# Patient Record
Sex: Male | Born: 1975 | Race: White | Hispanic: No | Marital: Married | Smoking: Never smoker
Health system: Southern US, Community
[De-identification: ages and names within clinical notes are randomized; demographics above are authoritative.]

## PROBLEM LIST (undated history)

## (undated) DIAGNOSIS — N200 Calculus of kidney: Secondary | ICD-10-CM

## (undated) DIAGNOSIS — R51 Headache: Secondary | ICD-10-CM

## (undated) DIAGNOSIS — F419 Anxiety disorder, unspecified: Secondary | ICD-10-CM

## (undated) DIAGNOSIS — F32A Depression, unspecified: Secondary | ICD-10-CM

## (undated) DIAGNOSIS — Z87442 Personal history of urinary calculi: Secondary | ICD-10-CM

## (undated) DIAGNOSIS — F329 Major depressive disorder, single episode, unspecified: Secondary | ICD-10-CM

## (undated) DIAGNOSIS — G4730 Sleep apnea, unspecified: Secondary | ICD-10-CM

## (undated) DIAGNOSIS — I10 Essential (primary) hypertension: Secondary | ICD-10-CM

## (undated) DIAGNOSIS — E785 Hyperlipidemia, unspecified: Secondary | ICD-10-CM

## (undated) DIAGNOSIS — F988 Other specified behavioral and emotional disorders with onset usually occurring in childhood and adolescence: Secondary | ICD-10-CM

## (undated) DIAGNOSIS — R519 Headache, unspecified: Secondary | ICD-10-CM

## (undated) DIAGNOSIS — E039 Hypothyroidism, unspecified: Secondary | ICD-10-CM

## (undated) HISTORY — PX: HERNIA REPAIR: SHX51

## (undated) HISTORY — DX: Other specified behavioral and emotional disorders with onset usually occurring in childhood and adolescence: F98.8

## (undated) HISTORY — PX: WISDOM TOOTH EXTRACTION: SHX21

## (undated) HISTORY — PX: NASAL SINUS SURGERY: SHX719

---
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
## 2001-04-25 ENCOUNTER — Ambulatory Visit (HOSPITAL_BASED_OUTPATIENT_CLINIC_OR_DEPARTMENT_OTHER): Admission: RE | Admit: 2001-04-25 | Discharge: 2001-04-25 | Payer: Self-pay | Admitting: *Deleted

## 2001-09-03 ENCOUNTER — Encounter: Payer: Self-pay | Admitting: Emergency Medicine

## 2001-09-03 ENCOUNTER — Emergency Department (HOSPITAL_COMMUNITY): Admission: EM | Admit: 2001-09-03 | Discharge: 2001-09-03 | Payer: Self-pay | Admitting: Emergency Medicine

## 2004-03-06 ENCOUNTER — Ambulatory Visit: Payer: Self-pay | Admitting: Internal Medicine

## 2004-12-12 ENCOUNTER — Ambulatory Visit: Payer: Self-pay | Admitting: Internal Medicine

## 2005-02-17 ENCOUNTER — Ambulatory Visit: Payer: Self-pay | Admitting: Internal Medicine

## 2005-08-14 ENCOUNTER — Ambulatory Visit: Payer: Self-pay | Admitting: Internal Medicine

## 2005-08-28 ENCOUNTER — Ambulatory Visit: Payer: Self-pay | Admitting: Internal Medicine

## 2005-09-24 ENCOUNTER — Ambulatory Visit: Payer: Self-pay | Admitting: Internal Medicine

## 2006-08-17 ENCOUNTER — Ambulatory Visit: Payer: Self-pay | Admitting: Internal Medicine

## 2006-08-18 LAB — CONVERTED CEMR LAB
Basophils Absolute: 0 10*3/uL (ref 0.0–0.1)
Basophils Relative: 0.4 % (ref 0.0–1.0)
Eosinophils Absolute: 0.1 10*3/uL (ref 0.0–0.6)
Eosinophils Relative: 0.9 % (ref 0.0–5.0)
Free T4: 0.8 ng/dL (ref 0.6–1.6)
HCT: 46.4 % (ref 39.0–52.0)
Hemoglobin: 15.9 g/dL (ref 13.0–17.0)
Hgb A1c MFr Bld: 5.3 % (ref 4.6–6.0)
Lymphocytes Relative: 38.7 % (ref 12.0–46.0)
MCHC: 34.4 g/dL (ref 30.0–36.0)
MCV: 88.3 fL (ref 78.0–100.0)
Monocytes Absolute: 0.8 10*3/uL — ABNORMAL HIGH (ref 0.2–0.7)
Monocytes Relative: 9.9 % (ref 3.0–11.0)
Neutro Abs: 4.1 10*3/uL (ref 1.4–7.7)
Neutrophils Relative %: 50.1 % (ref 43.0–77.0)
Platelets: 345 10*3/uL (ref 150–400)
RBC: 5.25 M/uL (ref 4.22–5.81)
RDW: 12.5 % (ref 11.5–14.6)
T3 Uptake Ratio: 41.7 % — ABNORMAL HIGH (ref 22.5–37.0)
T4, Total: 7.3 ug/dL (ref 5.0–12.5)
TSH: 1.9 microintl units/mL (ref 0.35–5.50)
WBC: 8.2 10*3/uL (ref 4.5–10.5)

## 2006-09-29 ENCOUNTER — Ambulatory Visit: Payer: Self-pay | Admitting: Internal Medicine

## 2007-12-13 ENCOUNTER — Telehealth: Payer: Self-pay | Admitting: Internal Medicine

## 2008-01-10 ENCOUNTER — Encounter: Payer: Self-pay | Admitting: Internal Medicine

## 2008-02-08 ENCOUNTER — Ambulatory Visit (HOSPITAL_BASED_OUTPATIENT_CLINIC_OR_DEPARTMENT_OTHER): Admission: RE | Admit: 2008-02-08 | Discharge: 2008-02-08 | Payer: Self-pay | Admitting: Otolaryngology

## 2008-02-11 ENCOUNTER — Ambulatory Visit: Payer: Self-pay | Admitting: Internal Medicine

## 2008-04-11 ENCOUNTER — Encounter: Payer: Self-pay | Admitting: Internal Medicine

## 2008-07-02 ENCOUNTER — Encounter: Payer: Self-pay | Admitting: *Deleted

## 2008-07-02 ENCOUNTER — Telehealth: Payer: Self-pay | Admitting: *Deleted

## 2009-02-11 ENCOUNTER — Encounter (INDEPENDENT_AMBULATORY_CARE_PROVIDER_SITE_OTHER): Payer: Self-pay | Admitting: *Deleted

## 2009-02-12 ENCOUNTER — Ambulatory Visit: Payer: Self-pay | Admitting: Internal Medicine

## 2009-02-12 LAB — CONVERTED CEMR LAB
ALT: 24 units/L (ref 0–53)
AST: 22 units/L (ref 0–37)
Albumin: 4.4 g/dL (ref 3.5–5.2)
Alkaline Phosphatase: 56 units/L (ref 39–117)
BUN: 19 mg/dL (ref 6–23)
Basophils Absolute: 0 10*3/uL (ref 0.0–0.1)
Basophils Relative: 0.6 % (ref 0.0–3.0)
Bilirubin, Direct: 0 mg/dL (ref 0.0–0.3)
Blood in Urine, dipstick: NEGATIVE
CO2: 27 meq/L (ref 19–32)
Calcium: 9 mg/dL (ref 8.4–10.5)
Chloride: 108 meq/L (ref 96–112)
Cholesterol: 203 mg/dL — ABNORMAL HIGH (ref 0–200)
Creatinine, Ser: 1 mg/dL (ref 0.4–1.5)
Direct LDL: 162.1 mg/dL
Eosinophils Absolute: 0 10*3/uL (ref 0.0–0.7)
Eosinophils Relative: 0.7 % (ref 0.0–5.0)
GFR calc non Af Amer: 92.61 mL/min (ref >60)
Glucose, Bld: 88 mg/dL (ref 70–99)
Glucose, Urine, Semiquant: NEGATIVE
HCT: 44.9 % (ref 39.0–52.0)
HDL: 28.5 mg/dL — ABNORMAL LOW (ref >39.00)
Hemoglobin: 15.2 g/dL (ref 13.0–17.0)
Lymphocytes Relative: 40.7 % (ref 12.0–46.0)
Lymphs Abs: 2.6 10*3/uL (ref 0.7–4.0)
MCHC: 33.8 g/dL (ref 30.0–36.0)
MCV: 91.6 fL (ref 78.0–100.0)
Monocytes Absolute: 0.6 10*3/uL (ref 0.1–1.0)
Monocytes Relative: 10.1 % (ref 3.0–12.0)
Neutro Abs: 3.1 10*3/uL (ref 1.4–7.7)
Neutrophils Relative %: 47.9 % (ref 43.0–77.0)
Nitrite: NEGATIVE
Platelets: 252 10*3/uL (ref 150.0–400.0)
Potassium: 4.5 meq/L (ref 3.5–5.1)
RBC: 4.9 M/uL (ref 4.22–5.81)
RDW: 12.2 % (ref 11.5–14.6)
Sodium: 144 meq/L (ref 135–145)
Specific Gravity, Urine: >=1.03
TSH: 1.81 microintl units/mL (ref 0.35–5.50)
Total Bilirubin: 1.1 mg/dL (ref 0.3–1.2)
Total CHOL/HDL Ratio: 7
Total Protein: 6.8 g/dL (ref 6.0–8.3)
Triglycerides: 110 mg/dL (ref 0.0–149.0)
Urobilinogen, UA: 0.2
VLDL: 22 mg/dL (ref 0.0–40.0)
WBC Urine, dipstick: NEGATIVE
WBC: 6.3 10*3/uL (ref 4.5–10.5)
pH: 5.5

## 2009-02-18 ENCOUNTER — Ambulatory Visit: Payer: Self-pay | Admitting: Internal Medicine

## 2009-02-28 ENCOUNTER — Encounter (INDEPENDENT_AMBULATORY_CARE_PROVIDER_SITE_OTHER): Payer: Self-pay | Admitting: *Deleted

## 2009-03-06 ENCOUNTER — Ambulatory Visit (HOSPITAL_COMMUNITY): Admission: RE | Admit: 2009-03-06 | Discharge: 2009-03-08 | Payer: Self-pay | Admitting: Otolaryngology

## 2009-03-15 ENCOUNTER — Encounter (INDEPENDENT_AMBULATORY_CARE_PROVIDER_SITE_OTHER): Payer: Self-pay | Admitting: *Deleted

## 2009-04-05 ENCOUNTER — Encounter: Payer: Self-pay | Admitting: Internal Medicine

## 2009-05-14 ENCOUNTER — Encounter: Payer: Self-pay | Admitting: Internal Medicine

## 2010-02-17 ENCOUNTER — Encounter: Payer: Self-pay | Admitting: Internal Medicine

## 2010-03-28 ENCOUNTER — Emergency Department (HOSPITAL_COMMUNITY)
Admission: EM | Admit: 2010-03-28 | Discharge: 2010-03-28 | Payer: Self-pay | Source: Home / Self Care | Admitting: Emergency Medicine

## 2010-04-29 NOTE — Letter (Signed)
Summary: [facility]  [facility]   Imported By: Maryln Gottron 04/10/2009 12:53:00  _____________________________________________________________________  External Attachment:    Type:   Image     Comment:   External Document

## 2010-04-29 NOTE — Letter (Signed)
Summary: [place]  [facility] Provider: This provider was preselected by the workflow.  Signature: The signature status of this document was preset by the workflow  Processed by [employer] Local Indexer Client @ Thursday, February 14, 2009 2:03:55 PM using version:2010.1.2.11(2.4)   Manually Indexed By: [id number]  idlBatchDetail: [id number]   _____________________________________________________________________  External Attachment:    Type:   Image     Comment:   External Document

## 2010-04-29 NOTE — Progress Notes (Signed)
Summary: Cut tip of finger/Tetnus  Phone Note Call from Patient Call back at [phone]   Caller: Patient Call For: Lovell Sheehan Reason for Call: Acute Illness, Talk to Nurse, Insurance Question Summary of Call: Yesterday patient cut the tip of his finger off and is in alot of pain and discomfort.  Wants to be evaluated and believes he needs a Tetnus shot. Initial call taken by: Barnie Mort,  July 02, 2008 9:46 AM  Follow-up for Phone Call        tetanus shot is up to date 2003--called pt - left message on machine how much was cut off- doe3s he need to go to hand surgeon or will cleaning and taping be ok- since over 24 hours nothing we can do.  Follow-up by: Willy Eddy, LPN,  July 02, 2008 10:16 AM

## 2010-04-29 NOTE — Letter (Signed)
Summary: [facility]  [facility]   Imported By: Maryln Gottron 03/06/2010 13:13:42  _____________________________________________________________________  External Attachment:    Type:   Image     Comment:   External Document

## 2010-04-29 NOTE — Miscellaneous (Signed)
Summary: Orders Update  Clinical Lists Changes  Observations: Added new observation of TD BOOSTER: Historical (07/01/2001 10:17)         Immunization History:  Tetanus/Td Immunization History:    Tetanus/Td:  historical (07/01/2001)

## 2010-04-29 NOTE — Assessment & Plan Note (Signed)
Summary: cpx/cjr   Vital Signs:  Patient profile:   35 year old male Height:      74 inches Weight:      304 pounds BMI:     39.17 Temp:     98.2 degrees F oral Pulse rate:   68 / minute Resp:     14 per minute BP sitting:   130 / 80  (left arm) Cuff size:   large  Vitals Entered By: Willy Eddy, LPN (February 18, 2009 3:22 PM)  CC:  cpx.  History of Present Illness: The pt was asked about all immunizations, health maint. services that are appropriate to their age and was given guidance on diet exercize  and weight management   Preventive Screening-Counseling & Management  Alcohol-Tobacco     Smoking Status: never  Caffeine-Diet-Exercise     Does Patient Exercise: no      Drug Use:  no.    Problems Prior to Update: 1)  Health Screening  (ICD-V70.0)  Medications Prior to Update: 1)  Alprazolam 0.25 Mg Tabs (Alprazolam) .... One By Mouth Three Times A Day As Needed  Current Medications (verified): 1)  Alprazolam 0.25 Mg Tabs (Alprazolam) .... One By Mouth Three Times A Day As Needed  Past History:  Past medical, surgical, family and social histories (including risk factors) reviewed, and no changes noted (except as noted below).  Family History: Reviewed history and no changes required. Family History High cholesterol  Social History: Reviewed history and no changes required. Married Never Smoked Drug use-no Regular exercise-no Smoking Status:  never Drug Use:  no Does Patient Exercise:  no  Review of Systems  The patient denies anorexia, fever, weight loss, weight gain, vision loss, decreased hearing, hoarseness, chest pain, syncope, dyspnea on exertion, peripheral edema, prolonged cough, headaches, hemoptysis, abdominal pain, melena, hematochezia, severe indigestion/heartburn, hematuria, incontinence, genital sores, muscle weakness, suspicious skin lesions, transient blindness, difficulty walking, depression, unusual weight change, abnormal  bleeding, enlarged lymph nodes, angioedema, breast masses, and testicular masses.    Contraindications/Deferment of Procedures/Staging:    Test/Procedure: FLU VAX    Reason for deferment: patient declined   Physical Exam  General:  Well-developed,well-nourished,in no acute distress; alert,appropriate and cooperative throughout examination Head:  normocephalic and male-pattern balding.   Eyes:  No corneal or conjunctival inflammation noted. EOMI. Perrla. Funduscopic exam benign, without hemorrhages, exudates or papilledema. Vision grossly normal. Ears:  External ear exam shows no significant lesions or deformities.  Otoscopic examination reveals clear canals, tympanic membranes are intact bilaterally without bulging, retraction, inflammation or discharge. Hearing is grossly normal bilaterally. Nose:  no external deformity and no nasal discharge.   Mouth:  good dentition and pharynx pink and moist.   Neck:  No deformities, masses, or tenderness noted. Breasts:  gynecomastia.   Lungs:  no intercostal retractions and no accessory muscle use.   Heart:  normal rate and regular rhythm.   Abdomen:  Bowel sounds positive,abdomen soft and non-tender without masses, organomegaly or hernias noted. Genitalia:  circumcised and no urethral discharge.   Prostate:  no gland enlargement and no asymmetry.   Msk:  normal ROM, no joint tenderness, no joint swelling, and no joint warmth.   Extremities:  No clubbing, cyanosis, edema, or deformity noted with normal full range of motion of all joints.   Neurologic:  No cranial nerve deficits noted. Station and gait are normal. Plantar reflexes are down-going bilaterally. DTRs are symmetrical throughout. Sensory, motor and coordinative functions appear intact.   Impression &  Recommendations:  Problem # 1:  Preventive Health Care (ICD-V70.0) The pt was asked about all immunizations, health maint. services that are appropriate to their age and was given guidance  on diet exercize  and weight management  Td Booster: Historical (07/01/2001)   Chol: 203 (02/12/2009)   HDL: 28.50 (02/12/2009)   TG: 110.0 (02/12/2009) TSH: 1.81 (02/12/2009)   HgbA1C: 5.3 (08/18/2006)    Discussed using sunscreen, use of alcohol, drug use, self testicular exam, routine dental care, routine eye care, routine physical exam, seat belts, multiple vitamins, osteoporosis prevention, adequate calcium intake in diet, and recommendations for immunizations.  Discussed exercise and checking cholesterol.  Discussed gun safety, safe sex, and contraception. Also recommend checking PSA.  Complete Medication List: 1)  Alprazolam 0.25 Mg Tabs (Alprazolam) .... One by mouth three times a day as needed  Other Orders: EKG w/ Interpretation (93000)  Patient Instructions: 1)  weigtht loss 2)  red rice yeast suppliment 3)  add fiber to diet and cut out some of the  carbs and fat 4)  Please schedule a follow-up appointment in 6 months. 5)  Hepatic Panel prior to visit, ICD-9:995.20 6)  Lipid Panel prior to visit, ICD-9:272.4 7)  dash diet [email or web address]

## 2010-04-29 NOTE — Letter (Signed)
Summary: [facility]  [facility] Provider: This provider was preselected by the workflow.  Signature: The signature status of this document was preset by the workflow  Processed by [employer] Local Indexer Client @ Wednesday, March 20, 2009 10:57:28 AM using version:2010.1.2.11(2.4)   Manually Indexed By: [id number]  idlBatchDetail: [id number]   _____________________________________________________________________  External Attachment:    Type:   Image     Comment:   External Document

## 2010-04-29 NOTE — Consult Note (Signed)
Summary: [facility]  [facility]   Imported By: Maryln Gottron 04/18/2008 14:40:55  _____________________________________________________________________  External Attachment:    Type:   Image     Comment:   External Document

## 2010-04-29 NOTE — Letter (Signed)
Summary: [place] [facility]  [facility] Provider: This provider was preselected by the workflow.  Signature: The signature status of this document was preset by the workflow  Processed by [employer] Local Indexer Client @ Thursday, March 07, 2009 10:04:42 AM using version:2010.1.2.11(2.4)   Manually Indexed By: [id number]  idlBatchDetail: [id number]   _____________________________________________________________________  External Attachment:    Type:   Image     Comment:   External Document

## 2010-04-29 NOTE — Letter (Signed)
Summary: [facility]  [facility]   Imported By: Maryln Gottron 05/21/2009 10:57:57  _____________________________________________________________________  External Attachment:    Type:   Image     Comment:   External Document

## 2010-05-14 ENCOUNTER — Ambulatory Visit (INDEPENDENT_AMBULATORY_CARE_PROVIDER_SITE_OTHER): Payer: 59 | Admitting: Internal Medicine

## 2010-05-14 ENCOUNTER — Encounter: Payer: Self-pay | Admitting: Internal Medicine

## 2010-05-14 VITALS — BP 130/80 | HR 76 | Temp 98.8°F | Resp 14 | Ht 75.0 in | Wt 250.0 lb

## 2010-05-14 DIAGNOSIS — J0110 Acute frontal sinusitis, unspecified: Secondary | ICD-10-CM

## 2010-05-14 DIAGNOSIS — J0100 Acute maxillary sinusitis, unspecified: Secondary | ICD-10-CM

## 2010-05-14 DIAGNOSIS — J069 Acute upper respiratory infection, unspecified: Secondary | ICD-10-CM

## 2010-05-14 MED ORDER — LEVOFLOXACIN 500 MG PO TABS: 500.0000 mg | ORAL_TABLET | Freq: Every day | ORAL | Status: DC

## 2010-05-14 MED ORDER — LEVOFLOXACIN 500 MG PO TABS: 500.0000 mg | ORAL_TABLET | Freq: Every day | ORAL | Status: AC

## 2010-05-14 MED ORDER — METHYLPREDNISOLONE (PAK) 4 MG PO TABS: 4.0000 mg | ORAL_TABLET | Freq: Every day | ORAL | Status: DC

## 2010-05-14 MED ORDER — SALINE SPRAY 0.65 % NA SOLN: 1.0000 | NASAL | Status: AC | PRN

## 2010-05-14 NOTE — Progress Notes (Signed)
  Subjective:     Robert [name] is a 35 y.o. male who presents for evaluation of symptoms of a URI, follow up on a URI. Symptoms include lightheadedness, non productive cough and productive cough with  yellow colored sputum. Onset of symptoms was 4 months ago, and has been gradually worsening since that time. Treatment to date: antibiotics, antihistamines, cough suppressants and decongestants.  The following portions of the patient's history were reviewed and updated as appropriate: allergies, current medications, past family history, past medical history, past social history, past surgical history and problem list.  Review of Systems Constitutional: negative Eyes: positive for irritation, redness and visual disturbance Ears, nose, mouth, throat, and face: positive for ear drainage, hoarseness, nasal congestion and voice change Respiratory: positive for cough and sputum Gastrointestinal: positive for constipation Allergic/Immunologic: positive for hay fever   Objective:    General appearance: alert, mild distress and mildly obese Eyes: positive findings: eyelids/periorbital: normal Ears: normal TM's and external ear canals both ears Nose: Nares normal. Septum midline. Mucosa normal. No drainage or sinus tenderness., turbinates red, edematous, inflamed, no sinus tenderness, sinus tenderness bilateral Throat: lips, mucosa, and tongue normal; teeth and gums normal Lungs: clear to auscultation bilaterally Abdomen: soft, non-tender; bowel sounds normal; no masses,  no organomegaly Skin: Skin color, texture, turgor normal. No rashes or lesions Neurologic: Grossly normal   Assessment:    sinusitis   Plan:    Discussed the diagnosis and treatment of sinusitis. Suggested symptomatic OTC remedies. Nasal saline spray for congestion. Nasal steroids per orders. Follow up as needed.

## 2010-06-09 LAB — POCT I-STAT, CHEM 8
BUN: 15 mg/dL (ref 6–23)
Calcium, Ion: 1.1 mmol/L — ABNORMAL LOW (ref 1.12–1.32)
Chloride: 108 mEq/L (ref 96–112)
Creatinine, Ser: 1.1 mg/dL (ref 0.4–1.5)
Glucose, Bld: 96 mg/dL (ref 70–99)
HCT: 45 % (ref 39.0–52.0)
Hemoglobin: 15.3 g/dL (ref 13.0–17.0)
Potassium: 4.1 mEq/L (ref 3.5–5.1)
Sodium: 141 mEq/L (ref 135–145)
TCO2: 23 mmol/L (ref 0–100)

## 2010-07-01 LAB — CBC
HCT: 44.9 % (ref 39.0–52.0)
Hemoglobin: 15.7 g/dL (ref 13.0–17.0)
MCHC: 34.9 g/dL (ref 30.0–36.0)
MCV: 88.5 fL (ref 78.0–100.0)
Platelets: 288 10*3/uL (ref 150–400)
RBC: 5.07 MIL/uL (ref 4.22–5.81)
RDW: 12.9 % (ref 11.5–15.5)
WBC: 6.7 10*3/uL (ref 4.0–10.5)

## 2010-07-01 LAB — BASIC METABOLIC PANEL
BUN: 15 mg/dL (ref 6–23)
CO2: 25 mEq/L (ref 19–32)
Calcium: 9.3 mg/dL (ref 8.4–10.5)
Chloride: 107 mEq/L (ref 96–112)
Creatinine, Ser: 1.05 mg/dL (ref 0.4–1.5)
GFR calc Af Amer: >60 mL/min (ref >60)
GFR calc non Af Amer: >60 mL/min (ref >60)
Glucose, Bld: 91 mg/dL (ref 70–99)
Potassium: 4.5 mEq/L (ref 3.5–5.1)
Sodium: 139 mEq/L (ref 135–145)

## 2010-07-07 ENCOUNTER — Encounter: Payer: Self-pay | Admitting: Internal Medicine

## 2010-07-07 ENCOUNTER — Telehealth: Payer: Self-pay | Admitting: *Deleted

## 2010-07-07 ENCOUNTER — Ambulatory Visit (INDEPENDENT_AMBULATORY_CARE_PROVIDER_SITE_OTHER): Payer: 59 | Admitting: Internal Medicine

## 2010-07-07 VITALS — BP 120/80 | HR 72 | Temp 98.5°F | Wt 294.0 lb

## 2010-07-07 DIAGNOSIS — R197 Diarrhea, unspecified: Secondary | ICD-10-CM

## 2010-07-07 NOTE — Progress Notes (Signed)
  Subjective:    Patient ID: Robert [name], male    DOB: 08-09-1975, 35 y.o.   MRN: [id number]  HPI Patient comes in today for an acute visit. He had a very stressful day at work on April 4 and soon after that began having stomach issues however on April 6 had fever chills probable fever and rigers and associated profuse,  frequent watery diarrhea without blood. He had some abdominal cramps but otherwise no pain nausea no vomiting. No known exposures travel or undercooked food.  Since that time he has taken Imodium for the last couple days one yesterday and one today and his stool frequency has significantly decreased. He was able to eat a [place] sandwich and some rice today without significant side effects. No cure and fever and appetite is better.  Treated in late February for respiratory infection with Levaquin. Otherwise the recent antibiotics. 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  Review of Systems No chest pain shortness of breath current cough changes in skin blood in stool. Rest as per history of present illness.    Objective:   Physical Exam Well-developed well-nourished in no acute distress HEENT is grossly normal he is nonicteric. Neck no masses or tenderness Chest:  Clear to A&P without wheezes rales or rhonchi CV:  S1-S2 no gallops or murmurs peripheral perfusion is normal Abdomen:  Sof,t normal bowel sounds without hepatosplenomegaly, no guarding rebound or masses no CVA tenderness. Skin no acute changes rashes or icterus      Assessment & Plan:  Diarrhea  Most likely infectious acute enteritis that is improving. No alarm features except that he was on Levaquin in the end of February. But he is in proving his situation at present will not get stool tests but low threshold to get stool tests to check for C. difficile etc. Expectant management and symptomatic treatment.  Patient aware to call back if not continuing to improve or are persistent and progressive  symptoms next week.

## 2010-07-07 NOTE — Telephone Encounter (Signed)
Appt made

## 2010-07-07 NOTE — Patient Instructions (Signed)
This acts like an acute gastroenteritis most likely from a virus could be bacterial.  Expect some bowel irritability over the next week and then resolution of your symptoms. The Imodium is okay to take as needed but does not cure the problem.  If you are relapsing fever or blood in her stool or very severe symptoms call and we will plan to do stool cultures and tests.   Avoid fruit juices high sugar content foods.

## 2010-08-12 NOTE — Procedures (Signed)
NAME:  Robert [name], Robert [name]              ACCOUNT NO.:  [id number]   MEDICAL RECORD NO.:  [id number]          PATIENT TYPE:  OUT   LOCATION:  SLEEP CENTER                 FACILITY:  [facility]   PHYSICIAN:  Clinton D. Maple Hudson, MD, FCCP, FACPDATE OF BIRTH:  1977/10/25   DATE OF STUDY:  02/08/2008                            NOCTURNAL POLYSOMNOGRAM   REFERRING PHYSICIAN:  Zola Button T. Lazarus Salines, M.D.   INDICATION FOR STUDY:  Hypersomnia with sleep apnea.   EPWORTH SLEEPINESS SCORE:  Epworth sleepiness score 16/24.  BMI 35.  Weight 280 pounds.  Height 75 inches.  Neck 18 inches.   MEDICATIONS:  Home medications listed as none.   SLEEP ARCHITECTURE:  A baseline diagnostic NPSG on April 25, 2001, had  recorded an AHI of 27 per hour.  During the diagnostic phase, total  sleep time was 431 minutes with sleep efficiency 75.8%.  Stage I was  5.7%.  Stage II 94.3%.  Stage III and REM were absent.  Sleep latency 32  minutes.  Wake after sleep onset 7.5 minutes.  Arousal index 32.4.  No  bedtime medication was taken.   RESPIRATORY DATA:  Split-study protocol.  Apnea-hypopnea index (AHI)  70.7 per hour.  A total of 155 events were scored including 60  obstructive apneas, 12 central apneas, 8 mixed apneas, and 75 hypopneas  before CPAP titration.  Events were present in all sleep positions, but  more common while supine.  REM AHI 0/NA.  CPAP was titrated to 15 CWP,  AHI 0 per hour.  He chose a medium ResMed Mirage Quattro full face mask  with heated humidifier.   OXYGEN DATA:  Moderate snoring with oxygen desaturation to a nadir of  84% before CPAP.  After the CPAP control, mean oxygen saturation held  96.4% on room air.   CARDIAC DATA:  Normal sinus rhythm.   MOVEMENT-PARASOMNIA:  No significant movement disturbances.  No bathroom  trips.   IMPRESSIONS-RECOMMENDATIONS:  1. Severe obstructive sleep apnea/hypopnea syndrome, AHI 70.7 per hour      with events more common while supine, but present in all  positions.      Moderate snoring with oxygen desaturation to a nadir of 84%.  2. Successful CPAP titration to 15 CWP, AHI 0 per hour.  He chose a      medium ResMed Mirage Quattro full face      mask with heated humidifier.  3. A baseline diagnostic study was recorded on April 25, 2001, AHI      27 per hour.      Clinton D. Maple Hudson, MD, [facility], FACP  Diplomate, Biomedical engineer of Sleep Medicine  Electronically Signed     CDY/MEDQ  D:  02/11/2008 16:02:15  T:  02/12/2008 03:51:15  Job:  [id number]

## 2010-10-29 ENCOUNTER — Other Ambulatory Visit: Payer: 59

## 2010-10-30 ENCOUNTER — Other Ambulatory Visit (INDEPENDENT_AMBULATORY_CARE_PROVIDER_SITE_OTHER): Payer: 59

## 2010-10-30 DIAGNOSIS — Z0000 Encounter for general adult medical examination without abnormal findings: Secondary | ICD-10-CM

## 2010-10-30 LAB — HEPATIC FUNCTION PANEL
ALT: 20 U/L (ref 0–53)
AST: 17 U/L (ref 0–37)
Albumin: 4.8 g/dL (ref 3.5–5.2)
Alkaline Phosphatase: 52 U/L (ref 39–117)
Bilirubin, Direct: 0 mg/dL (ref 0.0–0.3)
Total Bilirubin: 0.7 mg/dL (ref 0.3–1.2)
Total Protein: 6.9 g/dL (ref 6.0–8.3)

## 2010-10-30 LAB — POCT URINALYSIS DIPSTICK
Bilirubin, UA: NEGATIVE
Blood, UA: NEGATIVE
Glucose, UA: NEGATIVE
Ketones, UA: NEGATIVE
Leukocytes, UA: NEGATIVE
Nitrite, UA: NEGATIVE
Protein, UA: NEGATIVE
Spec Grav, UA: 1.025
Urobilinogen, UA: 0.2
pH, UA: 5

## 2010-10-30 LAB — LIPID PANEL
Cholesterol: 191 mg/dL (ref 0–200)
HDL: 37.7 mg/dL — ABNORMAL LOW (ref >39.00)
LDL Cholesterol: 134 mg/dL — ABNORMAL HIGH (ref 0–99)
Total CHOL/HDL Ratio: 5
Triglycerides: 98 mg/dL (ref 0.0–149.0)
VLDL: 19.6 mg/dL (ref 0.0–40.0)

## 2010-10-30 LAB — CBC WITH DIFFERENTIAL/PLATELET
Basophils Absolute: 0 10*3/uL (ref 0.0–0.1)
Basophils Relative: 0.6 % (ref 0.0–3.0)
Eosinophils Absolute: 0 10*3/uL (ref 0.0–0.7)
Eosinophils Relative: 0.6 % (ref 0.0–5.0)
HCT: 46.7 % (ref 39.0–52.0)
Hemoglobin: 15.5 g/dL (ref 13.0–17.0)
Lymphocytes Relative: 44 % (ref 12.0–46.0)
Lymphs Abs: 3.2 10*3/uL (ref 0.7–4.0)
MCHC: 33.2 g/dL (ref 30.0–36.0)
MCV: 88.9 fl (ref 78.0–100.0)
Monocytes Absolute: 0.7 10*3/uL (ref 0.1–1.0)
Monocytes Relative: 9.2 % (ref 3.0–12.0)
Neutro Abs: 3.3 10*3/uL (ref 1.4–7.7)
Neutrophils Relative %: 45.6 % (ref 43.0–77.0)
Platelets: 284 10*3/uL (ref 150.0–400.0)
RBC: 5.25 Mil/uL (ref 4.22–5.81)
RDW: 13.3 % (ref 11.5–14.6)
WBC: 7.2 10*3/uL (ref 4.5–10.5)

## 2010-10-30 LAB — BASIC METABOLIC PANEL
BUN: 19 mg/dL (ref 6–23)
CO2: 24 mEq/L (ref 19–32)
Calcium: 9.1 mg/dL (ref 8.4–10.5)
Chloride: 105 mEq/L (ref 96–112)
Creatinine, Ser: 1 mg/dL (ref 0.4–1.5)
GFR: 89.54 mL/min (ref >60.00)
Glucose, Bld: 83 mg/dL (ref 70–99)
Potassium: 4.1 mEq/L (ref 3.5–5.1)
Sodium: 141 mEq/L (ref 135–145)

## 2010-10-30 LAB — TSH: TSH: 2.81 u[IU]/mL (ref 0.35–5.50)

## 2010-11-05 ENCOUNTER — Encounter: Payer: Self-pay | Admitting: Internal Medicine

## 2010-11-05 ENCOUNTER — Ambulatory Visit (INDEPENDENT_AMBULATORY_CARE_PROVIDER_SITE_OTHER): Payer: 59 | Admitting: Internal Medicine

## 2010-11-05 DIAGNOSIS — F411 Generalized anxiety disorder: Secondary | ICD-10-CM

## 2010-11-05 DIAGNOSIS — Z0000 Encounter for general adult medical examination without abnormal findings: Secondary | ICD-10-CM

## 2010-11-05 DIAGNOSIS — F419 Anxiety disorder, unspecified: Secondary | ICD-10-CM

## 2010-11-05 MED ORDER — ALPRAZOLAM 0.25 MG PO TABS: 0.2500 mg | ORAL_TABLET | Freq: Three times a day (TID) | ORAL | Status: DC | PRN

## 2010-11-05 NOTE — Progress Notes (Signed)
  Subjective:    Patient ID: Robert [name], male    DOB: 1976/01/02, 35 y.o.   MRN: [id number]  HPI  welness  Review of Systems  Constitutional: Negative for fever and fatigue.  HENT: Negative for hearing loss, congestion, neck pain and postnasal drip.   Eyes: Negative for discharge, redness and visual disturbance.  Respiratory: Negative for cough, shortness of breath and wheezing.   Cardiovascular: Negative for leg swelling.  Gastrointestinal: Negative for abdominal pain, constipation and abdominal distention.  Genitourinary: Negative for urgency and frequency.  Musculoskeletal: Negative for joint swelling and arthralgias.  Skin: Negative for color change and rash.  Neurological: Negative for weakness and light-headedness.  Hematological: Negative for adenopathy.  Psychiatric/Behavioral: Negative for behavioral problems.   Past Medical History  Diagnosis Date  . Chronic sinusitis    Past Surgical History  Procedure Date  . Nasal sinus surgery     reports that he has never smoked. He does not have any smokeless tobacco history on file. He reports that he drinks alcohol. He reports that he does not use illicit drugs. family history includes Hyperlipidemia in an unspecified family member and Hypertension in his mother. Allergies  Allergen Reactions  . Penicillins Anaphylaxis       Objective:   Physical Exam  Nursing note and vitals reviewed. Constitutional: He appears well-developed and well-nourished.  HENT:  Head: Normocephalic and atraumatic.  Eyes: Conjunctivae are normal. Pupils are equal, round, and reactive to light.  Neck: Normal range of motion. Neck supple.  Cardiovascular: Normal rate and regular rhythm.   Pulmonary/Chest: Effort normal and breath sounds normal.  Abdominal: Soft. Bowel sounds are normal.  Musculoskeletal: Normal range of motion.  Neurological: He is alert.  Skin: Skin is warm and dry.  Psychiatric: His behavior is normal.            Assessment & Plan:   Patient presents for yearly preventative medicine examination.   all immunizations and health maintenance protocols were reviewed with the patient and they are up to date with these protocols.   screening laboratory values were reviewed with the patient including screening of hyperlipidemia PSA renal function and hepatic function.   There medications past medical history social history problem list and allergies were reviewed in detail.   Goals were established with regard to weight loss exercise diet in compliance with medications

## 2011-03-04 ENCOUNTER — Telehealth: Payer: Self-pay | Admitting: Internal Medicine

## 2011-03-04 MED ORDER — PHENTERMINE HCL 37.5 MG PO TABS: 37.5000 mg | ORAL_TABLET | Freq: Every day | ORAL | Status: AC

## 2011-03-04 NOTE — Telephone Encounter (Signed)
Pt called and has sch an ov for 04/22/11 for med check/refill ov. Pt is going to need a refill of Phentermine 37.5 mg half a tab qd to [facility] on [address], to last until pts ov in January.   Also pt is needing to get script for Topiramate sent over to [facility] as well. Pt said that Dr Lovell Sheehan recommended this med for pt during an ov that pts mom had with Dr Lovell Sheehan.

## 2011-03-04 NOTE — Telephone Encounter (Signed)
Ok to call in per dr jenkins-pt informed

## 2011-04-06 ENCOUNTER — Telehealth: Payer: Self-pay | Admitting: Internal Medicine

## 2011-04-06 NOTE — Telephone Encounter (Signed)
Pt has ov sch for 04-22-2011. Pt would like to come in sooner for med check.

## 2011-04-06 NOTE — Telephone Encounter (Signed)
Pt informed none available- will call if anything becomes available

## 2011-04-22 ENCOUNTER — Ambulatory Visit: Payer: 59 | Admitting: Internal Medicine

## 2011-04-24 ENCOUNTER — Encounter: Payer: Self-pay | Admitting: Internal Medicine

## 2011-04-24 ENCOUNTER — Ambulatory Visit (INDEPENDENT_AMBULATORY_CARE_PROVIDER_SITE_OTHER): Payer: 59 | Admitting: Internal Medicine

## 2011-04-24 VITALS — BP 130/80 | HR 76 | Temp 98.2°F | Resp 16 | Ht 75.0 in | Wt 282.0 lb

## 2011-04-24 DIAGNOSIS — R635 Abnormal weight gain: Secondary | ICD-10-CM

## 2011-04-24 DIAGNOSIS — Z202 Contact with and (suspected) exposure to infections with a predominantly sexual mode of transmission: Secondary | ICD-10-CM

## 2011-04-24 DIAGNOSIS — E669 Obesity, unspecified: Secondary | ICD-10-CM

## 2011-04-24 MED ORDER — TOPIRAMATE 25 MG PO TABS: 25.0000 mg | ORAL_TABLET | Freq: Two times a day (BID) | ORAL | Status: DC

## 2011-04-24 MED ORDER — PHENTERMINE HCL 37.5 MG PO CAPS: ORAL_CAPSULE | ORAL | Status: DC

## 2011-04-24 NOTE — Patient Instructions (Signed)
The patient is instructed to continue all medications as prescribed. Schedule followup with check out clerk upon leaving the clinic  

## 2011-04-24 NOTE — Progress Notes (Signed)
  Subjective:    Patient ID: Robert [name], male    DOB: 1975-12-09, 36 y.o.   MRN: [id number]  HPI She presents with significant weight gain to discuss appetite suppressant therapy as well as ADHD therapy.  At this point he is more interested in therapy for appetite suppression and we discussed to determine its effects side effects and use and obesity   Review of Systems  Constitutional: Negative for fever and fatigue.  HENT: Negative for hearing loss, congestion, neck pain and postnasal drip.   Eyes: Negative for discharge, redness and visual disturbance.  Respiratory: Negative for cough, shortness of breath and wheezing.   Cardiovascular: Negative for leg swelling.  Gastrointestinal: Negative for abdominal pain, constipation and abdominal distention.  Genitourinary: Negative for urgency and frequency.  Musculoskeletal: Negative for joint swelling and arthralgias.  Skin: Negative for color change and rash.  Neurological: Negative for weakness and light-headedness.  Hematological: Negative for adenopathy.  Psychiatric/Behavioral: Negative for behavioral problems.   Past Medical History  Diagnosis Date  . Chronic sinusitis   . ADD (attention deficit disorder) without hyperactivity     according to his mother/ work test possible    History   Social History  . Marital Status: Married    Spouse Name: N/A    Number of Children: N/A  . Years of Education: N/A   Occupational History  . Not on file.   Social History Main Topics  . Smoking status: Never Smoker   . Smokeless tobacco: Not on file  . Alcohol Use: Yes     social  . Drug Use: No  . Sexually Active: Yes   Other Topics Concern  . Not on file   Social History Narrative   Works time [employer] of 1 pets dog and cats.    Past Surgical History  Procedure Date  . Nasal sinus surgery     Family History  Problem Relation Age of Onset  . Hyperlipidemia    . Hypertension Mother     Allergies  Allergen  Reactions  . Penicillins Anaphylaxis  . Topamax Other (See Comments)    Is allergic when taking while drinking alcohol    Current Outpatient Prescriptions on File Prior to Visit  Medication Sig Dispense Refill  . ALPRAZolam (XANAX) 0.25 MG tablet Take 1 tablet (0.25 mg total) by mouth 3 (three) times daily as needed for anxiety.  60 tablet  0    BP 130/80  Pulse 76  Temp 98.2 F (36.8 C)  Resp 16  Ht 6\' 3"  (1.905 m)  Wt 282 lb (127.914 kg)  BMI 35.25 kg/m2        Objective:   Physical Exam  Constitutional: He appears well-developed and well-nourished.  HENT:  Head: Normocephalic and atraumatic.  Eyes: Conjunctivae are normal. Pupils are equal, round, and reactive to light.  Neck: Normal range of motion. Neck supple.  Cardiovascular: Normal rate and regular rhythm.   Pulmonary/Chest: Effort normal and breath sounds normal.  Abdominal: Soft. Bowel sounds are normal.          Assessment & Plan:  Obese young male with great potential for complications from his obesity will begin to determine that he simply 5 mg by mouth daily with a 3 month followup.  Also recommend that he researched the DASH. diet on And to try to follow their recommendations

## 2011-04-25 LAB — GC/CHLAMYDIA PROBE AMP, URINE
Chlamydia, Swab/Urine, PCR: NEGATIVE
GC Probe Amp, Urine: NEGATIVE

## 2011-04-25 LAB — HIV ANTIBODY (ROUTINE TESTING W REFLEX): HIV: NONREACTIVE

## 2011-04-30 ENCOUNTER — Telehealth: Payer: Self-pay | Admitting: *Deleted

## 2011-04-30 NOTE — Telephone Encounter (Signed)
Noted in chart by dr Lovell Sheehan

## 2011-04-30 NOTE — Telephone Encounter (Signed)
Pt had an allergic reaction with his Topamax and alcohol, "facial paralysis".  Wanted Dr. Lovell Sheehan to know.

## 2011-05-27 ENCOUNTER — Other Ambulatory Visit: Payer: Self-pay | Admitting: *Deleted

## 2011-05-27 ENCOUNTER — Telehealth: Payer: Self-pay | Admitting: *Deleted

## 2011-05-27 NOTE — Telephone Encounter (Signed)
Appointment given.

## 2011-05-27 NOTE — Telephone Encounter (Signed)
Pt called stating he had allergic reaction to topamax and would like to try new medication that starts with a q.  Pt needs an ov with dr Lovell Sheehan to discuss- please call pt and give appointment-thanks-

## 2011-06-02 ENCOUNTER — Ambulatory Visit (INDEPENDENT_AMBULATORY_CARE_PROVIDER_SITE_OTHER): Payer: 59 | Admitting: Internal Medicine

## 2011-06-02 ENCOUNTER — Encounter: Payer: Self-pay | Admitting: Internal Medicine

## 2011-06-02 VITALS — BP 124/80 | HR 76 | Temp 98.2°F | Resp 16 | Ht 75.0 in | Wt 286.0 lb

## 2011-06-02 DIAGNOSIS — M539 Dorsopathy, unspecified: Secondary | ICD-10-CM

## 2011-06-02 DIAGNOSIS — M62830 Muscle spasm of back: Secondary | ICD-10-CM

## 2011-06-02 DIAGNOSIS — F909 Attention-deficit hyperactivity disorder, unspecified type: Secondary | ICD-10-CM | POA: Insufficient documentation

## 2011-06-02 MED ORDER — AMPHETAMINE-DEXTROAMPHET ER 20 MG PO CP24: 20.0000 mg | ORAL_CAPSULE | Freq: Two times a day (BID) | ORAL | Status: DC

## 2011-06-02 MED ORDER — CYCLOBENZAPRINE HCL 10 MG PO TABS: 10.0000 mg | ORAL_TABLET | Freq: Three times a day (TID) | ORAL | Status: AC | PRN

## 2011-06-02 NOTE — Patient Instructions (Addendum)
The patient is instructed to continue all medications as prescribed. Schedule followup with check out clerk upon leaving the clinic Giving you a muscle relaxant for about 3 or 4 days to take for your back did not take the Adderall until you finish the muscle accident.  Start with the Adderall once a day in the morning do that for about a week to you adjust to the Adderall then add the afternoon dose

## 2011-06-02 NOTE — Progress Notes (Signed)
Subjective:    Patient ID: Robert [name], male    DOB: 12-Aug-1975, 36 y.o.   MRN: [id number]  HPI  Acute back injury with spasm from lifting. Possible ADD Weight gain Discussion of alcohol quantities     Review of Systems  Constitutional: Negative for fever and fatigue.  HENT: Negative for hearing loss, congestion, neck pain and postnasal drip.   Eyes: Negative for discharge, redness and visual disturbance.  Respiratory: Negative for cough, shortness of breath and wheezing.   Cardiovascular: Negative for leg swelling.  Gastrointestinal: Negative for abdominal pain, constipation and abdominal distention.  Genitourinary: Negative for urgency and frequency.  Musculoskeletal: Negative for joint swelling and arthralgias.  Skin: Negative for color change and rash.  Neurological: Negative for weakness and light-headedness.  Hematological: Negative for adenopathy.  Psychiatric/Behavioral: Negative for behavioral problems.   Past Medical History  Diagnosis Date  . Chronic sinusitis     History   Social History  . Marital Status: Married    Spouse Name: N/A    Number of Children: N/A  . Years of Education: N/A   Occupational History  . Not on file.   Social History Main Topics  . Smoking status: Never Smoker   . Smokeless tobacco: Not on file  . Alcohol Use: Yes     social  . Drug Use: No  . Sexually Active: Yes   Other Topics Concern  . Not on file   Social History Narrative   Works time [employer] of 1 pets dog and cats.    Past Surgical History  Procedure Date  . Nasal sinus surgery     Family History  Problem Relation Age of Onset  . Hyperlipidemia    . Hypertension Mother     Allergies  Allergen Reactions  . Penicillins Anaphylaxis  . Topamax Other (See Comments)    Is allergic when taking while drinking alcohol    Current Outpatient Prescriptions on File Prior to Visit  Medication Sig Dispense Refill  . ALPRAZolam (XANAX) 0.25 MG tablet  Take 1 tablet (0.25 mg total) by mouth 3 (three) times daily as needed for anxiety.  60 tablet  0  . phentermine 37.5 MG capsule 1/2 tab every day  30 capsule  1    BP 124/80  Pulse 76  Temp 98.2 F (36.8 C)  Resp 16  Ht 6\' 3"  (1.905 m)  Wt 286 lb (129.729 kg)  BMI 35.75 kg/m2       Objective:   Physical Exam  Nursing note and vitals reviewed. Constitutional: He appears well-developed and well-nourished.  HENT:  Head: Normocephalic and atraumatic.  Eyes: Conjunctivae are normal. Pupils are equal, round, and reactive to light.  Neck: Normal range of motion. Neck supple.  Cardiovascular: Normal rate and regular rhythm.   Pulmonary/Chest: Effort normal and breath sounds normal.  Abdominal: Soft. Bowel sounds are normal.          Assessment & Plan:  Acute back spasm we'll give him Flexeril 10 mg 3 times a day for short course to not begin the Adderall until after you've completed the Flexeril.  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  We will start Adderall 20 mg by mouth in a.m. and it was muscle mass and size we will plan to titrate to 20 mg twice a day.  He'll follow  back in one month he was given careful instructions as to what symptoms to look for the he will be intolerant to the medication and should stop the medication immediately and call our office

## 2011-06-16 ENCOUNTER — Ambulatory Visit (INDEPENDENT_AMBULATORY_CARE_PROVIDER_SITE_OTHER): Payer: 59 | Admitting: Family

## 2011-06-16 DIAGNOSIS — Z0289 Encounter for other administrative examinations: Secondary | ICD-10-CM

## 2011-06-16 DIAGNOSIS — Z0000 Encounter for general adult medical examination without abnormal findings: Secondary | ICD-10-CM

## 2011-06-16 NOTE — Progress Notes (Deleted)
  Subjective:    Patient ID: Robert [name], male    DOB: 02-May-1975, 36 y.o.   MRN: [id number]  HPI    Review of Systems     Objective:   Physical Exam        Assessment & Plan:

## 2011-07-06 ENCOUNTER — Ambulatory Visit (INDEPENDENT_AMBULATORY_CARE_PROVIDER_SITE_OTHER): Payer: 59 | Admitting: Internal Medicine

## 2011-07-06 ENCOUNTER — Encounter: Payer: Self-pay | Admitting: Internal Medicine

## 2011-07-06 VITALS — BP 140/80 | HR 76 | Temp 98.2°F | Resp 16 | Ht 75.0 in | Wt 284.0 lb

## 2011-07-06 DIAGNOSIS — F909 Attention-deficit hyperactivity disorder, unspecified type: Secondary | ICD-10-CM

## 2011-07-06 MED ORDER — AMPHETAMINE-DEXTROAMPHET ER 20 MG PO CP24: 20.0000 mg | ORAL_CAPSULE | Freq: Two times a day (BID) | ORAL | Status: DC

## 2011-07-06 NOTE — Patient Instructions (Signed)
Weight watchers on line

## 2011-07-06 NOTE — Progress Notes (Signed)
  Subjective:    Patient ID: Robert [name], male    DOB: 09-Jun-1975, 36 y.o.   MRN: [id number]  HPI  Weight loss protocol using ADD medications Increased energy and focus Weight still an issue   Review of Systems  Constitutional: Negative for fever and fatigue.  HENT: Negative for hearing loss, congestion, neck pain and postnasal drip.   Eyes: Negative for discharge, redness and visual disturbance.  Respiratory: Negative for cough, shortness of breath and wheezing.   Cardiovascular: Negative for leg swelling.  Gastrointestinal: Negative for abdominal pain, constipation and abdominal distention.  Genitourinary: Negative for urgency and frequency.  Musculoskeletal: Negative for joint swelling and arthralgias.  Skin: Negative for color change and rash.  Neurological: Negative for weakness and light-headedness.  Hematological: Negative for adenopathy.  Psychiatric/Behavioral: Negative for behavioral problems.       Past Medical History  Diagnosis Date  . Chronic sinusitis   . ADD (attention deficit disorder) without hyperactivity     according to his mother/ work test possible    History   Social History  . Marital Status: Married    Spouse Name: N/A    Number of Children: N/A  . Years of Education: N/A   Occupational History  . Not on file.   Social History Main Topics  . Smoking status: Never Smoker   . Smokeless tobacco: Not on file  . Alcohol Use: Yes     social  . Drug Use: No  . Sexually Active: Yes   Other Topics Concern  . Not on file   Social History Narrative   Works time [employer] of 1 pets dog and cats.    Past Surgical History  Procedure Date  . Nasal sinus surgery     Family History  Problem Relation Age of Onset  . Hyperlipidemia    . Hypertension Mother     Allergies  Allergen Reactions  . Penicillins Anaphylaxis  . Topamax Other (See Comments)    Is allergic when taking while drinking alcohol    Current Outpatient  Prescriptions on File Prior to Visit  Medication Sig Dispense Refill  . ALPRAZolam (XANAX) 0.25 MG tablet Take 1 tablet (0.25 mg total) by mouth 3 (three) times daily as needed for anxiety.  60 tablet  0  . DISCONTD: amphetamine-dextroamphetamine (ADDERALL XR) 20 MG 24 hr capsule Take 1 capsule (20 mg total) by mouth 2 (two) times daily with breakfast and lunch.  60 capsule  0    BP 140/80  Pulse 76  Temp 98.2 F (36.8 C)  Resp 16  Ht 6\' 3"  (1.905 m)  Wt 284 lb (128.822 kg)  BMI 35.50 kg/m2    Objective:   Physical Exam  Nursing note and vitals reviewed. Constitutional: He appears well-developed and well-nourished.       obese  HENT:  Head: Normocephalic and atraumatic.  Eyes: Conjunctivae are normal. Pupils are equal, round, and reactive to light.  Neck: Normal range of motion. Neck supple.  Cardiovascular: Normal rate and regular rhythm.   Pulmonary/Chest: Effort normal and breath sounds normal.  Abdominal: Soft. Bowel sounds are normal.          Assessment & Plan:  Weight loss is an isue ADD improved

## 2011-08-07 ENCOUNTER — Telehealth: Payer: Self-pay

## 2011-08-07 MED ORDER — BUPROPION HCL ER (XL) 300 MG PO TB24: 300.0000 mg | ORAL_TABLET | Freq: Every day | ORAL | Status: DC

## 2011-08-07 NOTE — Telephone Encounter (Signed)
Per dr Lovell Sheehan- stop adderall and may start wellbutrin 300 qd

## 2011-08-07 NOTE — Telephone Encounter (Signed)
Triage call - would like a call back from bonnie - on new rx of adderall and having side effects Please call at [phone]

## 2011-08-07 NOTE — Telephone Encounter (Signed)
Pt states he is unable to have orgasm while on adderall. An y suggestions?

## 2011-08-10 ENCOUNTER — Telehealth: Payer: Self-pay | Admitting: Family Medicine

## 2011-08-10 NOTE — Telephone Encounter (Signed)
Please try it for a few more days atleast 2 weeks

## 2011-08-10 NOTE — Telephone Encounter (Signed)
Pt call about 1:00 - pulled from Triage vmail. Has been taking the Welbutrin for a few days. Experiencing a lot of fatigue, feeling lethargic, and has no energy. Please call to advise.

## 2011-10-07 ENCOUNTER — Ambulatory Visit (INDEPENDENT_AMBULATORY_CARE_PROVIDER_SITE_OTHER): Payer: 59 | Admitting: Internal Medicine

## 2011-10-07 ENCOUNTER — Encounter: Payer: Self-pay | Admitting: Internal Medicine

## 2011-10-07 VITALS — BP 140/80 | HR 72 | Temp 98.4°F | Resp 16 | Ht 75.0 in | Wt 290.0 lb

## 2011-10-07 DIAGNOSIS — E669 Obesity, unspecified: Secondary | ICD-10-CM

## 2011-10-07 DIAGNOSIS — T887XXA Unspecified adverse effect of drug or medicament, initial encounter: Secondary | ICD-10-CM

## 2011-10-07 DIAGNOSIS — F988 Other specified behavioral and emotional disorders with onset usually occurring in childhood and adolescence: Secondary | ICD-10-CM

## 2011-10-07 MED ORDER — AMPHETAMINE-DEXTROAMPHET ER 30 MG PO CP24: 30.0000 mg | ORAL_CAPSULE | Freq: Every day | ORAL | Status: DC

## 2011-10-07 NOTE — Progress Notes (Signed)
Subjective:    Patient ID: Robert [name], male    DOB: Nov 06, 1975, 36 y.o.   MRN: [id number]  HPI Reviewed the side effect of the medications ED samples given Discussed diet and exercise including a low carbohydrates or low gluten diet    Review of Systems  Constitutional: Negative for fever and fatigue.  HENT: Negative for hearing loss, congestion, neck pain and postnasal drip.   Eyes: Negative for discharge, redness and visual disturbance.  Respiratory: Negative for cough, shortness of breath and wheezing.   Cardiovascular: Negative for leg swelling.  Gastrointestinal: Negative for abdominal pain, constipation and abdominal distention.  Genitourinary: Negative for urgency and frequency.  Musculoskeletal: Negative for joint swelling and arthralgias.  Skin: Negative for color change and rash.  Neurological: Negative for weakness and light-headedness.  Hematological: Negative for adenopathy.  Psychiatric/Behavioral: Negative for behavioral problems.    The patient is instructed to continue all medications as prescribed. Schedule followup with check out clerk upon leaving the clinic     Past Medical History  Diagnosis Date  . Chronic sinusitis   . ADD (attention deficit disorder) without hyperactivity     according to his mother/ work test possible    History   Social History  . Marital Status: Married    Spouse Name: N/A    Number of Children: N/A  . Years of Education: N/A   Occupational History  . Not on file.   Social History Main Topics  . Smoking status: Never Smoker   . Smokeless tobacco: Not on file  . Alcohol Use: Yes     social  . Drug Use: No  . Sexually Active: Yes   Other Topics Concern  . Not on file   Social History Narrative   Works time [employer] of 1 pets dog and cats.    Past Surgical History  Procedure Date  . Nasal sinus surgery     Family History  Problem Relation Age of Onset  . Hyperlipidemia    . Hypertension Mother      Allergies  Allergen Reactions  . Penicillins Anaphylaxis  . Topamax Other (See Comments)    Is allergic when taking while drinking alcohol    Current Outpatient Prescriptions on File Prior to Visit  Medication Sig Dispense Refill  . ALPRAZolam (XANAX) 0.25 MG tablet Take 1 tablet (0.25 mg total) by mouth 3 (three) times daily as needed for anxiety.  60 tablet  0  . buPROPion (WELLBUTRIN XL) 300 MG 24 hr tablet Take 1 tablet (300 mg total) by mouth daily.  30 tablet  3    BP 140/80  Pulse 72  Temp 98.4 F (36.9 C)  Resp 16  Ht 6\' 3"  (1.905 m)  Wt 290 lb (131.543 kg)  BMI 36.25 kg/m2    Objective:   Physical Exam  Nursing note and vitals reviewed. Constitutional: He appears well-developed and well-nourished.  HENT:  Head: Normocephalic and atraumatic.  Eyes: Conjunctivae are normal. Pupils are equal, round, and reactive to light.  Neck: Normal range of motion. Neck supple.  Cardiovascular: Normal rate and regular rhythm.   Pulmonary/Chest: Effort normal and breath sounds normal.  Abdominal: Soft. Bowel sounds are normal.          Assessment & Plan:  monitoring ADD Had side effect of ED from the PM adderral  discussed medication change Reviewed principles a low carbohydrate low gluten diet  I have spent more than 30 minutes examining this patient face-to-face of which over  half was spent in counseling

## 2011-10-07 NOTE — Patient Instructions (Addendum)
The patient is instructed to continue all medications as prescribed. Schedule followup with check out clerk upon leaving the clinic  

## 2012-01-01 NOTE — Progress Notes (Signed)
°  Subjective:    Patient ID: Robert [name], male    DOB: 1976-02-15, 36 y.o.   MRN: [id number]  HPI    Review of Systems     Objective:   Physical Exam        Assessment & Plan:  Pt no showed

## 2012-01-07 ENCOUNTER — Ambulatory Visit: Payer: 59 | Admitting: Internal Medicine

## 2012-01-11 ENCOUNTER — Telehealth: Payer: Self-pay | Admitting: Internal Medicine

## 2012-01-11 DIAGNOSIS — F988 Other specified behavioral and emotional disorders with onset usually occurring in childhood and adolescence: Secondary | ICD-10-CM

## 2012-01-11 MED ORDER — AMPHETAMINE-DEXTROAMPHET ER 30 MG PO CP24: 30.0000 mg | ORAL_CAPSULE | Freq: Every day | ORAL | Status: DC

## 2012-01-11 NOTE — Telephone Encounter (Signed)
Printed and will call pt when ready for pick up 

## 2012-01-11 NOTE — Telephone Encounter (Signed)
Patient called stating that he need a refill of his adderall. Please assist.  °

## 2012-01-19 ENCOUNTER — Telehealth: Payer: Self-pay | Admitting: Internal Medicine

## 2012-01-19 NOTE — Telephone Encounter (Signed)
[facility] Triage Call Report Triage Record Num: [id number] Operator: Estevan Oaks Patient Name: Robert [name] Call Date & Time: 01/15/2012 7:47:56PM Patient Phone: [phone] PCP: Darryll Capers Patient Gender: Male PCP Fax : [phone] Patient DOB: [birth year] [facility] Name: [facility] Reason for Call: Caller: Robert [name]; PCP: Darryll Capers (Adults only); CB#: [phone]; Robert [name] is calling from [facility] to verify Rx for Adderall. Rn able to verify Rx written on 10/14 by Dr Lovell Sheehan Protocol(s) Used: Office Note Recommended Outcome per Protocol: Information Noted and Sent to Office Reason for Outcome: Caller information to office

## 2012-02-23 ENCOUNTER — Ambulatory Visit: Payer: 59 | Admitting: Internal Medicine

## 2012-03-03 ENCOUNTER — Encounter: Payer: Self-pay | Admitting: Family Medicine

## 2012-03-03 ENCOUNTER — Telehealth: Payer: Self-pay | Admitting: Internal Medicine

## 2012-03-03 ENCOUNTER — Ambulatory Visit (INDEPENDENT_AMBULATORY_CARE_PROVIDER_SITE_OTHER): Payer: 59 | Admitting: Family Medicine

## 2012-03-03 VITALS — BP 100/62 | Temp 97.9°F | Wt 288.0 lb

## 2012-03-03 DIAGNOSIS — J069 Acute upper respiratory infection, unspecified: Secondary | ICD-10-CM

## 2012-03-03 MED ORDER — AZITHROMYCIN 250 MG PO TABS: ORAL_TABLET | ORAL | Status: AC

## 2012-03-03 NOTE — Progress Notes (Signed)
  Subjective:    Patient ID: Robert [name], male    DOB: [birth year], 36 y.o.   MRN: [id number]  HPI  Acute visit. Onset 4 days ago of bilateral maxillary sinus pain. He had sore throat and productive cough past couple days. No fever. Has history of septoplasty 2 years ago. He had no sinus infections since then. He had some mild malaise. No fever or chills. Generally does not have any seasonal or perennial allergies. Allergy to penicillin. Cough is relatively mild. No significant headaches. No purulent nasal secretions  Review of Systems  Constitutional: Negative for fever and chills.  HENT: Positive for congestion and sore throat.   Respiratory: Positive for cough. Negative for shortness of breath and wheezing.   Neurological: Negative for headaches.       Objective:   Physical Exam  Constitutional: He appears well-developed and well-nourished.  HENT:  Right Ear: External ear normal.  Left Ear: External ear normal.  Mouth/Throat: Oropharynx is clear and moist.  Neck: Neck supple.  Cardiovascular: Normal rate and regular rhythm.   Pulmonary/Chest: Effort normal and breath sounds normal. No respiratory distress. He has no wheezes. He has no rales.  Lymphadenopathy:    He has no cervical adenopathy.          Assessment & Plan:  Acute upper respiratory infection. Suspect viral. Try Mucinex. Consider Netti pot and saline irrigation. Not recommend antibiotic at this time unless he has any progressive symptoms.

## 2012-03-03 NOTE — Patient Instructions (Addendum)
Consider Netti pot and saline irrigation Drink lots of fluids Consider short term use of mucinex or sudafed.

## 2012-03-03 NOTE — Telephone Encounter (Signed)
Patient Information:  Caller Name: Nicholos Johns  Phone: [phone]  Patient: Robert [name], Robert [name]  Gender: Male  DOB: [birth year]  Age: 36 Years  PCP: Darryll Capers (Adults only)   Symptoms  Reason For Call & Symptoms: Started cold with nasal drainage that has turned thick yellow; Cough non-productive. Is tired and achy  Reviewed Health History In EMR: Yes  Reviewed Medications In EMR: Yes  Reviewed Allergies In EMR: Yes  Reviewed Surgeries / Procedures: Yes  Date of Onset of Symptoms: 02/29/2012  Guideline(s) Used:  Cold Sores - Fever Blisters of Lip  Colds  Disposition Per Guideline:   See Today or Tomorrow in Office  Reason For Disposition Reached:   Patient wants to be seen  Advice Given:  Call Back If:  You become worse  Office Follow Up:  Does the office need to follow up with this patient?: No  Instructions For The Office: N/A  Appointment Scheduled:  03/03/2012 15:30:00 Appointment Scheduled Provider:  Evelena Peat (Family Practice)  RN Note:  Appointment scheduled for 3:30 with Dr. Caryl Never.  Dr. Lovell Sheehan and Ms. Orvan Falconer were unavailable.

## 2012-03-11 ENCOUNTER — Ambulatory Visit (INDEPENDENT_AMBULATORY_CARE_PROVIDER_SITE_OTHER): Payer: 59 | Admitting: Internal Medicine

## 2012-03-11 ENCOUNTER — Other Ambulatory Visit: Payer: Self-pay | Admitting: *Deleted

## 2012-03-11 ENCOUNTER — Encounter: Payer: Self-pay | Admitting: Internal Medicine

## 2012-03-11 VITALS — BP 130/80 | HR 76 | Temp 97.9°F | Resp 16 | Ht 75.0 in | Wt 284.0 lb

## 2012-03-11 DIAGNOSIS — J329 Chronic sinusitis, unspecified: Secondary | ICD-10-CM

## 2012-03-11 DIAGNOSIS — F988 Other specified behavioral and emotional disorders with onset usually occurring in childhood and adolescence: Secondary | ICD-10-CM

## 2012-03-11 MED ORDER — LEVOFLOXACIN 500 MG PO TABS
500.0000 mg | ORAL_TABLET | Freq: Every day | ORAL | Status: DC
Start: 2012-03-11 — End: 2012-05-19

## 2012-03-11 MED ORDER — AMPHETAMINE-DEXTROAMPHET ER 30 MG PO CP24: 30.0000 mg | ORAL_CAPSULE | Freq: Every day | ORAL | Status: DC

## 2012-03-11 NOTE — Progress Notes (Signed)
Subjective:    Patient ID: Robert [name], male    DOB: [birth year], 36 y.o.   MRN: [id number]  HPI  Patient presents for routine followup for ADD medications and refill per protocol as well as an acute complaint of more likely to be chronic sinusitis.  The patient has been ill for several weeks he took a Z-Pak there was some improvement his secretions but now on day 5 of a Z-Pak his symptoms however vertically    Review of Systems  Constitutional: Negative for fever and fatigue.  HENT: Positive for congestion, rhinorrhea, postnasal drip and sinus pressure. Negative for hearing loss and neck pain.   Eyes: Negative for discharge, redness and visual disturbance.  Respiratory: Negative for cough, shortness of breath and wheezing.   Cardiovascular: Negative for leg swelling.  Gastrointestinal: Negative for abdominal pain, constipation and abdominal distention.  Genitourinary: Negative for urgency and frequency.  Musculoskeletal: Negative for joint swelling and arthralgias.  Skin: Negative for color change and rash.  Neurological: Negative for weakness and light-headedness.  Hematological: Negative for adenopathy.  Psychiatric/Behavioral: Negative for behavioral problems.   Past Medical History  Diagnosis Date  . Chronic sinusitis   . ADD (attention deficit disorder) without hyperactivity     according to his mother/ work test possible    History   Social History  . Marital Status: Married    Spouse Name: N/A    Number of Children: N/A  . Years of Education: N/A   Occupational History  . Not on file.   Social History Main Topics  . Smoking status: Never Smoker   . Smokeless tobacco: Not on file  . Alcohol Use: Yes     Comment: social  . Drug Use: No  . Sexually Active: Yes   Other Topics Concern  . Not on file   Social History Narrative   Works time [employer] of 1 pets dog and cats.    Past Surgical History  Procedure Date  . Nasal sinus surgery     Family  History  Problem Relation Age of Onset  . Hyperlipidemia    . Hypertension Mother     Allergies  Allergen Reactions  . Penicillins Anaphylaxis  . Topamax Other (See Comments)    Is allergic when taking while drinking alcohol    Current Outpatient Prescriptions on File Prior to Visit  Medication Sig Dispense Refill  . ALPRAZolam (XANAX) 0.25 MG tablet Take 1 tablet (0.25 mg total) by mouth 3 (three) times daily as needed for anxiety.  60 tablet  0    BP 130/80  Pulse 76  Temp 97.9 F (36.6 C)  Resp 16  Ht 6\' 3"  (1.905 m)  Wt 284 lb (128.822 kg)  BMI 35.50 kg/m2       Objective:   Physical Exam  Nursing note and vitals reviewed. Constitutional: He appears well-developed and well-nourished.  HENT:  Head: Normocephalic and atraumatic.  Eyes: Conjunctivae normal are normal. Pupils are equal, round, and reactive to light.  Neck: Normal range of motion. Neck supple.  Cardiovascular: Normal rate and regular rhythm.   Pulmonary/Chest: Effort normal and breath sounds normal.  Abdominal: Soft. Bowel sounds are normal.          Assessment & Plan:  ADD refilled per protocol extend antibiotic coverage for more likely to be chronic maxillary sinusitis Levaquin 500 mg by mouth daily for 14 days  I have spent more than 30 minutes examining this patient face-to-face of which over half was  spent in counseling

## 2012-05-19 ENCOUNTER — Ambulatory Visit (INDEPENDENT_AMBULATORY_CARE_PROVIDER_SITE_OTHER): Payer: BC Managed Care – PPO | Admitting: Family Medicine

## 2012-05-19 VITALS — BP 140/84 | HR 76 | Temp 98.2°F | Resp 16 | Ht 75.0 in | Wt 286.0 lb

## 2012-05-19 DIAGNOSIS — J209 Acute bronchitis, unspecified: Secondary | ICD-10-CM

## 2012-05-19 NOTE — Patient Instructions (Signed)
-  continue qvar for another 1-2 weeks until cough is gone  -follow up if cough persists for more then 3 weeks or worsening symptoms

## 2012-05-19 NOTE — Progress Notes (Signed)
Chief Complaint  Patient presents with  . Cough    to urgent care first of feb with llittle results from meds-  . Nasal Congestion  . afebrile    HPI:  -started: 3 weeks ago -symptoms:nasal congestion, sore throat, cough -denies:fever, SOB, NVD, tooth pain, strep or mono exposure - feeling much better but cough has lingered a little, improving -has tried: zpack, levaquin, steroid inj, prednisone -denies: SOB, fevers, NVD -sick contacts: friend at work with similar symptoms -Hx of: no asthma    ROS: See pertinent positives and negatives per HPI.  Past Medical History  Diagnosis Date  . Chronic sinusitis   . ADD (attention deficit disorder) without hyperactivity     according to his mother/ work test possible    Family History  Problem Relation Age of Onset  . Hyperlipidemia    . Hypertension Mother     History   Social History  . Marital Status: Married    Spouse Name: N/A    Number of Children: N/A  . Years of Education: N/A   Social History Main Topics  . Smoking status: Never Smoker   . Smokeless tobacco: Not on file  . Alcohol Use: Yes     Comment: social  . Drug Use: No  . Sexually Active: Yes   Other Topics Concern  . Not on file   Social History Narrative   Works time [employer] of 1 pets dog and cats.    Current outpatient prescriptions:ALBUTEROL IN, Inhale 1 puff into the lungs 2 (two) times daily as needed., Disp: , Rfl: ;  amphetamine-dextroamphetamine (ADDERALL XR) 30 MG 24 hr capsule, Take 1 capsule (30 mg total) by mouth daily., Disp: 90 capsule, Rfl: 0;  beclomethasone (QVAR) 40 MCG/ACT inhaler, Inhale 2 puffs into the lungs 2 (two) times daily., Disp: , Rfl:   EXAM:  Filed Vitals:   05/19/12 1355  BP: 140/84  Pulse: 76  Temp: 98.2 F (36.8 C)  Resp: 16    Body mass index is 35.75 kg/(m^2).  GENERAL: vitals reviewed and listed above, alert, oriented, appears well hydrated and in no acute distress  HEENT: atraumatic,  conjunttiva clear, no obvious abnormalities on inspection of external nose and ears, normal appearance of ear canals and TMs, clear nasal congestion, mild post oropharyngeal erythema with PND, no tonsillar edema or exudate, no sinus TTP  NECK: no obvious masses on inspection  LUNGS: clear to auscultation bilaterally, no wheezes, rales or rhonchi, good air movement  CV: HRRR, no peripheral edema  MS: moves all extremities without noticeable abnormality  PSYCH: pleasant and cooperative, no obvious depression or anxiety  ASSESSMENT AND PLAN:  Discussed the following assessment and plan:  Acute bronchitis  -doing much better, lungs clear, no SOB - advised per below and return precautions -Patient advised to return or notify a doctor immediately if symptoms worsen or persist or new concerns arise.  Patient Instructions  -continue qvar for another 1-2 weeks until cough is gone  -follow up if cough persists for more then 3 weeks or worsening symptoms     Catherina Pates R.

## 2012-05-20 ENCOUNTER — Other Ambulatory Visit: Payer: Self-pay | Admitting: Internal Medicine

## 2012-06-10 ENCOUNTER — Ambulatory Visit (INDEPENDENT_AMBULATORY_CARE_PROVIDER_SITE_OTHER): Payer: BC Managed Care – PPO | Admitting: Internal Medicine

## 2012-06-10 ENCOUNTER — Encounter: Payer: Self-pay | Admitting: Internal Medicine

## 2012-06-10 VITALS — BP 130/80 | HR 74 | Temp 97.3°F | Wt 285.0 lb

## 2012-06-10 DIAGNOSIS — R058 Other specified cough: Secondary | ICD-10-CM

## 2012-06-10 DIAGNOSIS — R059 Cough, unspecified: Secondary | ICD-10-CM

## 2012-06-10 DIAGNOSIS — F988 Other specified behavioral and emotional disorders with onset usually occurring in childhood and adolescence: Secondary | ICD-10-CM

## 2012-06-10 DIAGNOSIS — R05 Cough: Secondary | ICD-10-CM

## 2012-06-10 MED ORDER — AMPHETAMINE-DEXTROAMPHET ER 30 MG PO CP24: 30.0000 mg | ORAL_CAPSULE | Freq: Every day | ORAL | Status: DC

## 2012-06-10 NOTE — Progress Notes (Signed)
  Subjective:    Patient ID: Robert [name], male    DOB: August 28, 1975, 37 y.o.   MRN: [id number]  HPI  Weight management and ADD follow up for ,medications Has a recent URI with pnemonia   Review of Systems  Constitutional: Negative for fever and fatigue.  HENT: Negative for hearing loss, congestion, neck pain and postnasal drip.   Eyes: Negative for discharge, redness and visual disturbance.  Respiratory: Negative for cough, shortness of breath and wheezing.   Cardiovascular: Negative for leg swelling.  Gastrointestinal: Negative for abdominal pain, constipation and abdominal distention.  Genitourinary: Negative for urgency and frequency.  Musculoskeletal: Negative for joint swelling and arthralgias.  Skin: Negative for color change and rash.  Neurological: Negative for weakness and light-headedness.  Hematological: Negative for adenopathy.  Psychiatric/Behavioral: Negative for behavioral problems.   Past Medical History  Diagnosis Date  . Chronic sinusitis   . ADD (attention deficit disorder) without hyperactivity     according to his mother/ work test possible    History   Social History  . Marital Status: Married    Spouse Name: N/A    Number of Children: N/A  . Years of Education: N/A   Occupational History  . Not on file.   Social History Main Topics  . Smoking status: Never Smoker   . Smokeless tobacco: Not on file  . Alcohol Use: Yes     Comment: social  . Drug Use: No  . Sexually Active: Yes   Other Topics Concern  . Not on file   Social History Narrative   Works time [employer] of 1 pets dog and cats.    Past Surgical History  Procedure Laterality Date  . Nasal sinus surgery      Family History  Problem Relation Age of Onset  . Hyperlipidemia    . Hypertension Mother     Allergies  Allergen Reactions  . Penicillins Anaphylaxis  . Topamax Other (See Comments)    Is allergic when taking while drinking alcohol    Current Outpatient  Prescriptions on File Prior to Visit  Medication Sig Dispense Refill  . ALBUTEROL IN Inhale 1 puff into the lungs 2 (two) times daily as needed.      . ALPRAZolam (XANAX) 0.25 MG tablet TAKE 1 TABLET BY MOUTH THREE TIMES DAILY AS NEEDED FOR ANXIETY  60 tablet  0  . amphetamine-dextroamphetamine (ADDERALL XR) 30 MG 24 hr capsule Take 1 capsule (30 mg total) by mouth daily.  90 capsule  0  . beclomethasone (QVAR) 40 MCG/ACT inhaler Inhale 2 puffs into the lungs 2 (two) times daily.       No current facility-administered medications on file prior to visit.    BP 130/80  Pulse 74  Temp(Src) 97.3 F (36.3 C) (Oral)  Wt 285 lb (129.275 kg)  BMI 35.62 kg/m2        Objective:   Physical Exam  Nursing note and vitals reviewed. Constitutional: He appears well-developed and well-nourished.  HENT:  Head: Normocephalic and atraumatic.  Eyes: Conjunctivae are normal. Pupils are equal, round, and reactive to light.  Neck: Normal range of motion. Neck supple.  Cardiovascular: Normal rate and regular rhythm.   Pulmonary/Chest: Effort normal and breath sounds normal.  Abdominal: Soft. Bowel sounds are normal.          Assessment & Plan:  ADD Refill of medications Probable flu in February with post viral cogh mucinex DM

## 2012-06-10 NOTE — Patient Instructions (Signed)
The patient is instructed to continue all medications as prescribed. Schedule followup with check out clerk upon leaving the clinic  

## 2012-06-20 ENCOUNTER — Telehealth: Payer: Self-pay | Admitting: Internal Medicine

## 2012-06-20 NOTE — Telephone Encounter (Signed)
Pt received letter from [facility] stating he needed prior approval on his amphetamine-dextroamphetamine (ADDERALL XR) 30 MG 24 hr capsule  Case ID no  [id number]   [facility] no [id number] Pt received letter March 15, but it says it expires march 7. Letter dated 05/09/12.

## 2012-06-21 NOTE — Telephone Encounter (Signed)
Please see below.

## 2012-06-27 NOTE — Telephone Encounter (Signed)
Prior auth request submitted to [facility]. Encounter closed.

## 2012-07-04 ENCOUNTER — Ambulatory Visit (INDEPENDENT_AMBULATORY_CARE_PROVIDER_SITE_OTHER): Payer: BC Managed Care – PPO | Admitting: Family Medicine

## 2012-07-04 ENCOUNTER — Encounter: Payer: Self-pay | Admitting: Family Medicine

## 2012-07-04 VITALS — BP 110/78 | HR 89 | Temp 98.7°F | Wt 290.0 lb

## 2012-07-04 DIAGNOSIS — E669 Obesity, unspecified: Secondary | ICD-10-CM

## 2012-07-04 DIAGNOSIS — R5381 Other malaise: Secondary | ICD-10-CM

## 2012-07-04 LAB — CBC WITH DIFFERENTIAL/PLATELET
Basophils Absolute: 0 10*3/uL (ref 0.0–0.1)
Basophils Relative: 0.6 % (ref 0.0–3.0)
Eosinophils Absolute: 0 10*3/uL (ref 0.0–0.7)
Eosinophils Relative: 0.5 % (ref 0.0–5.0)
HCT: 45.2 % (ref 39.0–52.0)
Hemoglobin: 15.5 g/dL (ref 13.0–17.0)
Lymphocytes Relative: 43 % (ref 12.0–46.0)
Lymphs Abs: 2.6 10*3/uL (ref 0.7–4.0)
MCHC: 34.2 g/dL (ref 30.0–36.0)
MCV: 86.9 fl (ref 78.0–100.0)
Monocytes Absolute: 0.7 10*3/uL (ref 0.1–1.0)
Monocytes Relative: 11 % (ref 3.0–12.0)
Neutro Abs: 2.8 10*3/uL (ref 1.4–7.7)
Neutrophils Relative %: 44.9 % (ref 43.0–77.0)
Platelets: 271 10*3/uL (ref 150.0–400.0)
RBC: 5.2 Mil/uL (ref 4.22–5.81)
RDW: 13.6 % (ref 11.5–14.6)
WBC: 6.1 10*3/uL (ref 4.5–10.5)

## 2012-07-04 LAB — BASIC METABOLIC PANEL
BUN: 14 mg/dL (ref 6–23)
CO2: 26 mEq/L (ref 19–32)
Calcium: 9.4 mg/dL (ref 8.4–10.5)
Chloride: 104 mEq/L (ref 96–112)
Creatinine, Ser: 1.1 mg/dL (ref 0.4–1.5)
GFR: 84.79 mL/min (ref >60.00)
Glucose, Bld: 95 mg/dL (ref 70–99)
Potassium: 4.5 mEq/L (ref 3.5–5.1)
Sodium: 139 mEq/L (ref 135–145)

## 2012-07-04 LAB — LIPID PANEL
Cholesterol: 219 mg/dL — ABNORMAL HIGH (ref 0–200)
HDL: 32.8 mg/dL — ABNORMAL LOW (ref >39.00)
Total CHOL/HDL Ratio: 7
Triglycerides: 169 mg/dL — ABNORMAL HIGH (ref 0.0–149.0)
VLDL: 33.8 mg/dL (ref 0.0–40.0)

## 2012-07-04 LAB — LDL CHOLESTEROL, DIRECT: Direct LDL: 158.1 mg/dL

## 2012-07-04 LAB — VITAMIN B12: Vitamin B-12: >1500 pg/mL — ABNORMAL HIGH (ref 211–911)

## 2012-07-04 LAB — HEMOGLOBIN A1C: Hgb A1c MFr Bld: 5.3 % (ref 4.6–6.5)

## 2012-07-04 LAB — TSH: TSH: 2.48 u[IU]/mL (ref 0.35–5.50)

## 2012-07-04 NOTE — Progress Notes (Signed)
Chief Complaint  Patient presents with  . low energy    HPI:  37 yo M pt of Dr. Lovell Sheehan here for acute visit for decreased energy: -saw PCP recently, seems had flu recently per notes - frequent visits over last few months for sinus issues -feels like energy has been low for a long time - for several years -take sublingual B12 and this doesn't seem to help -mood is ok but stress level is high at work -no regular exercise -feels like sleeps well, but doesn't feel well rested, does have OSA and has CPAP which he uses -no fevers, unintentional weight loss, palpitations, SOB, changes in bowels, urinary symptoms, GI bleeding, sexual function is good -wants tetanus booster - thinks has been over ten years -has not had physical in a long time,   ROS: See pertinent positives and negatives per HPI.  Past Medical History  Diagnosis Date  . Chronic sinusitis   . ADD (attention deficit disorder) without hyperactivity     according to his mother/ work test possible    Family History  Problem Relation Age of Onset  . Hyperlipidemia    . Hypertension Mother     History   Social History  . Marital Status: Married    Spouse Name: N/A    Number of Children: N/A  . Years of Education: N/A   Social History Main Topics  . Smoking status: Never Smoker   . Smokeless tobacco: None  . Alcohol Use: Yes     Comment: social  . Drug Use: No  . Sexually Active: Yes   Other Topics Concern  . None   Social History Narrative   Works time [employer] of 1 pets dog and cats.    Current outpatient prescriptions:ALBUTEROL IN, Inhale 1 puff into the lungs 2 (two) times daily as needed., Disp: , Rfl: ;  ALPRAZolam (XANAX) 0.25 MG tablet, TAKE 1 TABLET BY MOUTH THREE TIMES DAILY AS NEEDED FOR ANXIETY, Disp: 60 tablet, Rfl: 0;  amphetamine-dextroamphetamine (ADDERALL XR) 30 MG 24 hr capsule, Take 1 capsule (30 mg total) by mouth daily., Disp: 90 capsule, Rfl: 0 beclomethasone (QVAR) 40 MCG/ACT  inhaler, Inhale 2 puffs into the lungs 2 (two) times daily., Disp: , Rfl:   EXAM:  Filed Vitals:   07/04/12 1021  BP: 110/78  Pulse: 89  Temp: 98.7 F (37.1 C)    Body mass index is 36.25 kg/(m^2).  GENERAL: vitals reviewed and listed above, alert, oriented, appears well hydrated and in no acute distress  HEENT: atraumatic, conjunttiva clear, no obvious abnormalities on inspection of external nose and ears  NECK: no obvious masses on inspection  LUNGS: clear to auscultation bilaterally, no wheezes, rales or rhonchi, good air movement  CV: HRRR, no peripheral edema  MS: moves all extremities without noticeable abnormality  PSYCH: pleasant and cooperative, no obvious depression or anxiety  ASSESSMENT AND PLAN:  Discussed the following assessment and plan:  Other malaise and fatigue - Plan: Lipid Panel, Hemoglobin A1c, TSH, CBC with Differential, Basic metabolic panel, Vitamin B12, Vitamin D, 25-hydroxy  Obesity  -sedentary lifestyle and diet not great with chronic fatigue for many years with no other complaints or indications of serious illness -advised healthy lifestyle, low carb, nutrient rich diet, regular exercise and stress management -will do basic labs per order per his request as has not had in a while, follow up with PCP if ongoing -> 25 minutes spent in lifestyle counseling - discussing exercise and diet  options, supplements, etc - warned supplements not regulated and may not be safe -Patient advised to return or notify a doctor immediately if symptoms worsen or persist or new concerns arise. -tdap today per his request  Patient Instructions  -We have ordered labs or studies at this visit. It can take up to 1-2 weeks for results and processing. We will contact you with instructions IF your results are abnormal. Normal results will be released to your [facility]. If you have not heard from [place] or can not find your results in [facility] in 2 weeks please contact our  office.  We recommend the following healthy lifestyle measures: - eat a healthy diet consisting of lots of vegetables, fruits, beans, nuts, seeds, healthy meats such as white chicken and fish and whole grains.  - avoid fried foods, fast food, processed foods, sodas, red meet and other fattening foods.  - get a least 150 minutes of aerobic exercise per week.            Kriste Basque R.

## 2012-07-04 NOTE — Patient Instructions (Addendum)
-  We have ordered labs or studies at this visit. It can take up to 1-2 weeks for results and processing. We will contact you with instructions IF your results are abnormal. Normal results will be released to your MYCHART. If you have not heard from us or can not find your results in MYCHART in 2 weeks please contact our office.  We recommend the following healthy lifestyle measures: - eat a healthy diet consisting of lots of vegetables, fruits, beans, nuts, seeds, healthy meats such as white chicken and fish and whole grains.  - avoid fried foods, fast food, processed foods, sodas, red meet and other fattening foods.  - get a least 150 minutes of aerobic exercise per week.        

## 2012-07-05 ENCOUNTER — Ambulatory Visit (INDEPENDENT_AMBULATORY_CARE_PROVIDER_SITE_OTHER): Payer: BC Managed Care – PPO | Admitting: Family Medicine

## 2012-07-05 ENCOUNTER — Telehealth: Payer: Self-pay | Admitting: Family Medicine

## 2012-07-05 VITALS — BP 138/84 | HR 88 | Temp 98.7°F | Wt 290.0 lb

## 2012-07-05 DIAGNOSIS — E785 Hyperlipidemia, unspecified: Secondary | ICD-10-CM

## 2012-07-05 DIAGNOSIS — T7840XA Allergy, unspecified, initial encounter: Secondary | ICD-10-CM

## 2012-07-05 DIAGNOSIS — E559 Vitamin D deficiency, unspecified: Secondary | ICD-10-CM

## 2012-07-05 LAB — VITAMIN D 25 HYDROXY (VIT D DEFICIENCY, FRACTURES): Vit D, 25-Hydroxy: 26 ng/mL — ABNORMAL LOW (ref 30–89)

## 2012-07-05 NOTE — Telephone Encounter (Signed)
Summary of labs: Blood counts, thyroid and kidneys look good b12 is very high! Do not need to keep taking it.  Vitamin D is low, would advise 2000 IU of Vitamin D3 daily Cholesterol is high, advise daily CV exercise and a healthy diet and follow up with PCP as scheduled - discuss medications for this with PCP

## 2012-07-05 NOTE — Progress Notes (Signed)
Chief Complaint  Patient presents with  . drank mold    nausea, stomach pains;     HPI:  Mold Ingestion: -drank some tea that had some mold on it -stomach hurt a little after drinking it -no resp symptoms  Also wants to review labs.  ROS: See pertinent positives and negatives per HPI.  Past Medical History  Diagnosis Date  . Chronic sinusitis   . ADD (attention deficit disorder) without hyperactivity     according to his mother/ work test possible    Family History  Problem Relation Age of Onset  . Hyperlipidemia    . Hypertension Mother     History   Social History  . Marital Status: Married    Spouse Name: N/A    Number of Children: N/A  . Years of Education: N/A   Social History Main Topics  . Smoking status: Never Smoker   . Smokeless tobacco: Not on file  . Alcohol Use: Yes     Comment: social  . Drug Use: No  . Sexually Active: Yes   Other Topics Concern  . Not on file   Social History Narrative   Works time [employer] of 1 pets dog and cats.    Current outpatient prescriptions:ALBUTEROL IN, Inhale 1 puff into the lungs 2 (two) times daily as needed., Disp: , Rfl: ;  ALPRAZolam (XANAX) 0.25 MG tablet, TAKE 1 TABLET BY MOUTH THREE TIMES DAILY AS NEEDED FOR ANXIETY, Disp: 60 tablet, Rfl: 0;  amphetamine-dextroamphetamine (ADDERALL XR) 30 MG 24 hr capsule, Take 1 capsule (30 mg total) by mouth daily., Disp: 90 capsule, Rfl: 0 beclomethasone (QVAR) 40 MCG/ACT inhaler, Inhale 2 puffs into the lungs 2 (two) times daily., Disp: , Rfl:   EXAM:  Filed Vitals:   07/05/12 1259  BP: 138/84  Pulse: 88  Temp: 98.7 F (37.1 C)    Body mass index is 36.25 kg/(m^2).  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  There are no Patient Instructions on file for this visit.   Kriste Basque R.

## 2012-07-05 NOTE — Telephone Encounter (Signed)
Called and spoke with pt and pt is aware.  Pt also states that he accidentally got his [employer] cups mixed up and drank one that was from last week and it was full of mold.  Pt states he has an allergy to mold and penicillin.  Pt states he has pressure in his stomach and nausea.

## 2012-07-08 ENCOUNTER — Telehealth: Payer: Self-pay | Admitting: Internal Medicine

## 2012-07-08 DIAGNOSIS — F988 Other specified behavioral and emotional disorders with onset usually occurring in childhood and adolescence: Secondary | ICD-10-CM

## 2012-07-08 MED ORDER — AMPHETAMINE-DEXTROAMPHET ER 30 MG PO CP24: 30.0000 mg | ORAL_CAPSULE | Freq: Every day | ORAL | Status: DC

## 2012-07-08 NOTE — Telephone Encounter (Signed)
Patient called stating that his mail order med is delayed due to his pharmacy and he will need a 7 day rx to last until the mail order rx can be delivered. Please assist.

## 2012-07-08 NOTE — Telephone Encounter (Signed)
7 days of adderall printed for pt-- states insurance co said they would pay for the 7 and the 90 sent earlier.

## 2012-09-05 ENCOUNTER — Telehealth: Payer: Self-pay | Admitting: Internal Medicine

## 2012-09-05 NOTE — Telephone Encounter (Signed)
PT called and stated that he needs to make an aggressive change to his weight loss program. Please assist.

## 2012-09-05 NOTE — Telephone Encounter (Signed)
Pt informed we will try to completed fmla so he can be out for 5 weeks for extensive weight loss program

## 2012-10-06 ENCOUNTER — Telehealth: Payer: Self-pay | Admitting: Internal Medicine

## 2012-10-06 NOTE — Telephone Encounter (Signed)
Pt called and stated that on the FMLA paperwork Dr. Lovell Sheehan filled out fo rhim, one box was marked incorrectly. The FMLA people at his work will not accept the forms with changes - they gave Robert [name] a whole new set of paperwork to be completed anew.  Pt needs this by mon or Tues. He will bring it by today. FYI

## 2012-10-06 NOTE — Telephone Encounter (Signed)
Pt needs 3 new rx for his Adderall. Please call when ready for pick up. Thanks.

## 2012-10-07 ENCOUNTER — Other Ambulatory Visit: Payer: Self-pay | Admitting: *Deleted

## 2012-10-07 DIAGNOSIS — Z0279 Encounter for issue of other medical certificate: Secondary | ICD-10-CM

## 2012-10-07 DIAGNOSIS — F988 Other specified behavioral and emotional disorders with onset usually occurring in childhood and adolescence: Secondary | ICD-10-CM

## 2012-10-07 MED ORDER — AMPHETAMINE-DEXTROAMPHET ER 30 MG PO CP24: 30.0000 mg | ORAL_CAPSULE | Freq: Every day | ORAL | Status: DC

## 2012-10-07 NOTE — Telephone Encounter (Signed)
done

## 2012-10-07 NOTE — Telephone Encounter (Signed)
Pt informed this will be ready for Monday to pick up

## 2012-10-12 ENCOUNTER — Other Ambulatory Visit: Payer: Self-pay | Admitting: *Deleted

## 2012-10-12 DIAGNOSIS — F988 Other specified behavioral and emotional disorders with onset usually occurring in childhood and adolescence: Secondary | ICD-10-CM

## 2012-10-12 MED ORDER — AMPHETAMINE-DEXTROAMPHET ER 30 MG PO CP24: 30.0000 mg | ORAL_CAPSULE | Freq: Every day | ORAL | Status: DC

## 2012-11-08 ENCOUNTER — Telehealth: Payer: Self-pay | Admitting: Internal Medicine

## 2012-11-08 MED ORDER — CYCLOBENZAPRINE HCL 10 MG PO TABS: 10.0000 mg | ORAL_TABLET | Freq: Three times a day (TID) | ORAL | Status: DC | PRN

## 2012-11-08 NOTE — Telephone Encounter (Signed)
Will refill flexeril (currently ordered) and will make ov with padonda for tomorrow

## 2012-11-08 NOTE — Telephone Encounter (Signed)
Pt states hurt his back last Thursday and it is not better. Would like a refill of cyclobenzaprine (FLEXERIL) 10 MG. Also states he would appreciate a pain med also. Pharm: [facility]

## 2012-11-09 ENCOUNTER — Encounter: Payer: Self-pay | Admitting: Family

## 2012-11-09 ENCOUNTER — Ambulatory Visit (INDEPENDENT_AMBULATORY_CARE_PROVIDER_SITE_OTHER): Payer: BC Managed Care – PPO | Admitting: Family

## 2012-11-09 VITALS — BP 110/74 | HR 80 | Wt 278.0 lb

## 2012-11-09 DIAGNOSIS — M545 Low back pain, unspecified: Secondary | ICD-10-CM

## 2012-11-09 DIAGNOSIS — IMO0002 Reserved for concepts with insufficient information to code with codable children: Secondary | ICD-10-CM

## 2012-11-09 DIAGNOSIS — M5416 Radiculopathy, lumbar region: Secondary | ICD-10-CM

## 2012-11-09 MED ORDER — METHYLPREDNISOLONE 4 MG PO KIT: PACK | ORAL | Status: DC

## 2012-11-09 MED ORDER — HYDROCODONE-ACETAMINOPHEN 10-325 MG PO TABS: 1.0000 | ORAL_TABLET | Freq: Three times a day (TID) | ORAL | Status: DC | PRN

## 2012-11-09 NOTE — Patient Instructions (Addendum)
Back Exercises Back exercises help treat and prevent back injuries. The goal of back exercises is to increase the strength of your abdominal and back muscles and the flexibility of your back. These exercises should be started when you no longer have back pain. Back exercises include:  Pelvic Tilt. Lie on your back with your knees bent. Tilt your pelvis until the lower part of your back is against the floor. Hold this position 5 to 10 sec and repeat 5 to 10 times.  Knee to Chest. Pull first 1 knee up against your chest and hold for 20 to 30 seconds, repeat this with the other knee, and then both knees. This may be done with the other leg straight or bent, whichever feels better.  Sit-Ups or Curl-Ups. Bend your knees 90 degrees. Start with tilting your pelvis, and do a partial, slow sit-up, lifting your trunk only 30 to 45 degrees off the floor. Take at least 2 to 3 seconds for each sit-up. Do not do sit-ups with your knees out straight. If partial sit-ups are difficult, simply do the above but with only tightening your abdominal muscles and holding it as directed.  Hip-Lift. Lie on your back with your knees flexed 90 degrees. Push down with your feet and shoulders as you raise your hips a couple inches off the floor; hold for 10 seconds, repeat 5 to 10 times.  Back arches. Lie on your stomach, propping yourself up on bent elbows. Slowly press on your hands, causing an arch in your low back. Repeat 3 to 5 times. Any initial stiffness and discomfort should lessen with repetition over time.  Shoulder-Lifts. Lie face down with arms beside your body. Keep hips and torso pressed to floor as you slowly lift your head and shoulders off the floor. Do not overdo your exercises, especially in the beginning. Exercises may cause you some mild back discomfort which lasts for a few minutes; however, if the pain is more severe, or lasts for more than 15 minutes, do not continue exercises until you see your caregiver.  Improvement with exercise therapy for back problems is slow.  See your caregivers for assistance with developing a proper back exercise program. Document Released: 04/23/2004 Document Revised: 06/08/2011 Document Reviewed: 01/15/2011 [employer] Patient Information 2014 [employer].  

## 2012-11-09 NOTE — Progress Notes (Signed)
  Subjective:    Patient ID: Robert [name], male    DOB: 01/27/1976, 37 y.o.   MRN: [id number]  HPI 37 year old white male, nonsmoker, patient of Dr. Lovell Sheehan in today with complaints of low back pain x6 days and unchanged. He rates the pain a 9/10, they typically waxes and wanes in intensity. He describes it as a constant dull ache but the sharp pain with movement. It's worse with bending he was better with use of the hot tub. He's been applying ice and heat. Also taken anti-inflammatories without relief. Patient had a similar episode approximately one year ago. No acute injury   Review of Systems  Constitutional: Negative.   Respiratory: Negative.   Cardiovascular: Negative.   Gastrointestinal: Negative.   Genitourinary: Negative.   Musculoskeletal: Positive for back pain.  Neurological: Negative.   Psychiatric/Behavioral: Negative.        Objective:   Physical Exam  Constitutional: He is oriented to person, place, and time. He appears well-developed and well-nourished.  Neck: Normal range of motion. Neck supple.  Cardiovascular: Normal rate and regular rhythm.   Pulmonary/Chest: Effort normal and breath sounds normal.  Musculoskeletal: He exhibits tenderness.  30 hip flexion before pain is elicited. No pain directly to palpation of the spine. Positive straight leg raise to the right lower extremity. No swelling  Neurological: He is alert and oriented to person, place, and time. He has normal reflexes.  Skin: Skin is warm and dry.  Psychiatric: He has a normal mood and affect.          Assessment & Plan:  Assessment: 1. Low back pain 2. Lumbar radiculopathy  Plan: Medrol Dosepak as directed. Norco as needed for pain. Patient needs to be affected area. Patient the office if symptoms worsen or persist. Recheck as scheduled, and as needed.

## 2012-12-12 ENCOUNTER — Ambulatory Visit (INDEPENDENT_AMBULATORY_CARE_PROVIDER_SITE_OTHER): Payer: BC Managed Care – PPO | Admitting: Internal Medicine

## 2012-12-12 ENCOUNTER — Encounter: Payer: Self-pay | Admitting: Internal Medicine

## 2012-12-12 ENCOUNTER — Ambulatory Visit: Payer: BC Managed Care – PPO | Admitting: Internal Medicine

## 2012-12-12 VITALS — BP 140/80 | HR 76 | Temp 97.9°F | Resp 16 | Ht 75.0 in | Wt 278.0 lb

## 2012-12-12 DIAGNOSIS — Z23 Encounter for immunization: Secondary | ICD-10-CM

## 2012-12-12 DIAGNOSIS — F988 Other specified behavioral and emotional disorders with onset usually occurring in childhood and adolescence: Secondary | ICD-10-CM

## 2012-12-12 MED ORDER — LISDEXAMFETAMINE DIMESYLATE 60 MG PO CAPS: 60.0000 mg | ORAL_CAPSULE | ORAL | Status: DC

## 2012-12-12 NOTE — Progress Notes (Signed)
  Subjective:    Patient ID: Robert [name], male    DOB: 1975/11/03, 37 y.o.   MRN: [id number]  HPI  Weight redistribution Has been exercising adderal dosing discussed due to drops in energy Has noted when missed dosing  Review of Systems  Constitutional: Negative for fever and fatigue.  HENT: Negative for hearing loss, congestion, neck pain and postnasal drip.   Eyes: Negative for discharge, redness and visual disturbance.  Respiratory: Negative for cough, shortness of breath and wheezing.   Cardiovascular: Negative for leg swelling.  Gastrointestinal: Negative for abdominal pain, constipation and abdominal distention.  Genitourinary: Negative for urgency and frequency.  Musculoskeletal: Negative for joint swelling and arthralgias.  Skin: Negative for color change and rash.  Neurological: Negative for weakness and light-headedness.  Hematological: Negative for adenopathy.  Psychiatric/Behavioral: Negative for behavioral problems.       Objective:   Physical Exam  Constitutional: He appears well-developed and well-nourished.  HENT:  Head: Normocephalic and atraumatic.  Eyes: Conjunctivae are normal. Pupils are equal, round, and reactive to light.  Neck: Normal range of motion. Neck supple.  Cardiovascular: Normal rate and regular rhythm.   Pulmonary/Chest: Effort normal and breath sounds normal.  Abdominal: Soft. Bowel sounds are normal.          Assessment & Plan:  The patent has cPAP  And is using the machine every night True ADD  Poor control Testing today Change to 60 mg long acting

## 2013-02-02 ENCOUNTER — Other Ambulatory Visit: Payer: Self-pay

## 2013-04-03 ENCOUNTER — Encounter: Payer: Self-pay | Admitting: Internal Medicine

## 2013-04-03 ENCOUNTER — Ambulatory Visit (INDEPENDENT_AMBULATORY_CARE_PROVIDER_SITE_OTHER): Payer: BC Managed Care – PPO | Admitting: Internal Medicine

## 2013-04-03 DIAGNOSIS — F988 Other specified behavioral and emotional disorders with onset usually occurring in childhood and adolescence: Secondary | ICD-10-CM

## 2013-04-03 MED ORDER — LORCASERIN HCL 10 MG PO TABS: 1.0000 | ORAL_TABLET | Freq: Two times a day (BID) | ORAL | Status: DC

## 2013-04-03 MED ORDER — LISDEXAMFETAMINE DIMESYLATE 60 MG PO CAPS: 60.0000 mg | ORAL_CAPSULE | ORAL | Status: DC

## 2013-04-03 NOTE — Progress Notes (Signed)
Subjective:    Patient ID: Robert [name], male    DOB: January 26, 1976, 38 y.o.   MRN: [id number]  HPI weight control and management Has started a HCG diet but this failed ADD stable on Vyvnace 60       Review of Systems  Constitutional: Negative for fever and fatigue.  HENT: Negative for congestion, hearing loss and postnasal drip.   Eyes: Negative for discharge, redness and visual disturbance.  Respiratory: Negative for cough, shortness of breath and wheezing.   Cardiovascular: Negative for leg swelling.  Gastrointestinal: Negative for abdominal pain, constipation and abdominal distention.  Genitourinary: Negative for urgency and frequency.  Musculoskeletal: Negative for arthralgias, joint swelling and neck pain.  Skin: Negative for color change and rash.  Neurological: Negative for weakness and light-headedness.  Hematological: Negative for adenopathy.  Psychiatric/Behavioral: Negative for behavioral problems.   Past Medical History  Diagnosis Date  . Chronic sinusitis   . ADD (attention deficit disorder) without hyperactivity     according to his mother/ work test possible    History   Social History  . Marital Status: Married    Spouse Name: N/A    Number of Children: N/A  . Years of Education: N/A   Occupational History  . Not on file.   Social History Main Topics  . Smoking status: Never Smoker   . Smokeless tobacco: Not on file  . Alcohol Use: Yes     Comment: social  . Drug Use: No  . Sexual Activity: Yes   Other Topics Concern  . Not on file   Social History Narrative   Works time [employer] of 1 pets dog and cats.    Past Surgical History  Procedure Laterality Date  . Nasal sinus surgery      Family History  Problem Relation Age of Onset  . Hyperlipidemia    . Hypertension Mother     Allergies  Allergen Reactions  . Penicillins Anaphylaxis  . Topamax Other (See Comments)    Is allergic when taking while drinking alcohol     Current Outpatient Prescriptions on File Prior to Visit  Medication Sig Dispense Refill  . ALPRAZolam (XANAX) 0.25 MG tablet TAKE 1 TABLET BY MOUTH THREE TIMES DAILY AS NEEDED FOR ANXIETY  60 tablet  0  . cyclobenzaprine (FLEXERIL) 10 MG tablet Take 1 tablet (10 mg total) by mouth 3 (three) times daily as needed for muscle spasms.  30 tablet  0  . HYDROcodone-acetaminophen (NORCO) 10-325 MG per tablet Take 1 tablet by mouth every 8 (eight) hours as needed for pain.  30 tablet  0  . lisdexamfetamine (VYVANSE) 60 MG capsule Take 1 capsule (60 mg total) by mouth every morning.  90 capsule  0   No current facility-administered medications on file prior to visit.    BP 124/80  Pulse 76  Temp(Src) 98.2 F (36.8 C)  Resp 16  Ht 6\' 3"  (1.905 m)  Wt 284 lb (128.822 kg)  BMI 35.50 kg/m2        Objective:   Physical Exam  Nursing note and vitals reviewed. Constitutional: He appears well-developed and well-nourished.  HENT:  Head: Normocephalic and atraumatic.  Eyes: Conjunctivae are normal. Pupils are equal, round, and reactive to light.  Neck: Normal range of motion. Neck supple.  Cardiovascular: Normal rate and regular rhythm.   Pulmonary/Chest: Effort normal and breath sounds normal.  Abdominal: Soft. Bowel sounds are normal.  Assessment & Plan:  Morbid  In the greater that 35 BMI discussion of Belviq trial for appetitive control

## 2013-04-03 NOTE — Progress Notes (Signed)
Pre visit review using our clinic review tool, if applicable. No additional management support is needed unless otherwise documented below in the visit note. 

## 2013-04-22 ENCOUNTER — Other Ambulatory Visit: Payer: Self-pay | Admitting: Family

## 2013-08-29 ENCOUNTER — Telehealth: Payer: Self-pay | Admitting: Internal Medicine

## 2013-08-29 DIAGNOSIS — F988 Other specified behavioral and emotional disorders with onset usually occurring in childhood and adolescence: Secondary | ICD-10-CM

## 2013-08-29 NOTE — Telephone Encounter (Signed)
Could you sign for Dr Arnoldo Morale since he out of the office?

## 2013-08-29 NOTE — Telephone Encounter (Signed)
Yes but needs an OV prior to the printed prescription ending

## 2013-08-29 NOTE — Telephone Encounter (Signed)
Pt req a rx lisdexamfetamine (VYVANSE) 60 MG capsule   Pt is requesting  90 supply instead 3 30 days

## 2013-09-04 MED ORDER — LISDEXAMFETAMINE DIMESYLATE 60 MG PO CAPS: 60.0000 mg | ORAL_CAPSULE | ORAL | Status: DC

## 2013-09-04 NOTE — Telephone Encounter (Signed)
Ok per Dr Arnoldo Morale, rx up front for p/u, pt aware

## 2013-09-04 NOTE — Telephone Encounter (Signed)
lmovm for pt to c/b and schedule an appt

## 2013-09-11 ENCOUNTER — Ambulatory Visit (INDEPENDENT_AMBULATORY_CARE_PROVIDER_SITE_OTHER): Payer: BC Managed Care – PPO | Admitting: Family

## 2013-09-11 ENCOUNTER — Encounter: Payer: Self-pay | Admitting: Family

## 2013-09-11 VITALS — BP 110/70 | HR 81 | Temp 98.4°F | Wt 300.0 lb

## 2013-09-11 DIAGNOSIS — F411 Generalized anxiety disorder: Secondary | ICD-10-CM

## 2013-09-11 DIAGNOSIS — F988 Other specified behavioral and emotional disorders with onset usually occurring in childhood and adolescence: Secondary | ICD-10-CM

## 2013-09-11 MED ORDER — ALPRAZOLAM 0.25 MG PO TABS: ORAL_TABLET | ORAL | Status: DC

## 2013-09-11 MED ORDER — LORCASERIN HCL 10 MG PO TABS: 1.0000 | ORAL_TABLET | Freq: Two times a day (BID) | ORAL | Status: DC

## 2013-09-11 MED ORDER — LISDEXAMFETAMINE DIMESYLATE 60 MG PO CAPS: 60.0000 mg | ORAL_CAPSULE | ORAL | Status: DC

## 2013-09-11 NOTE — Progress Notes (Signed)
   Subjective:    Patient ID: Robert [name], male    DOB: 12/28/1975, 38 y.o.   MRN: [id number]  HPI 38 year old white male, nonsmoker, in today for recheck of attention deficit disorder, anxiety, and obesity. Is not currently exercising. Plan resume exercise. Is tolerating medications well.    Review of Systems  Constitutional: Negative.   HENT: Negative.   Respiratory: Negative.   Cardiovascular: Negative.   Gastrointestinal: Negative.   Endocrine: Negative.   Genitourinary: Negative.   Skin: Negative.   Psychiatric/Behavioral: Negative.    Past Medical History  Diagnosis Date  . Chronic sinusitis   . ADD (attention deficit disorder) without hyperactivity     according to his mother/ work test possible    History   Social History  . Marital Status: Married    Spouse Name: N/A    Number of Children: N/A  . Years of Education: N/A   Occupational History  . Not on file.   Social History Main Topics  . Smoking status: Never Smoker   . Smokeless tobacco: Not on file  . Alcohol Use: Yes     Comment: social  . Drug Use: No  . Sexual Activity: Yes   Other Topics Concern  . Not on file   Social History Narrative   Works time [employer] of 1 pets dog and cats.    Past Surgical History  Procedure Laterality Date  . Nasal sinus surgery      Family History  Problem Relation Age of Onset  . Hyperlipidemia    . Hypertension Mother     Allergies  Allergen Reactions  . Penicillins Anaphylaxis  . Topamax Other (See Comments)    Is allergic when taking while drinking alcohol    No current outpatient prescriptions on file prior to visit.   No current facility-administered medications on file prior to visit.    BP 110/70  Pulse 81  Temp(Src) 98.4 F (36.9 C) (Oral)  Wt 300 lb (136.079 kg)chart    Objective:   Physical Exam  Constitutional: He is oriented to person, place, and time. He appears well-developed and well-nourished.  Neck: Normal  range of motion. Neck supple.  Cardiovascular: Normal rate, regular rhythm and normal heart sounds.   Pulmonary/Chest: Effort normal and breath sounds normal.  Musculoskeletal: Normal range of motion.  Neurological: He is alert and oriented to person, place, and time.  Skin: Skin is warm and dry.  Psychiatric: He has a normal mood and affect.          Assessment & Plan:   Problem List Items Addressed This Visit   None    Visit Diagnoses   ADD (attention deficit disorder)    -  Primary    Relevant Medications       lisdexamfetamine (VYVANSE) capsule       Lorcaserin HCl (BELVIQ) 10 MG TABS    Anxiety state, unspecified        Relevant Medications       ALPRAZolam  (XANAX) tablet    Obesity, morbid        Relevant Medications       lisdexamfetamine (VYVANSE) capsule       Lorcaserin HCl (BELVIQ) 10 MG TABS     Call the office with any questions or concerns. Recheck in 6 months and sooner as needed.

## 2013-09-11 NOTE — Patient Instructions (Signed)
Exercise to Stay Healthy Exercise helps you become and stay healthy. EXERCISE IDEAS AND TIPS Choose exercises that:  You enjoy.  Fit into your day. You do not need to exercise really hard to be healthy. You can do exercises at a slow or medium level and stay healthy. You can:  Stretch before and after working out.  Try yoga, Pilates, or tai chi.  Lift weights.  Walk fast, swim, jog, run, climb stairs, bicycle, dance, or rollerskate.  Take aerobic classes. Exercises that burn about 150 calories:  Running 1  miles in 15 minutes.  Playing volleyball for 45 to 60 minutes.  Washing and waxing a car for 45 to 60 minutes.  Playing touch football for 45 minutes.  Walking 1  miles in 35 minutes.  Pushing a stroller 1  miles in 30 minutes.  Playing basketball for 30 minutes.  Raking leaves for 30 minutes.  Bicycling 5 miles in 30 minutes.  Walking 2 miles in 30 minutes.  Dancing for 30 minutes.  Shoveling snow for 15 minutes.  Swimming laps for 20 minutes.  Walking up stairs for 15 minutes.  Bicycling 4 miles in 15 minutes.  Gardening for 30 to 45 minutes.  Jumping rope for 15 minutes.  Washing windows or floors for 45 to 60 minutes. Document Released: 04/18/2010 Document Revised: 06/08/2011 Document Reviewed: 04/18/2010 [employer] Patient Information 2014 [employer].  

## 2013-09-11 NOTE — Progress Notes (Signed)
Pre visit review using our clinic review tool, if applicable. No additional management support is needed unless otherwise documented below in the visit note. 

## 2013-09-19 ENCOUNTER — Telehealth: Payer: Self-pay | Admitting: Internal Medicine

## 2013-09-19 NOTE — Telephone Encounter (Signed)
[employer] denied the PA for Belviq.  I was advised the denial is due to the pt not achieving 5% or greater BMI loss.  I will send a copy of the denial letter back for review once it is received.

## 2013-09-19 NOTE — Telephone Encounter (Signed)
Pt called about prior auth for Lorcaserin HCl (BELVIQ) 10 MG TABS

## 2013-09-19 NOTE — Telephone Encounter (Signed)
PA was denied.  I sent a note to [facility]

## 2013-09-20 NOTE — Telephone Encounter (Signed)
I am sending the denial letter back for review before it is sent to scanning.

## 2013-09-20 NOTE — Telephone Encounter (Signed)
FYI

## 2013-10-11 ENCOUNTER — Ambulatory Visit: Payer: BC Managed Care – PPO | Admitting: Internal Medicine

## 2013-10-25 ENCOUNTER — Ambulatory Visit (INDEPENDENT_AMBULATORY_CARE_PROVIDER_SITE_OTHER): Payer: BC Managed Care – PPO | Admitting: Family

## 2013-10-25 ENCOUNTER — Encounter: Payer: Self-pay | Admitting: Family

## 2013-10-25 VITALS — BP 120/84 | HR 85 | Temp 98.9°F | Ht 75.0 in | Wt 298.0 lb

## 2013-10-25 DIAGNOSIS — L209 Atopic dermatitis, unspecified: Secondary | ICD-10-CM

## 2013-10-25 DIAGNOSIS — L2089 Other atopic dermatitis: Secondary | ICD-10-CM

## 2013-10-25 MED ORDER — TRIAMCINOLONE 0.1 % CREAM:EUCERIN CREAM 1:1
1.0000 | TOPICAL_CREAM | Freq: Two times a day (BID) | CUTANEOUS | Status: DC
Start: 2013-10-25 — End: 2014-09-27

## 2013-10-25 NOTE — Progress Notes (Signed)
Subjective:    Patient ID: Robert [name], male    DOB: 18-Feb-1976, 38 y.o.   MRN: [id number]  HPI 38 year old white male, nonsmoker, obese his complaints or rash to his right lower leg times several months. The rash is worse when he sweats. Describes it is dry and flaky. Applying mometasone cream that helps. However, the rash returns. No history of atopic dermatitis in the past. He is also taking Belviq for obesity and tolerating the medication well. According to her records, he is down 2 pounds but reports being down 10 pounds.   Review of Systems  Constitutional: Negative.   Respiratory: Negative.   Cardiovascular: Negative.   Genitourinary: Negative.   Musculoskeletal: Negative.   Allergic/Immunologic: Negative.   Neurological: Negative.   Psychiatric/Behavioral: Negative.    Past Medical History  Diagnosis Date  . Chronic sinusitis   . ADD (attention deficit disorder) without hyperactivity     according to his mother/ work test possible    History   Social History  . Marital Status: Married    Spouse Name: N/A    Number of Children: N/A  . Years of Education: N/A   Occupational History  . Not on file.   Social History Main Topics  . Smoking status: Never Smoker   . Smokeless tobacco: Not on file  . Alcohol Use: Yes     Comment: social  . Drug Use: No  . Sexual Activity: Yes   Other Topics Concern  . Not on file   Social History Narrative   Works time [employer] of 1 pets dog and cats.    Past Surgical History  Procedure Laterality Date  . Nasal sinus surgery      Family History  Problem Relation Age of Onset  . Hyperlipidemia    . Hypertension Mother     Allergies  Allergen Reactions  . Penicillins Anaphylaxis  . Topamax Other (See Comments)    Is allergic when taking while drinking alcohol    Current Outpatient Prescriptions on File Prior to Visit  Medication Sig Dispense Refill  . ALPRAZolam (XANAX) 0.25 MG tablet TAKE 1 TABLET BY  MOUTH THREE TIMES DAILY AS NEEDED FOR ANXIETY  60 tablet  0  . lisdexamfetamine (VYVANSE) 60 MG capsule Take 1 capsule (60 mg total) by mouth every morning.  90 capsule  0  . Lorcaserin HCl (BELVIQ) 10 MG TABS Take 1 tablet by mouth 2 (two) times daily.  60 tablet  4   No current facility-administered medications on file prior to visit.    BP 120/84  Pulse 85  Temp(Src) 98.9 F (37.2 C) (Oral)  Ht 6\' 3"  (1.905 m)  Wt 298 lb (135.172 kg)  BMI 37.25 kg/m2chart    Objective:   Physical Exam  Constitutional: He is oriented to person, place, and time. He appears well-developed and well-nourished.  Neck: Normal range of motion. Neck supple.  Cardiovascular: Normal rate, regular rhythm and normal heart sounds.   Pulmonary/Chest: Effort normal and breath sounds normal.  Musculoskeletal: Normal range of motion.  Neurological: He is alert and oriented to person, place, and time.  Skin: Rash noted.  Dry, flaky, urticarial rash to the right lower leg.   Psychiatric: He has a normal mood and affect.          Assessment & Plan:  Laden was seen today for rash.  Diagnoses and associated orders for this visit:  Atopic dermatitis  Morbid obesity  Other Orders -  Triamcinolone Acetonide (TRIAMCINOLONE 0.1 % CREAM : EUCERIN) CREA; Apply 1 application topically 2 (two) times daily.   Call the office with any questions or concerns. Recheck in 1 month and sooner as needed.

## 2013-10-25 NOTE — Patient Instructions (Signed)

## 2013-10-25 NOTE — Progress Notes (Signed)
Pre visit review using our clinic review tool, if applicable. No additional management support is needed unless otherwise documented below in the visit note. 

## 2013-11-02 ENCOUNTER — Encounter: Payer: Self-pay | Admitting: Family

## 2013-11-03 ENCOUNTER — Telehealth: Payer: Self-pay | Admitting: Family

## 2013-11-03 NOTE — Telephone Encounter (Signed)
Pt will possibly need a f/u appt. Will discuss with Padonda on Monday

## 2013-11-03 NOTE — Telephone Encounter (Signed)
Pt would like one rx vyvanse 60 mg #90

## 2013-11-06 NOTE — Telephone Encounter (Signed)
Will call pt back after forms have been completed. He would like for them to say temporary unable to wear pants until around 01/11/2014.   Will call [facility] pharmacy to ensure the pt only received 30 tablets of Vyvanse.   Pt did only receive 30 tabs of Vyvanse

## 2013-11-07 ENCOUNTER — Other Ambulatory Visit: Payer: Self-pay

## 2013-11-07 DIAGNOSIS — F988 Other specified behavioral and emotional disorders with onset usually occurring in childhood and adolescence: Secondary | ICD-10-CM

## 2013-11-07 MED ORDER — LISDEXAMFETAMINE DIMESYLATE 60 MG PO CAPS: 60.0000 mg | ORAL_CAPSULE | ORAL | Status: DC

## 2013-11-28 ENCOUNTER — Telehealth: Payer: Self-pay | Admitting: Family

## 2013-11-28 ENCOUNTER — Other Ambulatory Visit: Payer: Self-pay | Admitting: Family

## 2013-11-28 ENCOUNTER — Other Ambulatory Visit (INDEPENDENT_AMBULATORY_CARE_PROVIDER_SITE_OTHER): Payer: BC Managed Care – PPO

## 2013-11-28 DIAGNOSIS — E079 Disorder of thyroid, unspecified: Secondary | ICD-10-CM

## 2013-11-28 LAB — T4, FREE: Free T4: 0.95 ng/dL (ref 0.60–1.60)

## 2013-11-28 LAB — TSH: TSH: 0.75 u[IU]/mL (ref 0.35–4.50)

## 2013-11-28 LAB — T3, FREE: T3, Free: 3.1 pg/mL (ref 2.3–4.2)

## 2013-11-28 NOTE — Telephone Encounter (Signed)
Call from Dr. Burnice Logan,  reports patient had a CT scan done that showed a well defined mass on the thyroid, suggest ultrasound. Ultrasound has been ordered and patient notified.

## 2013-11-28 NOTE — Addendum Note (Signed)
Addended by: Joyce Gross R on: 11/28/2013 02:07 PM   Modules accepted: Orders

## 2013-11-29 ENCOUNTER — Encounter: Payer: Self-pay | Admitting: Family

## 2013-12-05 ENCOUNTER — Ambulatory Visit
Admission: RE | Admit: 2013-12-05 | Discharge: 2013-12-05 | Disposition: A | Discharge status: Home / Self Care | Payer: BC Managed Care – PPO | Source: Ambulatory Visit | Attending: Family | Admitting: Family

## 2013-12-05 ENCOUNTER — Other Ambulatory Visit (HOSPITAL_COMMUNITY)
Admission: RE | Admit: 2013-12-05 | Discharge: 2013-12-05 | Disposition: A | Discharge status: Home / Self Care | Payer: BC Managed Care – PPO | Source: Ambulatory Visit | Attending: Interventional Radiology | Admitting: Interventional Radiology

## 2013-12-05 DIAGNOSIS — E041 Nontoxic single thyroid nodule: Secondary | ICD-10-CM | POA: Insufficient documentation

## 2013-12-05 DIAGNOSIS — E079 Disorder of thyroid, unspecified: Secondary | ICD-10-CM

## 2013-12-07 ENCOUNTER — Telehealth: Payer: Self-pay | Admitting: Family

## 2013-12-07 NOTE — Telephone Encounter (Signed)
Robert [name] from [facility] states that lisdexamfetamine (VYVANSE) 60 MG capsule can only be filled for 30 days at a time.  They are going to fill the RX for a 30 day supply

## 2013-12-08 NOTE — Telephone Encounter (Signed)
Noted.   Rx was filled per pt requests.. I did advise him that typically it's filled for 30 day supply.

## 2013-12-11 ENCOUNTER — Telehealth: Payer: Self-pay | Admitting: Family

## 2013-12-11 DIAGNOSIS — E041 Nontoxic single thyroid nodule: Secondary | ICD-10-CM

## 2013-12-11 NOTE — Telephone Encounter (Signed)
Biopsy report notes await pathology. Pt aware we have not received the pathology report. I will look into it 12/12/13

## 2013-12-11 NOTE — Telephone Encounter (Signed)
Pt would like a call back about biopsy results

## 2013-12-12 NOTE — Telephone Encounter (Signed)
Spoke with pt

## 2013-12-12 NOTE — Telephone Encounter (Signed)
Referral placed.

## 2013-12-12 NOTE — Telephone Encounter (Signed)
Advised pt that results were released to MyChart. Pt requesting referral to Endocrinology

## 2013-12-12 NOTE — Addendum Note (Signed)
Addended by: Santiago Bumpers on: 12/12/2013 04:35 PM   Modules accepted: Orders

## 2013-12-22 ENCOUNTER — Ambulatory Visit (INDEPENDENT_AMBULATORY_CARE_PROVIDER_SITE_OTHER): Payer: BC Managed Care – PPO | Admitting: Endocrinology

## 2013-12-22 ENCOUNTER — Encounter: Payer: Self-pay | Admitting: Endocrinology

## 2013-12-22 VITALS — BP 116/72 | HR 73 | Temp 98.8°F | Ht 75.0 in | Wt 297.0 lb

## 2013-12-22 DIAGNOSIS — M2749 Other cysts of jaw: Secondary | ICD-10-CM

## 2013-12-22 DIAGNOSIS — M2740 Unspecified cyst of jaw: Secondary | ICD-10-CM

## 2013-12-22 NOTE — Progress Notes (Signed)
Subjective:    Patient ID: Robert [name], male    DOB: 07-11-1975, 38 y.o.   MRN: [id number]  HPI 6 weeks ago, pt was incidentally noted to have a nodule at the thyroid, in the eval of a dental problem.  He has no h/o XRT to the neck.  He has slight swelling at the right anterior neck, but no assoc pain. Past Medical History  Diagnosis Date  . Chronic sinusitis   . ADD (attention deficit disorder) without hyperactivity     according to his mother/ work test possible    Past Surgical History  Procedure Laterality Date  . Nasal sinus surgery      History   Social History  . Marital Status: Married    Spouse Name: N/A    Number of Children: N/A  . Years of Education: N/A   Occupational History  . Not on file.   Social History Main Topics  . Smoking status: Never Smoker   . Smokeless tobacco: Not on file  . Alcohol Use: Yes     Comment: social  . Drug Use: No  . Sexual Activity: Yes   Other Topics Concern  . Not on file   Social History Narrative   Works time [employer] of 1 pets dog and cats.    Current Outpatient Prescriptions on File Prior to Visit  Medication Sig Dispense Refill  . ALPRAZolam (XANAX) 0.25 MG tablet TAKE 1 TABLET BY MOUTH THREE TIMES DAILY AS NEEDED FOR ANXIETY  60 tablet  0  . lisdexamfetamine (VYVANSE) 60 MG capsule Take 1 capsule (60 mg total) by mouth every morning.  90 capsule  0  . Lorcaserin HCl (BELVIQ) 10 MG TABS Take 1 tablet by mouth 2 (two) times daily.  60 tablet  4  . Triamcinolone Acetonide (TRIAMCINOLONE 0.1 % CREAM : EUCERIN) CREA Apply 1 application topically 2 (two) times daily.  60 each  0   No current facility-administered medications on file prior to visit.    Allergies  Allergen Reactions  . Penicillins Anaphylaxis  . Topamax Other (See Comments)    Is allergic when taking while drinking alcohol    Family History  Problem Relation Age of Onset  . Hyperlipidemia    . Hypertension Mother   . Thyroid disease  Neg Hx     BP 116/72  Pulse 73  Temp(Src) 98.8 F (37.1 C) (Oral)  Ht 6\' 3"  (1.905 m)  Wt 297 lb (134.718 kg)  BMI 37.12 kg/m2  SpO2 97%   Review of Systems  Denies dysphagia, weight change, and sob.      Objective:   Physical Exam VITAL SIGNS:  See vs page.   GENERAL: no distress.  Neck: 3 cm right thyroid nodule, freely mobile.  No palpable lymphadenopathy at the neck.    Lab Results  Component Value Date   TSH 0.75 11/28/2013   T4TOTAL 7.3 08/18/2006   i reviewed thyroid cytology, and I gave pt a copy.  Radiol: i reviewed thyroid US report  i have reviewed the following outside records: Office notes    Assessment & Plan:  Multinodular goiter, new  Patient is advised the following: Patient Instructions  Please come back for a follow-up appointment in 6 months.  most of the time, a "lumpy thyroid" will eventually become overactive.  this is usually a slow process, happening over the span of many years. blood tests are being requested for you today.  We'll contact you  with results.

## 2013-12-22 NOTE — Patient Instructions (Addendum)
Please come back for a follow-up appointment in 6 months.  most of the time, a "lumpy thyroid" will eventually become overactive.  this is usually a slow process, happening over the span of many years. blood tests are being requested for you today.  We'll contact you with results.

## 2013-12-25 LAB — PTH, INTACT AND CALCIUM
Calcium: 9.2 mg/dL (ref 8.4–10.5)
PTH: 27 pg/mL (ref 14–64)

## 2014-01-01 ENCOUNTER — Telehealth: Payer: Self-pay | Admitting: Family

## 2014-01-01 NOTE — Telephone Encounter (Signed)
Pt needs appointment. Also, if Rx can only be signed by MD, per pt, then he should consider establishing with an MD.

## 2014-01-01 NOTE — Telephone Encounter (Signed)
Pt stated he needs vyvanse 60 mg #90 must be sign by MD not NP.

## 2014-01-03 NOTE — Telephone Encounter (Signed)
lmom for pt to cb

## 2014-01-04 NOTE — Telephone Encounter (Signed)
Ok to establish - needs npv, but please let him know I don't usually prescribe controlled medications for adult ADD  or prescribe xanax long term in adult pts so would refer for this if he sees me for his general medical care.

## 2014-01-04 NOTE — Telephone Encounter (Signed)
Pt woud like to est with dr Maudie Mercury. Can I sch?

## 2014-01-09 NOTE — Telephone Encounter (Signed)
Pt is going to establish with [facility] and has been set up for an appt

## 2014-01-11 ENCOUNTER — Ambulatory Visit (INDEPENDENT_AMBULATORY_CARE_PROVIDER_SITE_OTHER): Payer: BC Managed Care – PPO | Admitting: Family Medicine

## 2014-01-11 ENCOUNTER — Encounter: Payer: Self-pay | Admitting: Family Medicine

## 2014-01-11 VITALS — BP 130/80 | HR 60 | Temp 98.0°F | Wt 299.0 lb

## 2014-01-11 DIAGNOSIS — E669 Obesity, unspecified: Secondary | ICD-10-CM | POA: Insufficient documentation

## 2014-01-11 DIAGNOSIS — F900 Attention-deficit hyperactivity disorder, predominantly inattentive type: Secondary | ICD-10-CM

## 2014-01-11 DIAGNOSIS — F988 Other specified behavioral and emotional disorders with onset usually occurring in childhood and adolescence: Secondary | ICD-10-CM

## 2014-01-11 DIAGNOSIS — F411 Generalized anxiety disorder: Secondary | ICD-10-CM | POA: Insufficient documentation

## 2014-01-11 DIAGNOSIS — E041 Nontoxic single thyroid nodule: Secondary | ICD-10-CM | POA: Insufficient documentation

## 2014-01-11 DIAGNOSIS — F909 Attention-deficit hyperactivity disorder, unspecified type: Secondary | ICD-10-CM

## 2014-01-11 MED ORDER — LISDEXAMFETAMINE DIMESYLATE 60 MG PO CAPS: 60.0000 mg | ORAL_CAPSULE | ORAL | Status: DC

## 2014-01-11 NOTE — Assessment & Plan Note (Signed)
Well controlled. Continue vyvanse-refilled x 6 months.

## 2014-01-11 NOTE — Progress Notes (Signed)
  Garret Reddish, MD Phone: [phone]  Subjective:   Robert [name] is a 38 y.o. year old very pleasant male patient who presents with the following:  ADHD-controlled Diagnosed 5 years ago by Dr. Arnoldo Morale. Affects him at home and work. Started out with adderrall but sexual side effects stopped it then moved to adderrall XR (wasn't lasting long enough). Stable on vyvanse.   ROS- no insomnia, no chest pain, shortness of breath, or palpitations.   Past Medical History- Patient Active Problem List   Diagnosis Date Noted  . Anxiety state 01/11/2014    Priority: Medium  . ADHD (attention deficit hyperactivity disorder) 06/02/2011    Priority: Medium  . Thyroid nodule 01/11/2014    Priority: Low  . Cyst of mandible 12/22/2013    Priority: Low  . Morbid obesity 01/11/2014   Medications- reviewed and updated Current Outpatient Prescriptions  Medication Sig Dispense Refill  . lisdexamfetamine (VYVANSE) 60 MG capsule Take 1 capsule (60 mg total) by mouth every morning. May fill 04/08/2014  90 capsule  0  . Lorcaserin HCl (BELVIQ) 10 MG TABS Take 1 tablet by mouth 2 (two) times daily.  60 tablet  4  . ALPRAZolam (XANAX) 0.25 MG tablet TAKE 1 TABLET BY MOUTH THREE TIMES DAILY AS NEEDED FOR ANXIETY  60 tablet  0  . Triamcinolone Acetonide (TRIAMCINOLONE 0.1 % CREAM : EUCERIN) CREA Apply 1 application topically 2 (two) times daily.  60 each  0   No current facility-administered medications for this visit.    Objective: BP 130/80  Pulse 60  Temp(Src) 98 F (36.7 C)  Wt 299 lb (135.626 kg) Gen: NAD, resting comfortably in chair CV: RRR no murmurs rubs or gallops Lungs: CTAB no crackles, wheeze, rhonchi Abdomen: soft/nontender/nondistended/normal bowel sounds.  Ext: no edema Skin: warm, dry, no rash Neuro: grossly normal, moves all extremities  Assessment/Plan:  ADHD (attention deficit hyperactivity disorder) Well controlled. Continue vyvanse-refilled x 6 months.   Morbid  obesity If not down at least 15 lbs by march will need to d/c belviq. Encouraged regular exercise and healthy eating.   Discussed cardiac risk on adhd and will need to minimize risk factors through weight loss.   Meds ordered this encounter  Medications  .  lisdexamfetamine (VYVANSE) 60 MG capsule    Sig: Take 1 capsule (60 mg total) by mouth every morning.    Dispense:  90 capsule    Refill:  0  . lisdexamfetamine (VYVANSE) 60 MG capsule    Sig: Take 1 capsule (60 mg total) by mouth every morning. May fill 04/08/2014    Dispense:  90 capsule    Refill:  0

## 2014-01-11 NOTE — Assessment & Plan Note (Signed)
If not down at least 15 lbs by march will need to d/c belviq. Encouraged regular exercise and healthy eating.

## 2014-01-11 NOTE — Patient Instructions (Signed)
Great to see you today.   Refilled vyvanse x 6 months.   See you in March-need to assess if belviq is helping at that time. If not 5% weight loss at least then would need to d/c.

## 2014-01-30 ENCOUNTER — Other Ambulatory Visit: Payer: Self-pay | Admitting: Otolaryngology

## 2014-02-06 ENCOUNTER — Other Ambulatory Visit: Payer: Self-pay | Admitting: Family Medicine

## 2014-02-06 DIAGNOSIS — F988 Other specified behavioral and emotional disorders with onset usually occurring in childhood and adolescence: Secondary | ICD-10-CM

## 2014-02-06 MED ORDER — LISDEXAMFETAMINE DIMESYLATE 60 MG PO CAPS: 60.0000 mg | ORAL_CAPSULE | ORAL | Status: DC

## 2014-02-06 MED ORDER — LISDEXAMFETAMINE DIMESYLATE 60 MG PO CAPS: ORAL_CAPSULE | ORAL | Status: DC

## 2014-02-13 ENCOUNTER — Other Ambulatory Visit: Payer: Self-pay | Admitting: *Deleted

## 2014-02-13 DIAGNOSIS — F988 Other specified behavioral and emotional disorders with onset usually occurring in childhood and adolescence: Secondary | ICD-10-CM

## 2014-02-13 MED ORDER — LISDEXAMFETAMINE DIMESYLATE 60 MG PO CAPS: 60.0000 mg | ORAL_CAPSULE | ORAL | Status: DC

## 2014-02-17 NOTE — Pre-Procedure Instructions (Signed)
Robert [name]  02/17/2014   Your procedure is scheduled on:  December 2  Report to [facility] Admitting at 08:00. AM.  Call this number if you have problems the morning of surgery: [phone]   Remember:   Do not eat food or drink liquids after midnight.   Take these medicines the morning of surgery with A SIP OF WATER: Xanax (if needed), Vyvanse    STOP Belviq today   STOP/ Do not take Aspirin, Aleve, Naproxen, Advil, Ibuprofen, Motrin, Vitamins, Herbs, or Supplements starting today   Do not wear jewelry, make-up or nail polish.  Do not wear lotions, powders, or perfumes. You may wear deodorant.  Do not shave 48 hours prior to surgery. Men may shave face and neck.  Do not bring valuables to the hospital.  [facility] is not responsible for any belongings or valuables.               Contacts, dentures or bridgework may not be worn into surgery.  Leave suitcase in the car. After surgery it may be brought to your room.  For patients admitted to the hospital, discharge time is determined by your treatment team.               Patients discharged the day of surgery will not be allowed to drive home.  Name and phone number of your driver: Family/ Friend  Special Instructions: [place] - Preparing for Surgery  Before surgery, you can play an important role.  Because skin is not sterile, your skin needs to be as free of germs as possible.  You can reduce the number of germs on you skin by washing with CHG (chlorahexidine gluconate) soap before surgery.  CHG is an antiseptic cleaner which kills germs and bonds with the skin to continue killing germs even after washing.  Please DO NOT use if you have an allergy to CHG or antibacterial soaps.  If your skin becomes reddened/irritated stop using the CHG and inform your nurse when you arrive at Short Stay.  Do not shave (including legs and underarms) for at least 48 hours prior to the first CHG shower.  You may shave your  face.  Please follow these instructions carefully:   1.  Shower with CHG Soap the night before surgery and the morning of Surgery.  2.  If you choose to wash your hair, wash your hair first as usual with your normal shampoo.  3.  After you shampoo, rinse your hair and body thoroughly to remove the shampoo.  4.  Use CHG as you would any other liquid soap.  You can apply CHG directly to the skin and wash gently with scrungie or a clean washcloth.  5.  Apply the CHG Soap to your body ONLY FROM THE NECK DOWN.  Do not use on open wounds or open sores.  Avoid contact with your eyes, ears, mouth and genitals (private parts).  Wash genitals (private parts) with your normal soap.  6.  Wash thoroughly, paying special attention to the area where your surgery will be performed.  7.  Thoroughly rinse your body with warm water from the neck down.  8.  DO NOT shower/wash with your normal soap after using and rinsing off the CHG Soap.  9.  Pat yourself dry with a clean towel.            10.  Wear clean pajamas.  11.  Place clean sheets on your bed the night of your first shower and do not sleep with pets.  Day of Surgery  Do not apply any lotions the morning of surgery.  Please wear clean clothes to the hospital/surgery center.     Please read over the following fact sheets that you were given: Pain Booklet, Coughing and Deep Breathing and Surgical Site Infection Prevention

## 2014-02-17 NOTE — Progress Notes (Signed)
Per Dr. Al Corpus patient needs to stop Belviq. Note left for PAT nurse to inform patient of same during PAT visit.

## 2014-02-19 ENCOUNTER — Encounter (HOSPITAL_COMMUNITY)
Admission: RE | Admit: 2014-02-19 | Discharge: 2014-02-19 | Disposition: A | Discharge status: Home / Self Care | Payer: BC Managed Care – PPO | Source: Ambulatory Visit | Attending: Otolaryngology | Admitting: Otolaryngology

## 2014-02-19 ENCOUNTER — Encounter (HOSPITAL_COMMUNITY): Payer: Self-pay

## 2014-02-19 ENCOUNTER — Encounter (HOSPITAL_COMMUNITY)
Admission: RE | Admit: 2014-02-19 | Discharge: 2014-02-19 | Disposition: A | Discharge status: Home / Self Care | Payer: BC Managed Care – PPO | Source: Ambulatory Visit | Attending: Anesthesiology | Admitting: Anesthesiology

## 2014-02-19 DIAGNOSIS — E079 Disorder of thyroid, unspecified: Secondary | ICD-10-CM

## 2014-02-19 DIAGNOSIS — D34 Benign neoplasm of thyroid gland: Secondary | ICD-10-CM | POA: Diagnosis not present

## 2014-02-19 DIAGNOSIS — G4730 Sleep apnea, unspecified: Secondary | ICD-10-CM | POA: Diagnosis not present

## 2014-02-19 DIAGNOSIS — Z91011 Allergy to milk products: Secondary | ICD-10-CM | POA: Diagnosis not present

## 2014-02-19 DIAGNOSIS — E0789 Other specified disorders of thyroid: Secondary | ICD-10-CM | POA: Diagnosis present

## 2014-02-19 DIAGNOSIS — Z880 Allergy status to penicillin: Secondary | ICD-10-CM | POA: Diagnosis not present

## 2014-02-19 DIAGNOSIS — Z888 Allergy status to other drugs, medicaments and biological substances status: Secondary | ICD-10-CM | POA: Diagnosis not present

## 2014-02-19 HISTORY — DX: Sleep apnea, unspecified: G47.30

## 2014-02-19 LAB — CBC
HCT: 45.9 % (ref 39.0–52.0)
Hemoglobin: 16.2 g/dL (ref 13.0–17.0)
MCH: 30.1 pg (ref 26.0–34.0)
MCHC: 35.3 g/dL (ref 30.0–36.0)
MCV: 85.3 fL (ref 78.0–100.0)
Platelets: 269 10*3/uL (ref 150–400)
RBC: 5.38 MIL/uL (ref 4.22–5.81)
RDW: 12.5 % (ref 11.5–15.5)
WBC: 6.8 10*3/uL (ref 4.0–10.5)

## 2014-02-28 ENCOUNTER — Encounter (HOSPITAL_COMMUNITY): Payer: Self-pay | Admitting: General Practice

## 2014-02-28 ENCOUNTER — Encounter (HOSPITAL_COMMUNITY)
Admission: RE | Disposition: A | Discharge status: Home / Self Care | Payer: Self-pay | Source: Ambulatory Visit | Attending: Otolaryngology

## 2014-02-28 ENCOUNTER — Ambulatory Visit (HOSPITAL_COMMUNITY)
Admission: RE | Admit: 2014-02-28 | Discharge: 2014-03-01 | Disposition: A | Discharge status: Home / Self Care | Payer: BC Managed Care – PPO | Source: Ambulatory Visit | Attending: Otolaryngology | Admitting: Otolaryngology

## 2014-02-28 ENCOUNTER — Ambulatory Visit (HOSPITAL_COMMUNITY): Payer: BC Managed Care – PPO | Admitting: Anesthesiology

## 2014-02-28 DIAGNOSIS — Z880 Allergy status to penicillin: Secondary | ICD-10-CM | POA: Insufficient documentation

## 2014-02-28 DIAGNOSIS — G4730 Sleep apnea, unspecified: Secondary | ICD-10-CM | POA: Insufficient documentation

## 2014-02-28 DIAGNOSIS — Z91011 Allergy to milk products: Secondary | ICD-10-CM | POA: Insufficient documentation

## 2014-02-28 DIAGNOSIS — Z9889 Other specified postprocedural states: Secondary | ICD-10-CM

## 2014-02-28 DIAGNOSIS — Z888 Allergy status to other drugs, medicaments and biological substances status: Secondary | ICD-10-CM | POA: Insufficient documentation

## 2014-02-28 DIAGNOSIS — E890 Postprocedural hypothyroidism: Secondary | ICD-10-CM

## 2014-02-28 DIAGNOSIS — D34 Benign neoplasm of thyroid gland: Secondary | ICD-10-CM | POA: Insufficient documentation

## 2014-02-28 HISTORY — DX: Headache: R51

## 2014-02-28 HISTORY — DX: Headache, unspecified: R51.9

## 2014-02-28 HISTORY — PX: THYROIDECTOMY: SHX17

## 2014-02-28 HISTORY — PX: THYROID SURGERY: SHX805

## 2014-02-28 SURGERY — THYROIDECTOMY
Anesthesia: General | Site: Neck | Laterality: Right

## 2014-02-28 MED ORDER — PROPOFOL 10 MG/ML IV BOLUS
INTRAVENOUS | Status: DC | PRN
  Administered 2014-02-28: 125 mg via INTRAVENOUS

## 2014-02-28 MED ORDER — OXYCODONE-ACETAMINOPHEN 5-325 MG PO TABS
1.0000 | ORAL_TABLET | ORAL | Status: DC | PRN
  Administered 2014-02-28: 2 via ORAL
  Filled 2014-02-28: qty 2

## 2014-02-28 MED ORDER — SUCCINYLCHOLINE CHLORIDE 20 MG/ML IJ SOLN
INTRAMUSCULAR | Status: AC
  Filled 2014-02-28: qty 2

## 2014-02-28 MED ORDER — EPHEDRINE SULFATE 50 MG/ML IJ SOLN
INTRAMUSCULAR | Status: AC
  Filled 2014-02-28: qty 1

## 2014-02-28 MED ORDER — PROMETHAZINE HCL 25 MG RE SUPP: 25.0000 mg | Freq: Four times a day (QID) | RECTAL | Status: DC | PRN

## 2014-02-28 MED ORDER — MEPERIDINE HCL 25 MG/ML IJ SOLN
6.2500 mg | INTRAMUSCULAR | Status: DC | PRN
Start: 2014-02-28 — End: 2014-02-28

## 2014-02-28 MED ORDER — OXYCODONE-ACETAMINOPHEN 5-325 MG PO TABS: 1.0000 | ORAL_TABLET | ORAL | Status: DC | PRN

## 2014-02-28 MED ORDER — LIDOCAINE HCL (CARDIAC) 20 MG/ML IV SOLN
INTRAVENOUS | Status: DC | PRN
  Administered 2014-02-28: 50 mg via INTRAVENOUS

## 2014-02-28 MED ORDER — OXYCODONE HCL 5 MG/5ML PO SOLN: 5.0000 mg | Freq: Once | ORAL | Status: DC | PRN

## 2014-02-28 MED ORDER — ALPRAZOLAM 0.25 MG PO TABS: 0.2500 mg | ORAL_TABLET | Freq: Three times a day (TID) | ORAL | Status: DC | PRN

## 2014-02-28 MED ORDER — LISDEXAMFETAMINE DIMESYLATE 30 MG PO CAPS
60.0000 mg | ORAL_CAPSULE | Freq: Every day | ORAL | Status: DC
Start: 2014-03-01 — End: 2014-03-01

## 2014-02-28 MED ORDER — ONDANSETRON HCL 4 MG/2ML IJ SOLN
4.0000 mg | Freq: Once | INTRAMUSCULAR | Status: AC | PRN
  Administered 2014-02-28: 4 mg via INTRAVENOUS

## 2014-02-28 MED ORDER — ARTIFICIAL TEARS OP OINT
TOPICAL_OINTMENT | OPHTHALMIC | Status: DC | PRN
  Administered 2014-02-28: 1 via OPHTHALMIC

## 2014-02-28 MED ORDER — LACTATED RINGERS IV SOLN
INTRAVENOUS | Status: DC | PRN
  Administered 2014-02-28 (×2): via INTRAVENOUS

## 2014-02-28 MED ORDER — 0.9 % SODIUM CHLORIDE (POUR BTL) OPTIME
TOPICAL | Status: DC | PRN
  Administered 2014-02-28: 1000 mL

## 2014-02-28 MED ORDER — KCL IN DEXTROSE-NACL 20-5-0.45 MEQ/L-%-% IV SOLN
INTRAVENOUS | Status: DC
  Administered 2014-02-28 – 2014-03-01 (×2): via INTRAVENOUS
  Filled 2014-02-28 (×4): qty 1000

## 2014-02-28 MED ORDER — HYDROMORPHONE HCL 1 MG/ML IJ SOLN
0.2500 mg | INTRAMUSCULAR | Status: DC | PRN
  Administered 2014-02-28 (×4): 0.5 mg via INTRAVENOUS

## 2014-02-28 MED ORDER — PROPOFOL 10 MG/ML IV BOLUS
INTRAVENOUS | Status: AC
  Filled 2014-02-28: qty 20

## 2014-02-28 MED ORDER — ONDANSETRON HCL 4 MG/2ML IJ SOLN
INTRAMUSCULAR | Status: DC | PRN
Start: 2014-02-28 — End: 2014-02-28
  Administered 2014-02-28: 4 mg via INTRAVENOUS

## 2014-02-28 MED ORDER — ARTIFICIAL TEARS OP OINT
TOPICAL_OINTMENT | OPHTHALMIC | Status: AC
  Filled 2014-02-28: qty 3.5

## 2014-02-28 MED ORDER — FENTANYL CITRATE 0.05 MG/ML IJ SOLN
INTRAMUSCULAR | Status: DC | PRN
  Administered 2014-02-28: 50 ug via INTRAVENOUS
  Administered 2014-02-28: 150 ug via INTRAVENOUS
  Administered 2014-02-28: 50 ug via INTRAVENOUS
  Administered 2014-02-28: 100 ug via INTRAVENOUS

## 2014-02-28 MED ORDER — LABETALOL HCL 5 MG/ML IV SOLN
INTRAVENOUS | Status: DC | PRN
  Administered 2014-02-28 (×4): 2.5 mg via INTRAVENOUS

## 2014-02-28 MED ORDER — SODIUM CHLORIDE 0.9 % IJ SOLN
INTRAMUSCULAR | Status: AC
  Filled 2014-02-28: qty 10

## 2014-02-28 MED ORDER — CLINDAMYCIN PHOSPHATE 300 MG/50ML IV SOLN
300.0000 mg | Freq: Three times a day (TID) | INTRAVENOUS | Status: DC
  Administered 2014-02-28 – 2014-03-01 (×2): 300 mg via INTRAVENOUS
  Filled 2014-02-28 (×3): qty 50

## 2014-02-28 MED ORDER — CLINDAMYCIN HCL 300 MG PO CAPS: 300.0000 mg | ORAL_CAPSULE | Freq: Three times a day (TID) | ORAL | Status: DC

## 2014-02-28 MED ORDER — FENTANYL CITRATE 0.05 MG/ML IJ SOLN
INTRAMUSCULAR | Status: AC
  Filled 2014-02-28: qty 5

## 2014-02-28 MED ORDER — MIDAZOLAM HCL 5 MG/5ML IJ SOLN
INTRAMUSCULAR | Status: DC | PRN
  Administered 2014-02-28: 2 mg via INTRAVENOUS

## 2014-02-28 MED ORDER — LIDOCAINE-EPINEPHRINE 1 %-1:100000 IJ SOLN
INTRAMUSCULAR | Status: AC
  Filled 2014-02-28: qty 1

## 2014-02-28 MED ORDER — MORPHINE SULFATE 2 MG/ML IJ SOLN
2.0000 mg | INTRAMUSCULAR | Status: DC | PRN
  Administered 2014-02-28: 2 mg via INTRAVENOUS
  Filled 2014-02-28: qty 1

## 2014-02-28 MED ORDER — CLINDAMYCIN PHOSPHATE 900 MG/50ML IV SOLN
900.0000 mg | INTRAVENOUS | Status: AC
  Administered 2014-02-28: 900 mg via INTRAVENOUS
  Filled 2014-02-28: qty 50

## 2014-02-28 MED ORDER — DEXAMETHASONE SODIUM PHOSPHATE 4 MG/ML IJ SOLN
INTRAMUSCULAR | Status: AC
  Filled 2014-02-28: qty 2

## 2014-02-28 MED ORDER — LIDOCAINE HCL (CARDIAC) 20 MG/ML IV SOLN
INTRAVENOUS | Status: AC
  Filled 2014-02-28: qty 15

## 2014-02-28 MED ORDER — SUCCINYLCHOLINE CHLORIDE 20 MG/ML IJ SOLN
INTRAMUSCULAR | Status: DC | PRN
  Administered 2014-02-28: 140 mg via INTRAVENOUS

## 2014-02-28 MED ORDER — LORCASERIN HCL 10 MG PO TABS: 1.0000 | ORAL_TABLET | Freq: Two times a day (BID) | ORAL | Status: DC

## 2014-02-28 MED ORDER — PROMETHAZINE HCL 25 MG PO TABS
25.0000 mg | ORAL_TABLET | Freq: Four times a day (QID) | ORAL | Status: DC | PRN
  Administered 2014-02-28: 25 mg via ORAL
  Filled 2014-02-28: qty 1

## 2014-02-28 MED ORDER — LIDOCAINE-EPINEPHRINE 1 %-1:100000 IJ SOLN
INTRAMUSCULAR | Status: DC | PRN
  Administered 2014-02-28: 5 mL

## 2014-02-28 MED ORDER — MIDAZOLAM HCL 2 MG/2ML IJ SOLN
INTRAMUSCULAR | Status: AC
  Filled 2014-02-28: qty 2

## 2014-02-28 MED ORDER — LACTATED RINGERS IV SOLN
INTRAVENOUS | Status: DC
  Administered 2014-02-28: 09:00:00 via INTRAVENOUS

## 2014-02-28 MED ORDER — LABETALOL HCL 5 MG/ML IV SOLN
INTRAVENOUS | Status: AC
  Filled 2014-02-28: qty 4

## 2014-02-28 MED ORDER — HYDROMORPHONE HCL 1 MG/ML IJ SOLN
INTRAMUSCULAR | Status: AC
  Administered 2014-02-28: 0.5 mg via INTRAVENOUS
  Filled 2014-02-28: qty 2

## 2014-02-28 MED ORDER — DEXAMETHASONE SODIUM PHOSPHATE 10 MG/ML IJ SOLN
INTRAMUSCULAR | Status: DC | PRN
  Administered 2014-02-28: 8 mg via INTRAVENOUS

## 2014-02-28 MED ORDER — OXYCODONE HCL 5 MG PO TABS
5.0000 mg | ORAL_TABLET | Freq: Once | ORAL | Status: DC | PRN
Start: 2014-02-28 — End: 2014-02-28

## 2014-02-28 MED ORDER — ONDANSETRON HCL 4 MG/2ML IJ SOLN
INTRAMUSCULAR | Status: AC
  Filled 2014-02-28: qty 4

## 2014-02-28 MED ORDER — ROCURONIUM BROMIDE 50 MG/5ML IV SOLN
INTRAVENOUS | Status: AC
  Filled 2014-02-28: qty 1

## 2014-02-28 MED ORDER — ONDANSETRON HCL 4 MG/2ML IJ SOLN
INTRAMUSCULAR | Status: AC
  Filled 2014-02-28: qty 2

## 2014-02-28 SURGICAL SUPPLY — 45 items
ATTRACTOMAT 16X20 MAGNETIC DRP (DRAPES) ×3 IMPLANT
BENZOIN TINCTURE PRP APPL 2/3 (GAUZE/BANDAGES/DRESSINGS) IMPLANT
CANISTER SUCTION 2500CC (MISCELLANEOUS) ×3 IMPLANT
CLEANER TIP ELECTROSURG 2X2 (MISCELLANEOUS) ×3 IMPLANT
CONT SPEC 4OZ CLIKSEAL STRL BL (MISCELLANEOUS) ×3 IMPLANT
CORDS BIPOLAR (ELECTRODE) ×3 IMPLANT
COVER SURGICAL LIGHT HANDLE (MISCELLANEOUS) ×3 IMPLANT
CRADLE DONUT ADULT HEAD (MISCELLANEOUS) ×3 IMPLANT
DRAIN CHANNEL 10F 3/8 F FF (DRAIN) ×3 IMPLANT
DRAPE PROXIMA HALF (DRAPES) ×3 IMPLANT
ELECT COATED BLADE 2.86 ST (ELECTRODE) ×3 IMPLANT
ELECT REM PT RETURN 9FT ADLT (ELECTROSURGICAL) ×3
ELECTRODE REM PT RTRN 9FT ADLT (ELECTROSURGICAL) ×1 IMPLANT
EVACUATOR SILICONE 100CC (DRAIN) ×3 IMPLANT
GAUZE SPONGE 4X4 16PLY XRAY LF (GAUZE/BANDAGES/DRESSINGS) ×12 IMPLANT
GLOVE BIO SURGEON STRL SZ 6.5 (GLOVE) ×2 IMPLANT
GLOVE BIO SURGEON STRL SZ7 (GLOVE) ×3 IMPLANT
GLOVE BIO SURGEONS STRL SZ 6.5 (GLOVE) ×1
GLOVE BIOGEL PI IND STRL 6.5 (GLOVE) ×1 IMPLANT
GLOVE BIOGEL PI IND STRL 7.0 (GLOVE) ×1 IMPLANT
GLOVE BIOGEL PI INDICATOR 6.5 (GLOVE) ×2
GLOVE BIOGEL PI INDICATOR 7.0 (GLOVE) ×2
GLOVE ECLIPSE 7.5 STRL STRAW (GLOVE) ×3 IMPLANT
GLOVE SURG SS PI 7.0 STRL IVOR (GLOVE) ×3 IMPLANT
GOWN STRL REUS W/ TWL LRG LVL3 (GOWN DISPOSABLE) ×3 IMPLANT
GOWN STRL REUS W/TWL LRG LVL3 (GOWN DISPOSABLE) ×6
HEMOSTAT SURGICEL 2X14 (HEMOSTASIS) IMPLANT
KIT BASIN OR (CUSTOM PROCEDURE TRAY) ×3 IMPLANT
KIT ROOM TURNOVER OR (KITS) ×3 IMPLANT
LIQUID BAND (GAUZE/BANDAGES/DRESSINGS) ×3 IMPLANT
LOCATOR NERVE 3 VOLT (DISPOSABLE) ×3 IMPLANT
NS IRRIG 1000ML POUR BTL (IV SOLUTION) ×3 IMPLANT
PAD ARMBOARD 7.5X6 YLW CONV (MISCELLANEOUS) ×3 IMPLANT
PENCIL BUTTON HOLSTER BLD 10FT (ELECTRODE) ×3 IMPLANT
PROBE NERVBE PRASS .33 (MISCELLANEOUS) ×3 IMPLANT
SHEARS HARMONIC 9CM CVD (BLADE) ×3 IMPLANT
SPONGE INTESTINAL PEANUT (DISPOSABLE) ×3 IMPLANT
SUT ETHILON 2 0 FS 18 (SUTURE) ×3 IMPLANT
SUT PROLENE 6 0 CC 1 (SUTURE) IMPLANT
SUT SILK 2 0 FS (SUTURE) IMPLANT
SUT SILK 3 0 REEL (SUTURE) ×3 IMPLANT
SUT VICRYL 4-0 PS2 18IN ABS (SUTURE) ×9 IMPLANT
TOWEL OR 17X26 10 PK STRL BLUE (TOWEL DISPOSABLE) ×3 IMPLANT
TRAY ENT MC OR (CUSTOM PROCEDURE TRAY) ×3 IMPLANT
TUBE ENDOTRAC EMG 8X11.3 (MISCELLANEOUS) ×3 IMPLANT

## 2014-02-28 NOTE — Discharge Instructions (Signed)
Thyroidectomy Care After Refer to this sheet in the next few weeks. These instructions provide you with general information on caring for yourself after you leave the hospital. Your caregiver also may give you specific instructions. Your treatment has been planned according to the most current medical practices available, but problems sometimes occur. Call your caregiver if you have any problems or questions after your procedure. HOME CARE INSTRUCTIONS   It is normal to be sore for a few weeks following surgery. See your caregiver if your pain seems to be getting worse rather than better.  Only take over-the-counter or prescription medicines for pain, discomfort, or fever as directed by your caregiver. Avoid taking medicines that contain aspirin and ibuprofen because they increase the risk of bleeding.  You may resume a normal diet and activities as directed by your caregiver.  Avoid lifting weight greater than 20 lb (9 kg) or participating in heavy exercise or contact sports for 10 days or as instructed by your caregiver.  Make an appointment to see your caregiver for stitch (suture) or staple removal. SEEK MEDICAL CARE IF:   You have increased bleeding from your wound.  You have redness, swelling, or increasing pain from your wound or in your neck.  There is pus coming from your wound.  You have an oral temperature above 102 F (38.9 C).  There is a bad smell coming from the wound or dressing.  You develop lightheadedness or feel faint.  You develop numbness, tingling, or muscle spasms in your arms, hands, feet, or face.  You have difficulty swallowing. SEEK IMMEDIATE MEDICAL CARE IF:   You develop a rash.  You have difficulty breathing.  You hear whistling noises that come from your chest.  You develop a cough that becomes increasingly worse.  You develop any reaction or side effects to medicines given.  There is swelling in your neck.  You develop changes in speech  or hoarseness, which is getting worse. MAKE SURE YOU:   Understand these instructions.  Will watch your condition.  Will get help right away if you are not doing well or get worse. Document Released: 10/03/2004 Document Revised: 06/08/2011 Document Reviewed: 05/23/2010 [facility] Patient Information 2015 [place], [place]. This information is not intended to replace advice given to you by your health care provider. Make sure you discuss any questions you have with your health care provider.

## 2014-02-28 NOTE — Brief Op Note (Signed)
02/28/2014  12:16 PM  PATIENT:  Robert [name]  39 y.o. male  PRE-OPERATIVE DIAGNOSIS:  RIGHT THYROID MASS   POST-OPERATIVE DIAGNOSIS:  RIGHT THYROID MASS   PROCEDURE:  Procedure(s): RIGHT HEMITHYROIDECTOMY  SURGEON:  Surgeon(s) and Role:    * Ascencion Dike, MD - Primary  PHYSICIAN ASSISTANT:  Rometta Emery, PA-C  ASSISTANTS: none   ANESTHESIA:   general  EBL:  Total I/O In: 1000 [I.V.:1000] Out: 100 [Blood:100]  BLOOD ADMINISTERED:none  DRAINS: (#10) Jackson-Pratt drain(s) with closed bulb suction in the neck   LOCAL MEDICATIONS USED:  LIDOCAINE   SPECIMEN:  Source of Specimen:  Right thyroid lobe  DISPOSITION OF SPECIMEN:  PATHOLOGY  COUNTS:  YES  TOURNIQUET:  * No tourniquets in log *  DICTATION: .Other Dictation: Dictation Number [id number]  PLAN OF CARE: Admit for overnight observation  PATIENT DISPOSITION:  PACU - hemodynamically stable.   Delay start of Pharmacological VTE agent (>24hrs) due to surgical blood loss or risk of bleeding: not applicable

## 2014-02-28 NOTE — Transfer of Care (Signed)
Immediate Anesthesia Transfer of Care Note  Patient: Robert [name]  Procedure(s) Performed: Procedure(s): RIGHT HEMI THYROIDECTOMY (Right)  Patient Location: PACU  Anesthesia Type:General  Level of Consciousness: awake  Airway & Oxygen Therapy: Patient Spontanous Breathing and Patient connected to nasal cannula oxygen  Post-op Assessment: Report given to PACU RN, Post -op Vital signs reviewed and stable and Patient moving all extremities X 4  Post vital signs: Reviewed and stable  Complications: No apparent anesthesia complications

## 2014-02-28 NOTE — Anesthesia Preprocedure Evaluation (Addendum)
Anesthesia Evaluation  Patient identified by MRN, date of birth, ID band Patient awake    Reviewed: Allergy & Precautions, H&P , NPO status , Patient's Chart, lab work & pertinent test results  Airway Mallampati: II  TM Distance: >3 FB Neck ROM: Full    Dental  (+) Teeth Intact, Dental Advidsory Given   Pulmonary sleep apnea and Continuous Positive Airway Pressure Ventilation ,          Cardiovascular     Neuro/Psych PSYCHIATRIC DISORDERS    GI/Hepatic   Endo/Other    Renal/GU      Musculoskeletal   Abdominal   Peds  Hematology   Anesthesia Other Findings   Reproductive/Obstetrics                            Anesthesia Physical Anesthesia Plan  ASA: II  Anesthesia Plan: General   Post-op Pain Management:    Induction: Intravenous  Airway Management Planned: Oral ETT  Additional Equipment:   Intra-op Plan:   Post-operative Plan: Extubation in OR  Informed Consent:   Dental Advisory Given  Plan Discussed with: CRNA and Surgeon  Anesthesia Plan Comments:       Anesthesia Quick Evaluation

## 2014-02-28 NOTE — Anesthesia Postprocedure Evaluation (Signed)
  Anesthesia Post-op Note  Patient: Robert [name]  Procedure(s) Performed: Procedure(s): RIGHT HEMI THYROIDECTOMY (Right)  Patient Location: PACU  Anesthesia Type:General  Level of Consciousness: awake, alert , oriented and patient cooperative  Airway and Oxygen Therapy: Patient Spontanous Breathing  Post-op Pain: mild  Post-op Assessment: Post-op Vital signs reviewed, Patient's Cardiovascular Status Stable, Respiratory Function Stable, Patent Airway, No signs of Nausea or vomiting and Pain level controlled  Post-op Vital Signs: Reviewed and stable  Last Vitals:  Filed Vitals:   02/28/14 1345  BP: 143/78  Pulse: 79  Temp: 36.8 C  Resp: 14    Complications: No apparent anesthesia complications

## 2014-02-28 NOTE — Anesthesia Procedure Notes (Signed)
Procedure Name: Intubation Date/Time: 02/28/2014 10:09 AM Performed by: Neldon Newport Pre-anesthesia Checklist: Patient identified, Timeout performed, Emergency Drugs available, Suction available and Patient being monitored Patient Re-evaluated:Patient Re-evaluated prior to inductionOxygen Delivery Method: Circle system utilized Preoxygenation: Pre-oxygenation with 100% oxygen Intubation Type: IV induction and Rapid sequence Ventilation: Mask ventilation without difficulty Laryngoscope Size: Mac and 3 Grade View: Grade I Tube type: Oral (NIM medtronic tube) Tube size: 8.0 mm Number of attempts: 1 Placement Confirmation: positive ETCO2,  ETT inserted through vocal cords under direct vision and breath sounds checked- equal and bilateral Secured at: 23 cm Tube secured with: Tape Dental Injury: Teeth and Oropharynx as per pre-operative assessment

## 2014-02-28 NOTE — H&P (Signed)
Cc: Right thyroid mass  HPI: The patient is a 38 y/o male who presents today for evaluation of  a right thyroid nodule.  The patient was undergoing evaluation for oral surgery and the nodule was an incidental finding noted on neck imaging. The patient did have a neck ultrasound which showed a solitary 3.9 cm nodule in the mid and lower right thyroid gland.  The patient also had an FNA done which was consistent with a follicular lesion.  The patient complains of some compressive symptoms that tend to be positional.  He has a history of sleep apnea and uses a CPAP. He previously underwent septoplasty and turbinate reduction for chronic nasal congestion.    The patient's review of systems (constitutional, eyes, ENT, cardiovascular, respiratory, GI, musculoskeletal, skin, neurologic, psychiatric, endocrine, hematologic, allergic) is noted in the ROS questionnaire.  It is reviewed with the patient.   Allergies: Penicillin.   Family health history: Pancreatitis.   Major events: Septoplasty and turbinate reduction.   Ongoing medical problems: Thyroid disease.   Social history: The patient is single. He denies the use of tobacco or illegal drugs. He drinks alcohol on a social basis.  Exam: General: Communicates without difficulty, well nourished, no acute distress.. Head: Normocephalic, no evidence injury, no tenderness, facial buttresses intact without stepoff. Eyes: No scleral icterus, conjunctivae clear. Neuro: CN II exam reveals vision grossly intact.  No nystagmus at any point of gaze.. Ears: Auricles well formed without lesions.  Ear canals are intact without mass or lesion.  No erythema or edema is appreciated.  The TM is intact without fluid. Nose: External evaluation reveals normal support and skin without lesions.  Dorsum is intact.  Anterior rhinoscopy reveals healthy pink mucosa over anterior aspect of inferior turbinates and intact septum.  No purulence noted. Oral:  Oral cavity and oropharynx  are intact, symmetric, without erythema or edema.  Mucosa is moist without lesions. Neck: Full range of motion without pain.  There is no significant lymphadenopathy.  No masses palpable.  Palpable right thyroid nodule.  Parotid glands and submandibular glands equal bilaterally without mass.  Trachea is midline. Vestibular: No nystagmus at any point of gaze. The cerebellar examination is unremarkable.   Procedure:  Flexible Fiberoptic Laryngoscopy -- Risks, benefits, and alternatives of flexible endoscopy were explained to the patient.  Specific mention was made of the risk of throat numbness with difficulty swallowing, possible bleeding from the nose and mouth, and pain from the procedure.  The patient gave oral consent to proceed.  The nasal cavities were decongested and anesthetised with a combination of oxymetazoline and 4% lidocaine solution.  The flexible scope was inserted into the right nasal cavity and advanced towards the nasopharynx.  Visualized mucosa over the turbinates and septum were as described above.  The nasopharynx was clear.  Oropharyngeal walls were symmetric and mobile without lesion, mass, or edema.  Hypopharynx was also without  lesion or edema.  Larynx was mobile without lesions. Supraglottic structures were free of edema, mass, and asymmetry.  True vocal folds were white without mass or lesion.  Base of tongue was within normal limits.  The patient tolerated the procedure well.   Assessment 1.  A solitary 3.9 cm nodule is noted in the mid and lower right thyroid gland.  The patient also had an FNA done which was consistent with a follicular lesion.   2.  The patient is having mild compressive symptoms. Laryngoscopy evaluation showed no evidence of mass or lesion and vocal cords  are mobile.   Plan  1.  The physical exam, ultrasound, biopsy, and laryngoscopy findings are discussed with the patient at length.   2.  It is explained to the patient that a follicular thyroid lesion on  fine needle aspiration biopsy could indicate either a follicular adenoma or a follicular carcinoma. The only difference is the presence of vascular invasion or extracapsular spread for the carcinoma variant. Such features cannot be appreciated on a FNA specimen. However, follicular adenoma is significantly more common than follicular carcinoma. The only definitive way of determining the histology of the nodule is by excising the thyroid nodule via a hemithyroidectomy procedure. 3.  The patient would like to proceed with right hemithyroidectomy.  The procedure will be scheduled in accordance with the patient's schedule.

## 2014-02-28 NOTE — Op Note (Signed)
NAMEMOHAMMED, MCANDREW NO.:  [id number]  MEDICAL RECORD NO.:  [id number]  LOCATION:  6N29C                        FACILITY:  [facility]  PHYSICIAN:  Leta Baptist, MD            DATE OF BIRTH:  1976/01/28  DATE OF PROCEDURE:  02/28/2014 DATE OF DISCHARGE:                              OPERATIVE REPORT   SURGEON:  Leta Baptist, MD  ASSISTANT:  Rometta Emery, PA-C.  PREOPERATIVE DIAGNOSIS:  Right thyroid mass.  POSTOPERATIVE DIAGNOSIS:  Right thyroid mass.  PROCEDURE PERFORMED:  Right hemithyroidectomy.  ANESTHESIA:  General endotracheal tube anesthesia.  COMPLICATIONS:  None.  ESTIMATED BLOOD LOSS:  100 mL.  INDICATION FOR PROCEDURE:  The patient is a 38 year old male, who was recently noted to have large right thyroid mass on his neck imaging study.  His neck ultrasound showed 3.9 cm nodule within the right thyroid lobe.  He underwent a fine-needle aspiration biopsy of the mass, and the results were consistent with a follicular lesion.  The patient also complained of some compressive symptoms.  Based on the above findings, the decision was made for the patient to undergo the above- stated procedure.  The risks, benefits, alternatives, and details of the procedure were discussed with the patient.  Questions were invited and answered.  Informed consent was obtained.  DESCRIPTION:  The patient was taken to the operating room and placed supine on the operating table.  General endotracheal tube anesthesia was administered by the anesthesiologist.  Preop IV antibiotic was given. The patient was positioned and prepped and draped in a standard sterile fashion for right hemithyroidectomy surgery.  Lidocaine 1% with 1:100,000 epinephrine was infiltrated at the site of incisions.  Lower neck transverse incision was made.  The incision was carried down to the level of the platysma muscles.  Superior and inferiorly based subplatysmal flaps were elevated in a standard fashion.   The strap muscles were then divided at midline and retracted laterally, exposing the thyroid gland.  The right strap muscles was also divided transversely.  The right thyroid gland was carefully dissected free from the surrounding soft tissue.  The middle thyroid vein and the superior thyroid bundle was suture ligated.  The right recurrent laryngeal nerve was identified and preserved.  The nerve was noted to be functional throughout the case.  A large nearly 4 cm nodule was noted within the right thyroid lobe.  The entire right thyroid lobe and off of the isthmus were resected free.  It was sent to the Pathology Department for frozen section analysis.  The result was consistent with a follicular lesion.  The surgical site was copiously irrigated.  A #10 JP drain was placed.  The strap muscles were then closed with interrupted 4-0 Vicryl sutures.  The incision was then closed in layers with 4-0 Vicryl and Dermabond.  The care of the patient was turned over to the anesthesiologist.  The patient was awakened from anesthesia without difficulty.  He was extubated and transferred to the recovery room in good condition.  OPERATIVE FINDINGS:  A 4 cm right thyroid mass was noted.  The frozen section analysis was consistent with a follicular lesion.  SPECIMEN:  Right thyroid lobe.  FOLLOWUP CARE:  The patient will be observed overnight in the hospital. He will most likely be discharged home on postop day #1.     Leta Baptist, MD     ST/MEDQ  D:  02/28/2014  T:  02/28/2014  Job:  [id number]

## 2014-02-28 NOTE — Progress Notes (Signed)
   02/28/14 2002  BiPAP/CPAP/SIPAP  BiPAP/CPAP/SIPAP Pt Type Adult  Mask Type Nasal pillows  Mask Size Medium  Set Rate 0 breaths/min  Respiratory Rate 16 breaths/min  IPAP 9 cmH20  EPAP 9 cmH2O  Oxygen Percent 32 %  Flow Rate 3 lpm  BiPAP/CPAP/SIPAP CPAP  Patient Home Equipment No (Except for mask and tubing)  Auto Titrate No  BiPAP/CPAP /SiPAP Vitals  Pulse Rate 78  Resp 16

## 2014-03-01 DIAGNOSIS — D34 Benign neoplasm of thyroid gland: Secondary | ICD-10-CM | POA: Diagnosis not present

## 2014-03-01 NOTE — Discharge Summary (Signed)
Physician Discharge Summary  Patient ID: Robert [name] MRN: [id number] DOB/AGE: 36/10/1975 40 y.o.  Admit date: 02/28/2014 Discharge date: 03/01/2014  Admission Diagnoses: Right thyroid mass  Discharge Diagnoses: Right thyroid mass Active Problems:   S/P partial thyroidectomy   Discharged Condition: good  Hospital Course: Pt had an uneventful overnight stay. Pt tolerated po well. No bleeding. No stridor. Voice is strong  Consults: None  Significant Diagnostic Studies: none  Treatments: surgery: Right hemithyroidectomy  Discharge Exam: Blood pressure 122/56, pulse 80, temperature 97.7 F (36.5 C), temperature source Oral, resp. rate 18, height 6\' 2"  (1.88 m), weight 295 lb (133.811 kg), SpO2 100 %. Incision/Wound:c/d/i Voice is strong  Disposition:   Discharge Instructions    Activity as tolerated - No restrictions    Complete by:  As directed      Diet general    Complete by:  As directed             Medication List    TAKE these medications        ALPRAZolam 0.25 MG tablet  Commonly known as:  XANAX  TAKE 1 TABLET BY MOUTH THREE TIMES DAILY AS NEEDED FOR ANXIETY     clindamycin 300 MG capsule  Commonly known as:  CLEOCIN  Take 1 capsule (300 mg total) by mouth 3 (three) times daily.     lisdexamfetamine 60 MG capsule  Commonly known as:  VYVANSE  Take 1 capsule daily. Do not refill before 03/08/14.     lisdexamfetamine 60 MG capsule  Commonly known as:  VYVANSE  Take 1 capsule (60 mg total) by mouth every morning.     Lorcaserin HCl 10 MG Tabs  Commonly known as:  BELVIQ  Take 1 tablet by mouth 2 (two) times daily.     oxyCODONE-acetaminophen 5-325 MG per tablet  Commonly known as:  ROXICET  Take 1 tablet by mouth every 4 (four) hours as needed for moderate pain or severe pain.     triamcinolone 0.1 % cream : eucerin Crea  Apply 1 application topically 2 (two) times daily.           Follow-up Information    Follow up with Ascencion Dike, MD  On 03/07/2014.   Specialty:  Otolaryngology   Why:  As scheduled   Contact information:   [address] [place] [address] [phone]       Signed: Ascencion Dike 03/01/2014, 8:15 AM

## 2014-03-01 NOTE — Progress Notes (Signed)
Patient discharged to home with instructions. 

## 2014-03-01 NOTE — Progress Notes (Addendum)
duplicate

## 2014-03-02 ENCOUNTER — Encounter (HOSPITAL_COMMUNITY): Payer: Self-pay | Admitting: Otolaryngology

## 2014-03-13 ENCOUNTER — Ambulatory Visit: Payer: BC Managed Care – PPO | Admitting: Family

## 2014-05-14 ENCOUNTER — Encounter (HOSPITAL_COMMUNITY): Payer: Self-pay | Admitting: *Deleted

## 2014-05-14 ENCOUNTER — Emergency Department (HOSPITAL_COMMUNITY)
Admission: EM | Admit: 2014-05-14 | Discharge: 2014-05-14 | Disposition: A | Discharge status: Home / Self Care | Payer: BLUE CROSS/BLUE SHIELD | Attending: Emergency Medicine | Admitting: Emergency Medicine

## 2014-05-14 DIAGNOSIS — N342 Other urethritis: Secondary | ICD-10-CM | POA: Diagnosis not present

## 2014-05-14 DIAGNOSIS — N341 Nonspecific urethritis: Secondary | ICD-10-CM

## 2014-05-14 DIAGNOSIS — Z8669 Personal history of other diseases of the nervous system and sense organs: Secondary | ICD-10-CM | POA: Insufficient documentation

## 2014-05-14 DIAGNOSIS — N4889 Other specified disorders of penis: Secondary | ICD-10-CM

## 2014-05-14 DIAGNOSIS — R319 Hematuria, unspecified: Secondary | ICD-10-CM

## 2014-05-14 DIAGNOSIS — Z8709 Personal history of other diseases of the respiratory system: Secondary | ICD-10-CM | POA: Diagnosis not present

## 2014-05-14 DIAGNOSIS — Z79899 Other long term (current) drug therapy: Secondary | ICD-10-CM | POA: Diagnosis not present

## 2014-05-14 DIAGNOSIS — Z880 Allergy status to penicillin: Secondary | ICD-10-CM | POA: Diagnosis not present

## 2014-05-14 DIAGNOSIS — N481 Balanitis: Secondary | ICD-10-CM

## 2014-05-14 DIAGNOSIS — Z792 Long term (current) use of antibiotics: Secondary | ICD-10-CM | POA: Insufficient documentation

## 2014-05-14 DIAGNOSIS — F909 Attention-deficit hyperactivity disorder, unspecified type: Secondary | ICD-10-CM | POA: Diagnosis not present

## 2014-05-14 LAB — URINALYSIS, ROUTINE W REFLEX MICROSCOPIC
Bilirubin Urine: NEGATIVE
Glucose, UA: NEGATIVE mg/dL
Ketones, ur: NEGATIVE mg/dL
Leukocytes, UA: NEGATIVE
Nitrite: NEGATIVE
Protein, ur: NEGATIVE mg/dL
Specific Gravity, Urine: 1.023 (ref 1.005–1.030)
Urobilinogen, UA: 0.2 mg/dL (ref 0.0–1.0)
pH: 5.5 (ref 5.0–8.0)

## 2014-05-14 LAB — URINE MICROSCOPIC-ADD ON

## 2014-05-14 MED ORDER — AZITHROMYCIN 250 MG PO TABS
2000.0000 mg | ORAL_TABLET | Freq: Once | ORAL | Status: AC
  Administered 2014-05-14: 2000 mg via ORAL
  Filled 2014-05-14: qty 8

## 2014-05-14 MED ORDER — HYDROCODONE-ACETAMINOPHEN 5-325 MG PO TABS: 1.0000 | ORAL_TABLET | Freq: Four times a day (QID) | ORAL | Status: DC | PRN

## 2014-05-14 MED ORDER — LIDOCAINE HCL 2 % EX GEL
1.0000 "application " | Freq: Once | CUTANEOUS | Status: AC
  Administered 2014-05-14: 1 via TOPICAL
  Filled 2014-05-14: qty 20

## 2014-05-14 MED ORDER — HYDROCODONE-ACETAMINOPHEN 5-325 MG PO TABS
1.0000 | ORAL_TABLET | Freq: Once | ORAL | Status: AC
  Administered 2014-05-14: 1 via ORAL
  Filled 2014-05-14: qty 1

## 2014-05-14 MED ORDER — HYDROCORTISONE 1 % EX CREA: 1.0000 "application " | TOPICAL_CREAM | Freq: Two times a day (BID) | CUTANEOUS | Status: DC | PRN

## 2014-05-14 NOTE — ED Notes (Signed)
Pt reports he woke up this morning to urinate and in midstream he had a "light sneeze" and instantly felt a sharp pain near the base of his shaft. Pt reports hematuria and edema of the glans with a "malformation" of the urethra. Pt states he has been unable to fully empty his bladder rt pain with urination. Pt denies taking any ED medications or any blood in his ejaculate or priaprism.

## 2014-05-14 NOTE — ED Notes (Signed)
Bladder scan reveals 66 mL of urine in bladder after an output of 500 mL.

## 2014-05-14 NOTE — Discharge Instructions (Signed)
Your pain may have been caused by a passed kidney stone, or could be from balanitis (inflammation of the penis head). Be sure to clean thoroughly under the skin of the penis, and apply small amount of hydrocortisone cream to the area of swelling near the head of the penis. Use vicodin for pain, but don't drive while taking this. See the urologist for follow up in 1 week. Return to the ER for changes or worsening symptoms.   Urethritis Urethritis is an inflammation of the tube through which urine exits your bladder (urethra).  CAUSES Urethritis is often caused by an infection in your urethra. The infection can be viral, like herpes. The infection can also be bacterial, like gonorrhea. RISK FACTORS Risk factors of urethritis include:  Having sex without using a condom.  Having multiple sexual partners.  Having poor hygiene. SIGNS AND SYMPTOMS Symptoms of urethritis are less noticeable in women than in men. These symptoms include:  Burning feeling when you urinate (dysuria).  Discharge from your urethra.  Blood in your urine (hematuria).  Urinating more than usual. DIAGNOSIS  To confirm a diagnosis of urethritis, your health care provider will do the following:  Ask about your sexual history.  Perform a physical exam.  Have you provide a sample of your urine for lab testing.  Use a cotton swab to gently collect a sample from your urethra for lab testing. TREATMENT  It is important to treat urethritis. Depending on the cause, untreated urethritis may lead to serious genital infections and possibly infertility. Urethritis caused by a bacterial infection is treated with antibiotic medicine. All sexual partners must be treated.  HOME CARE INSTRUCTIONS  Do not have sex until the test results are known and treatment is completed, even if your symptoms go away before you finish treatment.  If you were prescribed an antibiotic, finish it all even if you start to feel better. SEEK  MEDICAL CARE IF:   Your symptoms are not improved in 3 days.  Your symptoms are getting worse.  You develop abdominal pain or pelvic pain (in women).  You develop joint pain.  You have a fever. SEEK IMMEDIATE MEDICAL CARE IF:   You have severe pain in the belly, back, or side.  You have repeated vomiting. MAKE SURE YOU:  Understand these instructions.  Will watch your condition.  Will get help right away if you are not doing well or get worse. Document Released: 09/09/2000 Document Revised: 07/31/2013 Document Reviewed: 11/14/2012 [facility] Patient Information 2015 [place], [place]. This information is not intended to replace advice given to you by your health care provider. Make sure you discuss any questions you have with your health care provider.  Hematuria Hematuria is blood in your urine. It can be caused by a bladder infection, kidney infection, prostate infection, kidney stone, or cancer of your urinary tract. Infections can usually be treated with medicine, and a kidney stone usually will pass through your urine. If neither of these is the cause of your hematuria, further workup to find out the reason may be needed. It is very important that you tell your health care provider about any blood you see in your urine, even if the blood stops without treatment or happens without causing pain. Blood in your urine that happens and then stops and then happens again can be a symptom of a very serious condition. Also, pain is not a symptom in the initial stages of many urinary cancers. HOME CARE INSTRUCTIONS   Drink lots of  fluid, 3-4 quarts a day. If you have been diagnosed with an infection, cranberry juice is especially recommended, in addition to large amounts of water.  Avoid caffeine, tea, and carbonated beverages because they tend to irritate the bladder.  Avoid alcohol because it may irritate the prostate.  Take all medicines as directed by your health care provider.  If  you were prescribed an antibiotic medicine, finish it all even if you start to feel better.  If you have been diagnosed with a kidney stone, follow your health care provider's instructions regarding straining your urine to catch the stone.  Empty your bladder often. Avoid holding urine for long periods of time.  After a bowel movement, women should cleanse front to back. Use each tissue only once.  Empty your bladder before and after sexual intercourse if you are a male. SEEK MEDICAL CARE IF:  You develop back pain.  You have a fever.  You have a feeling of sickness in your stomach (nausea) or vomiting.  Your symptoms are not better in 3 days. Return sooner if you are getting worse. SEEK IMMEDIATE MEDICAL CARE IF:   You develop severe vomiting and are unable to keep the medicine down.  You develop severe back or abdominal pain despite taking your medicines.  You begin passing a large amount of blood or clots in your urine.  You feel extremely weak or faint, or you pass out. MAKE SURE YOU:   Understand these instructions.  Will watch your condition.  Will get help right away if you are not doing well or get worse. Document Released: 03/16/2005 Document Revised: 07/31/2013 Document Reviewed: 11/14/2012 [facility] Patient Information 2015 [place], [place]. This information is not intended to replace advice given to you by your health care provider. Make sure you discuss any questions you have with your health care provider.  Balanitis Balanitis is inflammation of the head of the penis (glans).  CAUSES  Balanitis has multiple causes, both infectious and noninfectious. Often balanitis is the result of poor personal hygiene, especially in uncircumcised males. Without adequate washing, viruses, bacteria, and yeast collect between the foreskin and the glans. This can cause an infection. Lack of air and irritation from a normal secretion called smegma contribute to the cause in  uncircumcised males. Other causes include:  Chemical irritation from the use of certain soaps and shower gels (especially soaps with perfumes), condoms, personal lubricants, petroleum jelly, spermicides, and fabric conditioners.  Skin conditions, such as eczema, dermatitis, and psoriasis.  Allergies to drugs, such as tetracycline and sulfa.  Certain medical conditions, including liver cirrhosis, congestive heart failure, and kidney disease.  Morbid obesity. RISK FACTORS  Diabetes mellitus.  A tight foreskin that is difficult to pull back past the glans (phimosis).  Sex without the use of a condom. SIGNS AND SYMPTOMS  Symptoms may include:  Discharge coming from under the foreskin.  Tenderness.  Itching and inability to get an erection (because of the pain).  Redness and a rash.  Sores on the glans and on the foreskin. DIAGNOSIS Diagnosis of balanitis is confirmed through a physical exam. TREATMENT The treatment is based on the cause of the balanitis. Treatment may include:  Frequent cleansing.  Keeping the glans and foreskin dry.  Use of medicines such as creams, pain medicines, antibiotics, or medicines to treat fungal infections.  Sitz baths. If the irritation has caused a scar on the foreskin that prevents easy retraction, a circumcision may be recommended.  HOME CARE INSTRUCTIONS  Sex should  be avoided until the condition has cleared. MAKE SURE YOU:  Understand these instructions.  Will watch your condition.  Will get help right away if you are not doing well or get worse. Document Released: 08/02/2008 Document Revised: 03/21/2013 Document Reviewed: 09/05/2012 [facility] Patient Information 2015 [place], [place]. This information is not intended to replace advice given to you by your health care provider. Make sure you discuss any questions you have with your health care provider.

## 2014-05-14 NOTE — ED Notes (Signed)
Pt is stable upon d/c and ambulates independently from ED with family. Pt verbalizes understanding rt d/c instructions and medications.

## 2014-05-14 NOTE — Progress Notes (Signed)
Patient paged me this evening.  He is not a patient of mine but was given our office number for follow-up.  He reports increased blood per urethra and some pain with voiding but he is not having trouble urinating. He describes his symptoms are similar to when he was at the emergency department but an increase in the blood this evening which concerned him. He is taking Vicodin and his pain is controlled.  He otherwise feels well.  No fevers.  I told him if he had any trouble voiding, uncontrolled pain, fevers chills nausea or vomiting to go back to the emergency department tonight.Otherwise I told him to call the office in the morning for an appointment with the on-call doctor and I will send a note to our triage department. We are already closed for the day due to the weather.

## 2014-05-14 NOTE — ED Provider Notes (Signed)
CSN: [id number]     Arrival date & time 05/14/14  3086 History   First MD Initiated Contact with Patient 05/14/14 [phone]     Chief Complaint  Patient presents with  . Hematuria  . Groin Swelling     (Consider location/radiation/quality/duration/timing/severity/associated sxs/prior Treatment) HPI Comments: KALIJAH ZEISS is a 39 y.o. male with a PMHx of chronic sinusitis, chronic headaches, ADD, and sleep apnea, who presents to the ED with complaints of pain at the head of his penis that began approximately 1 hour prior to arrival while he was urinating and sneezed in  "midstream". Patient states the pain is 9/10 sharp burning pain near the urethra, nonradiating, constant, worse with urination and pressure to the area, and with no known alleviating factors given that he has not tried anything prior to arrival. Patient endorses associated dysuria, hematuria, swelling at the glans of his penis and the urethra, difficulty urinating due to pain, suprapubic pressure pain, and dribbling with urination. He denies any penile discharge, testicular pain or swelling, rectal pain, fevers, chills, chest pain, shortness of breath, nausea, vomiting, diarrhea, constipation, melena, hematochezia, numbness, tingling, or weakness. Denies any other rashes, or decreased stream of urination. Denies any new changes in soaps or detergents. Sexually active with one partner unprotected, last intercourse was 2 days ago. No hx of kidney stones, but father had stones.  Patient is a 39 y.o. male presenting with hematuria. The history is provided by the patient. No language interpreter was used.  Hematuria This is a new problem. The current episode started today. The problem occurs constantly. The problem has been unchanged. Associated symptoms include abdominal pain (suprapubic pressure) and urinary symptoms. Pertinent negatives include no arthralgias, chest pain, chills, fever, myalgias, nausea, numbness, rash, vomiting or  weakness. Exacerbated by: urination. He has tried nothing for the symptoms. The treatment provided no relief.    Past Medical History  Diagnosis Date  . Chronic sinusitis   . ADD (attention deficit disorder) without hyperactivity     according to his mother/ work test possible  . Sleep apnea   . Headache    Past Surgical History  Procedure Laterality Date  . Nasal sinus surgery    . Hernia repair      pt. was an infant when this surgery occured  . Thyroid surgery Right 02/28/2014    DR TEOH    PARTIAL THYROIDECTOMY  . Thyroidectomy Right 02/28/2014    Procedure: RIGHT HEMI THYROIDECTOMY;  Surgeon: Ascencion Dike, MD;  Location: [facility] OR;  Service: ENT;  Laterality: Right;   Family History  Problem Relation Age of Onset  . Hyperlipidemia    . Hypertension Mother   . Thyroid disease Neg Hx    History  Substance Use Topics  . Smoking status: Never Smoker   . Smokeless tobacco: Never Used  . Alcohol Use: Yes     Comment: social    Review of Systems  Constitutional: Negative for fever and chills.  Respiratory: Negative for shortness of breath.   Cardiovascular: Negative for chest pain.  Gastrointestinal: Positive for abdominal pain (suprapubic pressure). Negative for nausea, vomiting, diarrhea, constipation, blood in stool and rectal pain.  Genitourinary: Positive for dysuria, hematuria, penile swelling, difficulty urinating and penile pain. Negative for frequency, flank pain, discharge, scrotal swelling, genital sores and testicular pain.  Musculoskeletal: Negative for myalgias, back pain and arthralgias.  Skin: Negative for rash.  Allergic/Immunologic: Negative for immunocompromised state.  Neurological: Negative for weakness and numbness.   10 Systems  reviewed and are negative for acute change except as noted in the HPI.    Allergies  Penicillins; Milk-related compounds; and Topamax  Home Medications   Prior to Admission medications   Medication Sig Start Date End Date  Taking? Authorizing Provider  ALPRAZolam Duanne Moron) 0.25 MG tablet TAKE 1 TABLET BY MOUTH THREE TIMES DAILY AS NEEDED FOR ANXIETY 09/11/13   Timoteo Gaul, FNP  clindamycin (CLEOCIN) 300 MG capsule Take 1 capsule (300 mg total) by mouth 3 (three) times daily. 02/28/14   Ascencion Dike, MD  lisdexamfetamine (VYVANSE) 60 MG capsule Take 1 capsule daily. Do not refill before 03/08/14. 02/06/14   Marin Olp, MD  lisdexamfetamine (VYVANSE) 60 MG capsule Take 1 capsule (60 mg total) by mouth every morning. Patient not taking: Reported on 02/14/2014 02/13/14   Marin Olp, MD  Lorcaserin HCl (BELVIQ) 10 MG TABS Take 1 tablet by mouth 2 (two) times daily. 09/11/13   Timoteo Gaul, FNP  oxyCODONE-acetaminophen (ROXICET) 5-325 MG per tablet Take 1 tablet by mouth every 4 (four) hours as needed for moderate pain or severe pain. 02/28/14   Ascencion Dike, MD  Triamcinolone Acetonide (TRIAMCINOLONE 0.1 % CREAM : EUCERIN) CREA Apply 1 application topically 2 (two) times daily. Patient taking differently: Apply 1 application topically 2 (two) times daily as needed for rash.  10/25/13   Timoteo Gaul, FNP   BP 99/77 mmHg  Pulse 67  Temp(Src) 98.5 F (36.9 C) (Oral)  Resp 18  SpO2 96% Physical Exam  Constitutional: He is oriented to person, place, and time. Vital signs are normal. He appears well-developed and well-nourished.  Non-toxic appearance. No distress.  Afebrile nontoxic NAD  HENT:  Head: Normocephalic and atraumatic.  Mouth/Throat: Mucous membranes are normal.  Eyes: Conjunctivae and EOM are normal. Right eye exhibits no discharge. Left eye exhibits no discharge.  Neck: Normal range of motion. Neck supple.  Cardiovascular: Normal rate, regular rhythm, normal heart sounds and intact distal pulses.  Exam reveals no gallop and no friction rub.   No murmur heard. Pulmonary/Chest: Effort normal and breath sounds normal. No respiratory distress. He has no decreased breath sounds. He has no  wheezes. He has no rhonchi. He has no rales.  Abdominal: Soft. Normal appearance and bowel sounds are normal. He exhibits no distension. There is tenderness in the suprapubic area. There is no rigidity, no rebound, no guarding, no CVA tenderness, no tenderness at McBurney's point and negative Murphy's sign. Hernia confirmed negative in the right inguinal area and confirmed negative in the left inguinal area.    Soft, obese but nondistended, +BS throughout, with mild suprapubic TTP, no r/g/r, neg murphy's, neg mcburney's, no CVA TTP   Genitourinary: Rectum normal, prostate normal and testes normal. Cremasteric reflex is present. Right testis shows no mass, no swelling and no tenderness. Left testis shows no mass, no swelling and no tenderness. Circumcised. Penile erythema and penile tenderness present. No phimosis, paraphimosis or hypospadias. No discharge found.  Chaperone present for exam No gross blood noted on rectal exam, normal tone, no tenderness, no mass or fissure, no hemorrhoids. Prostate without enlargement or tenderness, no warmth. Circumcised penis without phimosis/paraphimosis, hypospadias, or discharge. Mildly edematous glans and urethral erythema with TTP at the glans and urethra. Testes with no masses or tenderness, no swelling, and cremasterics reflex present bilaterally. No inguinal hernias or adenopathy present.   Musculoskeletal: Normal range of motion.  Neurological: He is alert and oriented to person, place, and time.  He has normal strength. No sensory deficit.  Skin: Skin is warm, dry and intact. No rash noted.  Psychiatric: He has a normal mood and affect.  Nursing note and vitals reviewed.   ED Course  Procedures (including critical care time)  Bladder scan: voided 581mL prior to exam Post-void residual: 53mL   Labs Review Labs Reviewed  URINALYSIS, ROUTINE W REFLEX MICROSCOPIC - Abnormal; Notable for the following:    Hgb urine dipstick TRACE (*)    All other  components within normal limits  URINE CULTURE  URINE MICROSCOPIC-ADD ON  GC/CHLAMYDIA PROBE AMP ([place])    Imaging Review No results found.   EKG Interpretation None      MDM   Final diagnoses:  Penile pain  Balanitis  Hematuria  Urethritis, nonspecific    39 y.o. male with hematuria, dysuria, and swelling at the glans somewhat consistent with balanitis but could be urethritis vs passed stone this morning. Prostate exam benign, doubt prostatitis or BPH. Will check U/A, Ucx, and GC/CT, and likely treat for GC/CT empirically. Will check bladder scan. Doubt need for other labs or imaging. Will reassess shortly.   10:09 AM Able to void 500cc, post-void residual 66cc on bladder scan. U/A with trace Hgb, 0-2 WBC, and rare bacteria. Does not seem to be a UTI. Likely urethritis from possibly passed kidney stone, vs balanitis. Empirically treated here with 2g Azithro (has anaphylaxis to PCN). Given lidocaine jelly and vicodin for pain. Will send home with vicodin and hydrocortisone cream, with follow up with urology. I explained the diagnosis and have given explicit precautions to return to the ER including for any other new or worsening symptoms. The patient understands and accepts the medical plan as it's been dictated and I have answered their questions. Discharge instructions concerning home care and prescriptions have been given. The patient is STABLE and is discharged to home in good condition.  BP 125/76 mmHg  Pulse 62  Temp(Src) 98.5 F (36.9 C) (Oral)  Resp 18  SpO2 92%  Meds ordered this encounter  Medications  . azithromycin (ZITHROMAX) tablet 2,000 mg    Sig:   . HYDROcodone-acetaminophen (NORCO/VICODIN) 5-325 MG per tablet 1 tablet    Sig:   . lidocaine (XYLOCAINE) 2 % jelly 1 application    Sig:   . HYDROcodone-acetaminophen (NORCO/VICODIN) 5-325 MG per tablet    Sig: Take 1 tablet by mouth every 6 (six) hours as needed for moderate pain or severe pain.     Dispense:  6 tablet    Refill:  0    Order Specific Question:  Supervising Provider    Answer:  Noemi Chapel D [[id number]]  . hydrocortisone cream 1 %    Sig: Apply 1 application topically 2 (two) times daily as needed (pain at head of shaft). Apply to the head of the penis where the pain is located twice daily as needed for pain or swelling    Dispense:  15 g    Refill:  0    Order Specific Question:  Supervising Provider    Answer:  Johnna Acosta [place], PA-C 05/14/14 1019  Mariea Clonts, MD 05/23/14 1729

## 2014-05-15 LAB — GC/CHLAMYDIA PROBE AMP (~~LOC~~) NOT AT ARMC
Chlamydia: NEGATIVE
Neisseria Gonorrhea: NEGATIVE

## 2014-05-15 LAB — URINE CULTURE
Colony Count: NO GROWTH
Culture: NO GROWTH
Special Requests: NORMAL

## 2014-05-22 ENCOUNTER — Emergency Department (HOSPITAL_COMMUNITY)
Admission: EM | Admit: 2014-05-22 | Discharge: 2014-05-22 | Disposition: A | Discharge status: Home / Self Care | Payer: BLUE CROSS/BLUE SHIELD | Attending: Emergency Medicine | Admitting: Emergency Medicine

## 2014-05-22 ENCOUNTER — Encounter (HOSPITAL_COMMUNITY): Payer: Self-pay

## 2014-05-22 DIAGNOSIS — Z880 Allergy status to penicillin: Secondary | ICD-10-CM | POA: Diagnosis not present

## 2014-05-22 DIAGNOSIS — R109 Unspecified abdominal pain: Secondary | ICD-10-CM | POA: Diagnosis present

## 2014-05-22 DIAGNOSIS — E669 Obesity, unspecified: Secondary | ICD-10-CM | POA: Diagnosis not present

## 2014-05-22 DIAGNOSIS — Z791 Long term (current) use of non-steroidal anti-inflammatories (NSAID): Secondary | ICD-10-CM | POA: Insufficient documentation

## 2014-05-22 DIAGNOSIS — Z8669 Personal history of other diseases of the nervous system and sense organs: Secondary | ICD-10-CM | POA: Insufficient documentation

## 2014-05-22 DIAGNOSIS — Z8709 Personal history of other diseases of the respiratory system: Secondary | ICD-10-CM | POA: Insufficient documentation

## 2014-05-22 DIAGNOSIS — R319 Hematuria, unspecified: Secondary | ICD-10-CM | POA: Insufficient documentation

## 2014-05-22 DIAGNOSIS — Z87442 Personal history of urinary calculi: Secondary | ICD-10-CM | POA: Insufficient documentation

## 2014-05-22 DIAGNOSIS — F909 Attention-deficit hyperactivity disorder, unspecified type: Secondary | ICD-10-CM | POA: Diagnosis not present

## 2014-05-22 DIAGNOSIS — Z79899 Other long term (current) drug therapy: Secondary | ICD-10-CM | POA: Insufficient documentation

## 2014-05-22 DIAGNOSIS — F419 Anxiety disorder, unspecified: Secondary | ICD-10-CM | POA: Insufficient documentation

## 2014-05-22 HISTORY — DX: Calculus of kidney: N20.0

## 2014-05-22 LAB — URINALYSIS, ROUTINE W REFLEX MICROSCOPIC
Bilirubin Urine: NEGATIVE
Glucose, UA: NEGATIVE mg/dL
Ketones, ur: NEGATIVE mg/dL
Leukocytes, UA: NEGATIVE
Nitrite: NEGATIVE
Protein, ur: NEGATIVE mg/dL
Specific Gravity, Urine: 1.04 — ABNORMAL HIGH (ref 1.005–1.030)
Urobilinogen, UA: 0.2 mg/dL (ref 0.0–1.0)
pH: 6 (ref 5.0–8.0)

## 2014-05-22 LAB — URINE MICROSCOPIC-ADD ON

## 2014-05-22 MED ORDER — HYDROCODONE-ACETAMINOPHEN 5-325 MG PO TABS: 1.0000 | ORAL_TABLET | Freq: Four times a day (QID) | ORAL | Status: DC | PRN

## 2014-05-22 MED ORDER — KETOROLAC TROMETHAMINE 60 MG/2ML IM SOLN
60.0000 mg | Freq: Once | INTRAMUSCULAR | Status: AC
  Administered 2014-05-22: 60 mg via INTRAMUSCULAR
  Filled 2014-05-22: qty 2

## 2014-05-22 NOTE — Discharge Instructions (Signed)
If you were given medicines take as directed.  If you are on coumadin or contraceptives realize their levels and effectiveness is altered by many different medicines.  If you have any reaction (rash, tongues swelling, other) to the medicines stop taking and see a physician.   Take your naproxen or ibuprofen every 6 hrs for pain, For severe pain take norco or vicodin however realize they have the potential for addiction and it can make you sleepy and has tylenol in it.  No operating machinery while taking.  Please follow up as directed and return to the ER or see a physician for new or worsening symptoms.  Thank you. Filed Vitals:   05/22/14 1231 05/22/14 1510  BP: 138/73 126/69  Pulse: 87 67  Temp: 97.4 F (36.3 C) 97.4 F (36.3 C)  TempSrc: Oral Oral  Resp: 16 17  SpO2: 100% 98%

## 2014-05-22 NOTE — ED Notes (Signed)
   Medical screening examination/treatment/procedure(s) were conducted as a shared visit wwith non-physician practitioner(s) or resident and myself. I personally evaluated the patient during the encounter and agree with the findings.  I have personally reviewed any xrays and/ or EKG's with the provider and I agree with interpretation.  Patient presents with acute onset dysuria, hematuria worsening of the tip of the penis. Patient is sexually active without protection one partner. Patient has mild swelling to the tip of the urethra meatus, no testicle or shaft swelling, patient well-appearing otherwise. Possible causes include urine infection, kidney stone passage, STD. Discussed antibiotics and follow-up with urology.  Dysuria, hematuria     Mariea Clonts, MD 05/22/14 [phone]

## 2014-05-22 NOTE — ED Notes (Signed)
Pt c/o R flank pain and hematuria starting this morning.  Pain score 8/10.  Pt was seen for kidney stones x 7 days ago.  Pt has followed up w/ Urology and told that they did not see any further stones.

## 2014-05-22 NOTE — ED Provider Notes (Signed)
CSN: [id number]     Arrival date & time 05/22/14  1205 History   First MD Initiated Contact with Patient 05/22/14 1456     Chief Complaint  Patient presents with  . Flank Pain     (Consider location/radiation/quality/duration/timing/severity/associated sxs/prior Treatment) HPI Comments: 39 year old male with history of anxiety, obesity, kidney stone, recent visit to urology with CT scan that was negative for anything acute per patient presents with recurrent right flank pain since this morning. Patient's had intermittent episodes, feels like prior to when he passes loss kidney stone. No specific radiation, no fevers chills or vomiting. Patient has naproxen at home however has not taken it. No specific timing. Started initially after sneezing and coughing this morning.  Patient is a 39 y.o. male presenting with flank pain. The history is provided by the patient.  Flank Pain Pertinent negatives include no chest pain, no abdominal pain, no headaches and no shortness of breath.    Past Medical History  Diagnosis Date  . Chronic sinusitis   . ADD (attention deficit disorder) without hyperactivity     according to his mother/ work test possible  . Sleep apnea   . Headache   . Kidney stones    Past Surgical History  Procedure Laterality Date  . Nasal sinus surgery    . Hernia repair      pt. was an infant when this surgery occured  . Thyroid surgery Right 02/28/2014    DR TEOH    PARTIAL THYROIDECTOMY  . Thyroidectomy Right 02/28/2014    Procedure: RIGHT HEMI THYROIDECTOMY;  Surgeon: Ascencion Dike, MD;  Location: [facility] OR;  Service: ENT;  Laterality: Right;   Family History  Problem Relation Age of Onset  . Hyperlipidemia    . Hypertension Mother   . Thyroid disease Neg Hx    History  Substance Use Topics  . Smoking status: Never Smoker   . Smokeless tobacco: Never Used  . Alcohol Use: Yes     Comment: social    Review of Systems  Constitutional: Negative for fever and chills.   HENT: Negative for congestion.   Eyes: Negative for visual disturbance.  Respiratory: Negative for shortness of breath.   Cardiovascular: Negative for chest pain.  Gastrointestinal: Negative for vomiting and abdominal pain.  Genitourinary: Positive for flank pain. Negative for dysuria.  Musculoskeletal: Negative for back pain, neck pain and neck stiffness.  Skin: Negative for rash.  Neurological: Negative for light-headedness and headaches.      Allergies  Penicillins; Milk-related compounds; and Topamax  Home Medications   Prior to Admission medications   Medication Sig Start Date End Date Taking? Authorizing Provider  ALPRAZolam Duanne Moron) 0.25 MG tablet TAKE 1 TABLET BY MOUTH THREE TIMES DAILY AS NEEDED FOR ANXIETY 09/11/13  Yes Timoteo Gaul, FNP  cyclobenzaprine (FLEXERIL) 10 MG tablet Take 10 mg by mouth 3 (three) times daily as needed for muscle spasms.   Yes Historical Provider, MD  HYDROcodone-acetaminophen (NORCO/VICODIN) 5-325 MG per tablet Take 1 tablet by mouth every 6 (six) hours as needed for moderate pain or severe pain. 05/14/14  Yes Mercedes Strupp Camprubi-Soms, PA-C  hydrocortisone cream 1 % Apply 1 application topically 2 (two) times daily as needed (pain at head of shaft). Apply to the head of the penis where the pain is located twice daily as needed for pain or swelling 05/14/14  Yes Mercedes Strupp Camprubi-Soms, PA-C  lisdexamfetamine (VYVANSE) 60 MG capsule Take 1 capsule (60 mg total) by mouth  every morning. 02/13/14  Yes Marin Olp, MD  Meth-Hyo-M Barnett Hatter Phos-Ph Sal (UTIRA-C) 81.6 MG TABS Take 1 tablet by mouth every 6 (six) hours.    Yes Historical Provider, MD  naproxen (NAPROSYN) 500 MG tablet Take 500 mg by mouth 2 (two) times daily with a meal.   Yes Historical Provider, MD  Triamcinolone Acetonide (TRIAMCINOLONE 0.1 % CREAM : EUCERIN) CREA Apply 1 application topically 2 (two) times daily. Patient taking differently: Apply 1 application topically 2  (two) times daily as needed for rash.  10/25/13  Yes Timoteo Gaul, FNP  clindamycin (CLEOCIN) 300 MG capsule Take 1 capsule (300 mg total) by mouth 3 (three) times daily. Patient not taking: Reported on 05/14/2014 02/28/14   Ascencion Dike, MD  HYDROcodone-acetaminophen [facility]) 5-325 MG per tablet Take 1-2 tablets by mouth every 6 (six) hours as needed for severe pain. 05/22/14   Mariea Clonts, MD  Lorcaserin HCl (BELVIQ) 10 MG TABS Take 1 tablet by mouth 2 (two) times daily. Patient not taking: Reported on 05/14/2014 09/11/13   Timoteo Gaul, FNP  oxyCODONE-acetaminophen (ROXICET) 5-325 MG per tablet Take 1 tablet by mouth every 4 (four) hours as needed for moderate pain or severe pain. Patient not taking: Reported on 05/14/2014 02/28/14   Ascencion Dike, MD   BP 126/69 mmHg  Pulse 67  Temp(Src) 97.4 F (36.3 C) (Oral)  Resp 17  SpO2 98% Physical Exam  Constitutional: He is oriented to person, place, and time. He appears well-developed and well-nourished.  HENT:  Head: Normocephalic and atraumatic.  Eyes: Conjunctivae are normal. Right eye exhibits no discharge. Left eye exhibits no discharge.  Neck: Normal range of motion. Neck supple. No tracheal deviation present.  Cardiovascular: Normal rate and regular rhythm.   Pulmonary/Chest: Effort normal and breath sounds normal.  Abdominal: Soft. He exhibits no distension. There is tenderness (mild right flank posterior). There is no guarding.  Musculoskeletal: He exhibits no edema.  Neurological: He is alert and oriented to person, place, and time.  Skin: Skin is warm. No rash noted.  Psychiatric: He has a normal mood and affect.  Nursing note and vitals reviewed.   ED Course  Procedures (including critical care time) Emergency Focused Ultrasound Exam Limited retroperitoneal ultrasound of kidneys  Performed and interpreted by Dr. Reather Converse Indication: flank pain Focused abdominal ultrasound with both kidneys imaged in transverse and  longitudinal planes in real-time Interpretation:No hydronephrosis visualized.   Images archived electronically  Labs Review Labs Reviewed  URINALYSIS, ROUTINE W REFLEX MICROSCOPIC - Abnormal; Notable for the following:    Color, Urine GREEN (*)    APPearance CLOUDY (*)    Specific Gravity, Urine 1.040 (*)    Hgb urine dipstick TRACE (*)    All other components within normal limits  URINE MICROSCOPIC-ADD ON - Abnormal; Notable for the following:    Bacteria, UA FEW (*)    Casts HYALINE CASTS (*)    All other components within normal limits    Imaging Review No results found.   EKG Interpretation None      MDM   Final diagnoses:  Acute right flank pain  Hematuria   Patient presents with clinical concern for musculoskeletal versus kidney stone. Patient has reproducible right flank pain, inferior to the ribs. Patient had CT scan recently and well-appearing, do not feel radiation risk will change management this time. Bedside ultrasound no significant hilar. Discussed follow-up outpatient with urology. Results and differential diagnosis were discussed with the patient/parent/guardian.  Close follow up outpatient was discussed, comfortable with the plan.   Medications  ketorolac (TORADOL) injection 60 mg (60 mg Intramuscular Given 05/22/14 1539)    Filed Vitals:   05/22/14 1231 05/22/14 1510  BP: 138/73 126/69  Pulse: 87 67  Temp: 97.4 F (36.3 C) 97.4 F (36.3 C)  TempSrc: Oral Oral  Resp: 16 17  SpO2: 100% 98%    Final diagnoses:  Acute right flank pain  Hematuria        Mariea Clonts, MD 05/22/14 1554

## 2014-06-19 ENCOUNTER — Encounter: Payer: Self-pay | Admitting: Family Medicine

## 2014-06-19 ENCOUNTER — Ambulatory Visit (INDEPENDENT_AMBULATORY_CARE_PROVIDER_SITE_OTHER): Payer: BLUE CROSS/BLUE SHIELD | Admitting: Family Medicine

## 2014-06-19 VITALS — BP 110/78 | HR 87 | Temp 98.3°F | Wt 302.0 lb

## 2014-06-19 DIAGNOSIS — Z9889 Other specified postprocedural states: Secondary | ICD-10-CM

## 2014-06-19 DIAGNOSIS — F900 Attention-deficit hyperactivity disorder, predominantly inattentive type: Secondary | ICD-10-CM

## 2014-06-19 DIAGNOSIS — N200 Calculus of kidney: Secondary | ICD-10-CM | POA: Insufficient documentation

## 2014-06-19 DIAGNOSIS — Z9009 Acquired absence of other part of head and neck: Secondary | ICD-10-CM

## 2014-06-19 DIAGNOSIS — E890 Postprocedural hypothyroidism: Secondary | ICD-10-CM

## 2014-06-19 LAB — TSH: TSH: 4.85 u[IU]/mL — ABNORMAL HIGH (ref 0.35–4.50)

## 2014-06-19 MED ORDER — AMPHETAMINE-DEXTROAMPHET ER 20 MG PO CP24: 20.0000 mg | ORAL_CAPSULE | Freq: Every day | ORAL | Status: DC

## 2014-06-19 NOTE — Patient Instructions (Addendum)
ADHD  Need eval by Dr. Tish Frederickson call to schedule  Change back to adderall XL  Take 1 pill for 2 weeks  If not controlled, increase to 2 pills  See me in about 4-5 weeks  Check TSH today after partial thyroidectomy  I will try to look into the Desoxyn

## 2014-06-19 NOTE — Progress Notes (Signed)
Robert Reddish, MD Phone: [phone]  Subjective:  Patient presents today to establish care with me as their new primary care provider. Patient was formerly a patient of Dr. Arnoldo Morale. Chief complaint-noted.   ADHD- poor control -waning effects last time. Worse and worse since that time. Difficulty remaining focused. At work if new information will come in like an e-mail, switches to new task and cannot remain focused. Can't remember to pay bills at home. Late everwhere he goes. When he gets a Public house manager, he will open it up and then will sit on counter. Stressors include getting married in 4 months.   ROS- no palpitations, chest pain, no unintentional weight loss (in fact stopped belviq as no weight loss  The following were reviewed and entered/updated in epic: Past Medical History  Diagnosis Date  . Chronic sinusitis   . ADD (attention deficit disorder) without hyperactivity     according to his mother/ work test possible  . Sleep apnea   . Headache   . Kidney stones    Patient Active Problem List   Diagnosis Date Noted  . Anxiety state 01/11/2014    Priority: Medium  . Morbid obesity 01/11/2014    Priority: Medium  . ADHD (attention deficit hyperactivity disorder) 06/02/2011    Priority: Medium  . S/P partial thyroidectomy 02/28/2014    Priority: Low  . Cyst of mandible 12/22/2013    Priority: Low  . Kidney stones    Past Surgical History  Procedure Laterality Date  . Nasal sinus surgery    . Hernia repair      pt. was an infant when this surgery occured  . Thyroid surgery Right 02/28/2014    DR TEOH    PARTIAL THYROIDECTOMY  . Thyroidectomy Right 02/28/2014    Procedure: RIGHT HEMI THYROIDECTOMY;  Surgeon: Ascencion Dike, MD;  Location: [facility] OR;  Service: ENT;  Laterality: Right;    Family History  Problem Relation Age of Onset  . Hyperlipidemia Mother   . Hypertension Mother   . Thyroid disease Neg Hx     Medications- reviewed and updated Current Outpatient  Prescriptions  Medication Sig Dispense Refill  . ALPRAZolam (XANAX) 0.25 MG tablet TAKE 1 TABLET BY MOUTH THREE TIMES DAILY AS NEEDED FOR ANXIETY (Patient not taking: Reported on 06/19/2014) 60 tablet 0  . amphetamine-dextroamphetamine (ADDERALL XR) 20 MG 24 hr capsule Take 1-2 capsules (20-40 mg total) by mouth daily. 60 capsule 0  . Triamcinolone Acetonide (TRIAMCINOLONE 0.1 % CREAM : EUCERIN) CREA Apply 1 application topically 2 (two) times daily. (Patient not taking: Reported on 06/19/2014) 60 each 0   No current facility-administered medications for this visit.    Allergies-reviewed and updated Allergies  Allergen Reactions  . Penicillins Anaphylaxis  . Milk-Related Compounds Other (See Comments)    GI issues   . Topamax Other (See Comments)    Is allergic when taking while drinking alcohol    History   Social History  . Marital Status: Single    Spouse Name: N/A  . Number of Children: N/A  . Years of Education: N/A   Social History Main Topics  . Smoking status: Never Smoker   . Smokeless tobacco: Never Used  . Alcohol Use: Yes     Comment: social  . Drug Use: No  . Sexual Activity: Yes   Other Topics Concern  . None   Social History Narrative   Single but with live in girlfriend. No kids.       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    ROS--See HPI   Objective: BP 110/78 mmHg  Pulse 87  Temp(Src) 98.3 F (36.8 C)  Wt 302 lb (136.986 kg) Gen: NAD, resting comfortably Neck: no thyromegaly, scar near thyroid gland from recent surgery CV: RRR no murmurs rubs or gallops Lungs: CTAB no crackles, wheeze, rhonchi Abdomen: soft/nontender/nondistended/normal bowel sounds. Obese, Ext: no edema Skin: warm, dry, no rash   Assessment/Plan:  ADHD (attention deficit hyperactivity disorder) I am concerned given failure of adderall XR and vyvanse as well as adderall. Patient requesting Desoxyn which per uptodate does not have adult ADD labeling and I have  not used in past- discussed I would need to evaluate this method before starting. I am requesting patient get adult ADD eval by Dr. Glennon Hamilton. Switching to Adderall XR again at 20mg  to start and 40mg  after 2 weeks. Stop vyvanse. Follow up 4 weeks. Check TSH   previously diagnosed by Dr. Arnoldo Morale in office.   Orders Placed This Encounter  Procedures  . TSH    [place]    Meds ordered this encounter  Medications  . amphetamine-dextroamphetamine (ADDERALL XR) 20 MG 24 hr capsule    Sig: Take 1-2 capsules (20-40 mg total) by mouth daily.    Dispense:  60 capsule    Refill:  0

## 2014-06-19 NOTE — Assessment & Plan Note (Signed)
I am concerned given failure of adderall XR and vyvanse as well as adderall. Patient requesting Desoxyn which per uptodate does not have adult ADD labeling and I have not used in past- discussed I would need to evaluate this method before starting. I am requesting patient get adult ADD eval by Dr. Glennon Hamilton. Switching to Adderall XR again at 20mg  to start and 40mg  after 2 weeks. Stop vyvanse. Follow up 4 weeks. Check TSH

## 2014-06-20 ENCOUNTER — Encounter: Payer: Self-pay | Admitting: Endocrinology

## 2014-06-20 ENCOUNTER — Ambulatory Visit (INDEPENDENT_AMBULATORY_CARE_PROVIDER_SITE_OTHER): Payer: BLUE CROSS/BLUE SHIELD | Admitting: Endocrinology

## 2014-06-20 VITALS — BP 130/86 | HR 84 | Temp 98.4°F | Ht 74.0 in | Wt 303.0 lb

## 2014-06-20 DIAGNOSIS — E890 Postprocedural hypothyroidism: Secondary | ICD-10-CM | POA: Diagnosis not present

## 2014-06-20 DIAGNOSIS — N200 Calculus of kidney: Secondary | ICD-10-CM

## 2014-06-20 MED ORDER — LEVOTHYROXINE SODIUM 50 MCG PO TABS: 50.0000 ug | ORAL_TABLET | Freq: Every day | ORAL | Status: DC

## 2014-06-20 NOTE — Patient Instructions (Addendum)
i have sent a prescription to your pharmacy, for a thyroid pill.  Please redo the blood tests in 1 month.  We'll let you know about the results.

## 2014-06-20 NOTE — Progress Notes (Signed)
Subjective:    Patient ID: Robert [name], male    DOB: Jan 21, 1976, 39 y.o.   MRN: [id number]  HPI In 2015, pt had right thyroid lobectomy for a suspicious nodule.  pt states he feels well in general, except he recently had his first-ever episode of urolithiasis.   Past Medical History  Diagnosis Date  . Chronic sinusitis   . ADD (attention deficit disorder) without hyperactivity     according to his mother/ work test possible  . Sleep apnea   . Headache   . Kidney stones     Past Surgical History  Procedure Laterality Date  . Nasal sinus surgery    . Hernia repair      pt. was an infant when this surgery occured  . Thyroid surgery Right 02/28/2014    DR TEOH    PARTIAL THYROIDECTOMY  . Thyroidectomy Right 02/28/2014    Procedure: RIGHT HEMI THYROIDECTOMY;  Surgeon: Ascencion Dike, MD;  Location: [facility];  Service: ENT;  Laterality: Right;    History   Social History  . Marital Status: Single    Spouse Name: N/A  . Number of Children: N/A  . Years of Education: N/A   Occupational History  . Not on file.   Social History Main Topics  . Smoking status: Never Smoker   . Smokeless tobacco: Never Used  . Alcohol Use: Yes     Comment: social  . Drug Use: No  . Sexual Activity: Yes   Other Topics Concern  . Not on file   Social History Narrative   Single but with live in girlfriend. No kids.       Works time [employer] of 1 pets dog and cats.    Current Outpatient Prescriptions on File Prior to Visit  Medication Sig Dispense Refill  . ALPRAZolam (XANAX) 0.25 MG tablet TAKE 1 TABLET BY MOUTH THREE TIMES DAILY AS NEEDED FOR ANXIETY 60 tablet 0  . amphetamine-dextroamphetamine (ADDERALL XR) 20 MG 24 hr capsule Take 1-2 capsules (20-40 mg total) by mouth daily. 60 capsule 0  . Triamcinolone Acetonide (TRIAMCINOLONE 0.1 % CREAM : EUCERIN) CREA Apply 1 application topically 2 (two) times daily. 60 each 0   No current facility-administered medications on file prior to  visit.    Allergies  Allergen Reactions  . Penicillins Anaphylaxis  . Milk-Related Compounds Other (See Comments)    GI issues   . Topamax Other (See Comments)    Is allergic when taking while drinking alcohol    Family History  Problem Relation Age of Onset  . Hyperlipidemia Mother   . Hypertension Mother   . Thyroid disease Neg Hx     BP 130/86 mmHg  Pulse 84  Temp(Src) 98.4 F (36.9 C) (Oral)  Ht 6\' 2"  (1.88 m)  Wt 303 lb (137.44 kg)  BMI 38.89 kg/m2  SpO2 95%  Review of Systems Denies neck pain.      Objective:   Physical Exam VITAL SIGNS:  See vs page GENERAL: no distress Neck: a healed scar is present.  i do not appreciate a nodule in the thyroid or elsewhere in the neck   Lab Results  Component Value Date   TSH 4.85* 06/19/2014   T4TOTAL 7.3 08/18/2006      Assessment & Plan:  Hypothyroidism, mild.  Borderline call about supplementation here.  The case in favor is that it might discourage growth of the remaining left lobe.   Urolithiasis, new, uncertain  etiology   Patient is advised the following: Patient Instructions  i have sent a prescription to your pharmacy, for a thyroid pill.  Please redo the blood tests in 1 month.  We'll let you know about the results.

## 2014-06-29 ENCOUNTER — Ambulatory Visit (INDEPENDENT_AMBULATORY_CARE_PROVIDER_SITE_OTHER): Payer: BLUE CROSS/BLUE SHIELD | Admitting: Psychology

## 2014-06-29 DIAGNOSIS — F902 Attention-deficit hyperactivity disorder, combined type: Secondary | ICD-10-CM | POA: Diagnosis not present

## 2014-07-12 ENCOUNTER — Ambulatory Visit: Payer: BLUE CROSS/BLUE SHIELD | Admitting: Psychology

## 2014-07-17 ENCOUNTER — Encounter: Payer: Self-pay | Admitting: Family Medicine

## 2014-07-17 ENCOUNTER — Ambulatory Visit (INDEPENDENT_AMBULATORY_CARE_PROVIDER_SITE_OTHER): Payer: BLUE CROSS/BLUE SHIELD | Admitting: Family Medicine

## 2014-07-17 ENCOUNTER — Telehealth: Payer: Self-pay | Admitting: Endocrinology

## 2014-07-17 VITALS — BP 104/70 | HR 74 | Temp 98.4°F | Wt 310.0 lb

## 2014-07-17 DIAGNOSIS — N200 Calculus of kidney: Secondary | ICD-10-CM | POA: Diagnosis not present

## 2014-07-17 DIAGNOSIS — E890 Postprocedural hypothyroidism: Secondary | ICD-10-CM | POA: Diagnosis not present

## 2014-07-17 DIAGNOSIS — R6882 Decreased libido: Secondary | ICD-10-CM

## 2014-07-17 DIAGNOSIS — F900 Attention-deficit hyperactivity disorder, predominantly inattentive type: Secondary | ICD-10-CM

## 2014-07-17 LAB — TSH: TSH: 5.84 u[IU]/mL — ABNORMAL HIGH (ref 0.35–4.50)

## 2014-07-17 LAB — VITAMIN D 25 HYDROXY (VIT D DEFICIENCY, FRACTURES): VITD: 20.93 ng/mL — ABNORMAL LOW (ref 30.00–100.00)

## 2014-07-17 LAB — T4, FREE: Free T4: 0.92 ng/dL (ref 0.60–1.60)

## 2014-07-17 LAB — T3, FREE: T3, Free: 3.6 pg/mL (ref 2.3–4.2)

## 2014-07-17 MED ORDER — DEXMETHYLPHENIDATE HCL ER 20 MG PO CP24
20.0000 mg | ORAL_CAPSULE | Freq: Every day | ORAL | Status: DC
Start: 2014-07-17 — End: 2014-08-13

## 2014-07-17 MED ORDER — DEXMETHYLPHENIDATE HCL ER 10 MG PO CP24: ORAL_CAPSULE | ORAL | Status: DC

## 2014-07-17 NOTE — Telephone Encounter (Signed)
[facility] pharmacy would like to speak with nurse   Patient would like to switch from generic to brand    Please advise   Thank you

## 2014-07-17 NOTE — Assessment & Plan Note (Signed)
Vyvanse not effective. Adderrall sexual side effects at first and now on XL not effective anymore. Will trial focalin 20mg  titrating to 40mg  over month. Trial concerta if this not effective. Also consider strattera. Consider desoxyn as last option but would discuss with other providers first given no FDA label for adult dosing but could be used off label.

## 2014-07-17 NOTE — Patient Instructions (Signed)
We will trial focalin XR.  20mg  for 1 week 30mg  for 1 week if symptoms not controlled on 20mg  40mg  for 1 week if symptoms not controlled on 30mg   2 weeks at 40mg   Follow up with me in office in another month Consider concerta as next step

## 2014-07-17 NOTE — Progress Notes (Signed)
Garret Reddish, MD Phone: [phone]  Subjective:   Robert [name] is a 39 y.o. year old very pleasant male patient who presents with the following:  ADHD- poor control Formal eval by Dr. Glennon Hamilton consistent with adult ADHD. Titrated up to 40mg  adderrall XR still with difficulty concentrating at last visit. Vyvanse in past did not help. Patient still very interested in desoxyn which does not have adult ADHD FDA labeling ROS- difficulty concentrating and staying on task, difficulty at both home and work  Low libido and difficulty with erections Difficulty with obtaining erections and if does only 80% hard/firm. Interested in Glide testing. Low sex drive. Thyroid meds have been adjusted but c ROS- no change in testicular size.   Past Medical History- Patient Active Problem List   Diagnosis Date Noted  . Anxiety state 01/11/2014    Priority: Medium  . Morbid obesity 01/11/2014    Priority: Medium  . ADHD (attention deficit hyperactivity disorder) 06/02/2011    Priority: Medium  . S/P partial thyroidectomy 02/28/2014    Priority: Low  . Cyst of mandible 12/22/2013    Priority: Low  . 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 Kidney stones    Medications- reviewed and updated Current Outpatient Prescriptions  Medication Sig Dispense Refill  . amphetamine-dextroamphetamine (ADDERALL XR) 20 MG 24 hr capsule Take 1-2 capsules (20-40 mg total) by mouth daily. 60 capsule 0  . levothyroxine (SYNTHROID, LEVOTHROID) 50 MCG tablet Take 1 tablet (50 mcg total) by mouth daily before breakfast. 30 tablet 11  . ALPRAZolam (XANAX) 0.25 MG tablet TAKE 1 TABLET BY MOUTH THREE TIMES DAILY AS NEEDED FOR ANXIETY (Patient not taking: Reported on 07/17/2014) 60 tablet 0  . dexmethylphenidate (FOCALIN XR) 10 MG 24 hr capsule Generic please. Add 1 pill after 7 days if 20mg  not effective. Add another pill (40mg  total) after another 7 days if 30mg  not effective. 40 capsule 0  . dexmethylphenidate  (FOCALIN XR) 20 MG 24 hr capsule Take 1 capsule (20 mg total) by mouth daily. Generic please 30 capsule 0  . Triamcinolone Acetonide (TRIAMCINOLONE 0.1 % CREAM : EUCERIN) CREA Apply 1 application topically 2 (two) times daily. (Patient not taking: Reported on 07/17/2014) 60 each 0   No current facility-administered medications for this visit.    Objective: BP 104/70 mmHg  Pulse 74  Temp(Src) 98.4 F (36.9 C)  Wt 310 lb (140.615 kg) Gen: NAD, resting comfortably CV: RRR no murmurs rubs or gallops Lungs: CTAB no crackles, wheeze, rhonchi Psych: rapid speech at times, nondepressed mood   Assessment/Plan:  ADHD (attention deficit hyperactivity disorder) Vyvanse not effective. Adderrall sexual side effects at first and now on XL not effective anymore. Will trial focalin 20mg  titrating to 40mg  over month. Trial concerta if this not effective. Also consider strattera. Consider desoxyn as last option but would discuss with other providers first given no FDA label for adult dosing but could be used off label.   Low libido and difficulty with erections Check testosterone today. If normal, consider ED meds. Not clear if low libido ever truly was related to adderall-this may have just been a longer term issue.    Orders Placed This Encounter  Procedures  . Testosterone, Free, Total, SHBG    Meds ordered this encounter  Medications  . dexmethylphenidate (FOCALIN XR) 20 MG 24 hr capsule    Sig: Take 1 capsule (20 mg total) by mouth daily. Generic please    Dispense:  30 capsule    Refill:  0  .  dexmethylphenidate (FOCALIN XR) 10 MG 24 hr capsule    Sig: Generic please. Add 1 pill after 7 days if 20mg  not effective. Add another pill (40mg  total) after another 7 days if 30mg  not effective.    Dispense:  40 capsule    Refill:  0

## 2014-07-18 LAB — TESTOSTERONE, FREE, TOTAL, SHBG
Sex Hormone Binding: 21 nmol/L (ref 10–50)
Testosterone, Free: 65.8 pg/mL (ref 47.0–244.0)
Testosterone-% Free: 2.5 % (ref 1.6–2.9)
Testosterone: 267 ng/dL — ABNORMAL LOW (ref 300–890)

## 2014-07-18 NOTE — Telephone Encounter (Signed)
Rx given to [facility], [place]

## 2014-07-19 ENCOUNTER — Ambulatory Visit (INDEPENDENT_AMBULATORY_CARE_PROVIDER_SITE_OTHER): Payer: BLUE CROSS/BLUE SHIELD | Admitting: Psychology

## 2014-07-19 DIAGNOSIS — F902 Attention-deficit hyperactivity disorder, combined type: Secondary | ICD-10-CM | POA: Diagnosis not present

## 2014-07-23 ENCOUNTER — Other Ambulatory Visit: Payer: Self-pay | Admitting: Endocrinology

## 2014-07-23 ENCOUNTER — Encounter: Payer: Self-pay | Admitting: Endocrinology

## 2014-07-23 DIAGNOSIS — E559 Vitamin D deficiency, unspecified: Secondary | ICD-10-CM | POA: Insufficient documentation

## 2014-07-23 DIAGNOSIS — E890 Postprocedural hypothyroidism: Secondary | ICD-10-CM

## 2014-07-23 MED ORDER — LEVOTHYROXINE SODIUM 75 MCG PO TABS: 75.0000 ug | ORAL_TABLET | Freq: Every day | ORAL | Status: DC

## 2014-07-23 MED ORDER — VITAMIN D (ERGOCALCIFEROL) 1.25 MG (50000 UNIT) PO CAPS: 50000.0000 [IU] | ORAL_CAPSULE | ORAL | Status: DC

## 2014-08-13 ENCOUNTER — Encounter: Payer: Self-pay | Admitting: Family Medicine

## 2014-08-13 ENCOUNTER — Ambulatory Visit (INDEPENDENT_AMBULATORY_CARE_PROVIDER_SITE_OTHER): Payer: BLUE CROSS/BLUE SHIELD | Admitting: Family Medicine

## 2014-08-13 VITALS — BP 110/78 | HR 93 | Temp 98.3°F | Wt 291.0 lb

## 2014-08-13 DIAGNOSIS — E890 Postprocedural hypothyroidism: Secondary | ICD-10-CM | POA: Diagnosis not present

## 2014-08-13 DIAGNOSIS — F900 Attention-deficit hyperactivity disorder, predominantly inattentive type: Secondary | ICD-10-CM

## 2014-08-13 MED ORDER — DEXMETHYLPHENIDATE HCL ER 40 MG PO CP24
40.0000 mg | ORAL_CAPSULE | Freq: Every day | ORAL | Status: DC
Start: 2014-08-13 — End: 2014-08-13

## 2014-08-13 MED ORDER — DEXMETHYLPHENIDATE HCL ER 40 MG PO CP24: 40.0000 mg | ORAL_CAPSULE | Freq: Every day | ORAL | Status: DC

## 2014-08-13 NOTE — Assessment & Plan Note (Signed)
S: best control he has had so far when compared to vyvanse and adderrall. He is pleased at the 30mg  and hopes for even more focus on 40mg  which he started in last 1-2 days and is already noticing difference A/P: continue current rx. See prior notes for next steps if becomes ineffective in future

## 2014-08-13 NOTE — Assessment & Plan Note (Signed)
S: weight down 20 lbs. Going to [facility] for hypnotherapy. Has been able to cut calories but needs to work on exercise A/P: pleased with progress through hypnotherapy. Hopeful this is truly related to lifestyle changes instead of potential hyperthyroidism due to how he is taking levothyroxine.

## 2014-08-13 NOTE — Assessment & Plan Note (Signed)
S: normally managed by Dr. Loanne Drilling. Patient admits to me today though that he has been taking 125 mcg of levothyoxine (combining old 56mcg and new 75 mcg) and states this is the best he has felt in years. His skin is less dry, hair is softer, energy much better. He started this increased dosing regimen after a few days of 75 mcg and not noting a difference A/P: instructed patient strongly to return to 97mcg levothyroxine and return for TSH as previously ordered in 4 weeks, discussed that if further adjustments need to be made that Dr. Loanne Drilling will make this and dissuaded him from making any changes without medical advice.

## 2014-08-13 NOTE — Patient Instructions (Signed)
Go back to 5mcg levothyroxine. Go to [facility] 4 weeks from today for TSH and Vitamin D check. I will let Dr. Loanne Drilling know that you are feeling better.   Glad focalin is doing better. Let's use 40mg  daily for 3 months. See me back at 3 months if not doing as well as you would like. Otherwise, call in 3 months and we will let you pick up 3 months and see me in 6 months.   Great job with the weight loss! [place] is working for you- let him know I said hello.

## 2014-08-13 NOTE — Progress Notes (Signed)
Robert Reddish, MD  Subjective:  Robert [name] is a 39 y.o. year old very pleasant male patient who presents with:  See problem oriented charting- ROS-No changes. No heat/cold intolerance. No constipation or diarrhea. Denies shakiness or anxiety. No palpitations. No chest pain, shortness of breath, blurry vision  Past Medical History- anxiety with xanax a few days out of every 2-3 months  Medications- reviewed and updated Current Outpatient Prescriptions  Medication Sig Dispense Refill  . amphetamine-dextroamphetamine (ADDERALL XR) 20 MG 24 hr capsule Take 1-2 capsules (20-40 mg total) by mouth daily. 60 capsule 0  . dexmethylphenidate (FOCALIN XR) 10 MG 24 hr capsule Generic please. Add 1 pill after 7 days if 20mg  not effective. Add another pill (40mg  total) after another 7 days if 30mg  not effective. 40 capsule 0  . dexmethylphenidate (FOCALIN XR) 20 MG 24 hr capsule Take 1 capsule (20 mg total) by mouth daily. Generic please 30 capsule 0  . levothyroxine (SYNTHROID, LEVOTHROID) 75 MCG tablet Take 1 tablet (75 mcg total) by mouth daily before breakfast. 30 tablet 11  . Vitamin D, Ergocalciferol, (DRISDOL) 50000 UNITS CAPS capsule Take 1 capsule (50,000 Units total) by mouth every 7 (seven) days. 8 capsule 0  . ALPRAZolam (XANAX) 0.25 MG tablet TAKE 1 TABLET BY MOUTH THREE TIMES DAILY AS NEEDED FOR ANXIETY (Patient not taking: Reported on 07/17/2014) 60 tablet 0  . Triamcinolone Acetonide (TRIAMCINOLONE 0.1 % CREAM : EUCERIN) CREA Apply 1 application topically 2 (two) times daily. (Patient not taking: Reported on 08/13/2014) 60 each 0   No current facility-administered medications for this visit.    Objective: BP 110/78 mmHg  Pulse 93  Temp(Src) 98.3 F (36.8 C)  Wt 291 lb (131.997 kg) Gen: NAD, resting comfortably CV: RRR no murmurs rubs or gallops Lungs: CTAB no crackles, wheeze, rhonchi Abdomen: soft/nontender/nondistended/normal bowel sounds. No rebound or guarding. Difference  noted in weight.  Ext: no edema Skin: warm, dry, no rash  Neuro: grossly normal, moves all extremities, normal gait   Assessment/Plan:  ADHD (attention deficit hyperactivity disorder) S: best control he has had so far when compared to vyvanse and adderrall. He is pleased at the 30mg  and hopes for even more focus on 40mg  which he started in last 1-2 days and is already noticing difference A/P: continue current rx. See prior notes for next steps if becomes ineffective in future    Morbid obesity S: weight down 20 lbs. Going to [facility] for hypnotherapy. Has been able to cut calories but needs to work on exercise A/P: pleased with progress through hypnotherapy. Hopeful this is truly related to lifestyle changes instead of potential hyperthyroidism due to how he is taking levothyroxine.        Post-surgical hypothyroidism S: normally managed by Dr. Loanne Drilling. Patient admits to me today though that he has been taking 125 mcg of levothyoxine (combining old 19mcg and new 75 mcg) and states this is the best he has felt in years. His skin is less dry, hair is softer, energy much better. He started this increased dosing regimen after a few days of 75 mcg and not noting a difference A/P: instructed patient strongly to return to 4mcg levothyroxine and return for TSH as previously ordered in 4 weeks, discussed that if further adjustments need to be made that Dr. Loanne Drilling will make this and dissuaded him from making any changes without medical advice.      Meds ordered this encounter  Medications  . : dexmethylphenidate 40 MG CP24  Sig: Take 1 capsule (40 mg total) by mouth daily. Generic please. May fill 08/12/13    Dispense:  30 capsule    Refill:  0  .  Dexmethylphenidate HCl 40 MG CP24    Sig: Take 1 capsule (40 mg total) by mouth daily. Generic please. May fill 09/12/13    Dispense:  30 capsule    Refill:  0  . Dexmethylphenidate HCl 40 MG CP24    Sig: Take 1 capsule (40 mg total)  by mouth daily. Generic please. May fill 10/12/13    Dispense:  30 capsule    Refill:  0

## 2014-08-22 ENCOUNTER — Other Ambulatory Visit: Payer: Self-pay

## 2014-08-22 MED ORDER — VITAMIN D (ERGOCALCIFEROL) 1.25 MG (50000 UNIT) PO CAPS: 50000.0000 [IU] | ORAL_CAPSULE | ORAL | Status: DC

## 2014-08-22 MED ORDER — LEVOTHYROXINE SODIUM 75 MCG PO TABS: 75.0000 ug | ORAL_TABLET | Freq: Every day | ORAL | Status: DC

## 2014-08-28 ENCOUNTER — Other Ambulatory Visit: Payer: Self-pay

## 2014-08-28 MED ORDER — LEVOTHYROXINE SODIUM 75 MCG PO TABS: 75.0000 ug | ORAL_TABLET | Freq: Every day | ORAL | Status: DC

## 2014-08-28 MED ORDER — VITAMIN D (ERGOCALCIFEROL) 1.25 MG (50000 UNIT) PO CAPS: 50000.0000 [IU] | ORAL_CAPSULE | ORAL | Status: DC

## 2014-09-04 ENCOUNTER — Encounter: Payer: Self-pay | Admitting: Endocrinology

## 2014-09-05 ENCOUNTER — Encounter: Payer: Self-pay | Admitting: Endocrinology

## 2014-09-05 ENCOUNTER — Ambulatory Visit (INDEPENDENT_AMBULATORY_CARE_PROVIDER_SITE_OTHER): Payer: BLUE CROSS/BLUE SHIELD | Admitting: Endocrinology

## 2014-09-05 VITALS — BP 136/87 | HR 96 | Temp 98.1°F | Ht 74.0 in | Wt 285.0 lb

## 2014-09-05 DIAGNOSIS — E559 Vitamin D deficiency, unspecified: Secondary | ICD-10-CM

## 2014-09-05 DIAGNOSIS — E890 Postprocedural hypothyroidism: Secondary | ICD-10-CM

## 2014-09-05 LAB — VITAMIN D 25 HYDROXY (VIT D DEFICIENCY, FRACTURES): VITD: 23.7 ng/mL — ABNORMAL LOW (ref 30.00–100.00)

## 2014-09-05 LAB — TSH: TSH: 1.38 u[IU]/mL (ref 0.35–4.50)

## 2014-09-05 NOTE — Progress Notes (Signed)
Subjective:    Patient ID: Robert [name], male    DOB: 10-01-1975, 39 y.o.   MRN: [id number]  HPI  Pt returns for f/u mild of postsurgical hypothyroidism (in 2015, pt had right thyroid lobectomy for a suspicious nodule; pathol was benign).  He feels better in general since he has been on the synthroid. pt also returns for f/u of vitamin-D deficiency (dx'ed 2015; in 2016 he his first-ever episode of urolithiasis (but the one stone was lost, so he has never had stone analysis)).  Denies muscle cramps.  He has been on ergocalciferol x 8 weeks.   Past Medical History  Diagnosis Date  . Chronic sinusitis   . ADD (attention deficit disorder) without hyperactivity     according to his mother/ work test possible  . Sleep apnea   . Headache   . Kidney stones     Past Surgical History  Procedure Laterality Date  . Nasal sinus surgery    . Hernia repair      pt. was an infant when this surgery occured  . Thyroid surgery Right 02/28/2014    DR TEOH    PARTIAL THYROIDECTOMY  . Thyroidectomy Right 02/28/2014    Procedure: RIGHT HEMI THYROIDECTOMY;  Surgeon: Ascencion Dike, MD;  Location: [place];  Service: ENT;  Laterality: Right;    History   Social History  . Marital Status: Single    Spouse Name: N/A  . Number of Children: N/A  . Years of Education: N/A   Occupational History  . Not on file.   Social History Main Topics  . Smoking status: Never Smoker   . Smokeless tobacco: Never Used  . Alcohol Use: Yes     Comment: social  . Drug Use: No  . Sexual Activity: Yes   Other Topics Concern  . Not on file   Social History Narrative   Single but with live in girlfriend. No kids.       Works time [employer] of 1 pets dog and cats.    Current Outpatient Prescriptions on File Prior to Visit  Medication Sig Dispense Refill  . ALPRAZolam (XANAX) 0.25 MG tablet TAKE 1 TABLET BY MOUTH THREE TIMES DAILY AS NEEDED FOR ANXIETY 60 tablet 0  . Dexmethylphenidate HCl 40 MG CP24 Take 1  capsule (40 mg total) by mouth daily. Generic please. May fill 10/12/13 30 capsule 0  . levothyroxine (SYNTHROID, LEVOTHROID) 75 MCG tablet Take 1 tablet (75 mcg total) by mouth daily before breakfast. 90 tablet 1  . Triamcinolone Acetonide (TRIAMCINOLONE 0.1 % CREAM : EUCERIN) CREA Apply 1 application topically 2 (two) times daily. 60 each 0  . Vitamin D, Ergocalciferol, (DRISDOL) 50000 UNITS CAPS capsule Take 1 capsule (50,000 Units total) by mouth every 7 (seven) days. 12 capsule 1   No current facility-administered medications on file prior to visit.    Allergies  Allergen Reactions  . Penicillins Anaphylaxis  . Milk-Related Compounds Other (See Comments)    GI issues   . Topamax Other (See Comments)    Is allergic when taking while drinking alcohol    Family History  Problem Relation Age of Onset  . Hyperlipidemia Mother   . Hypertension Mother   . Thyroid disease Neg Hx     BP 136/87 mmHg  Pulse 96  Temp(Src) 98.1 F (36.7 C) (Oral)  Ht 6\' 2"  (1.88 m)  Wt 285 lb (129.275 kg)  BMI 36.58 kg/m2  SpO2 98%  Review  of Systems He has intermittent excessive diaphoresis.     Objective:   Physical Exam VITAL SIGNS:  See vs page.   GENERAL: no distress.  Neck: a healed scar is present.  i do not appreciate a nodule in the thyroid or elsewhere in the neck.     Lab Results  Component Value Date   TSH 1.38 09/05/2014   T4TOTAL 7.3 08/18/2006  vit-D=23    Assessment & Plan:  Hypothyroidism: well-replaced.   Patient is advised the following: Patient Instructions  blood tests are requested for you today.  We'll let you know about the results. I would be happy to see you back here whenever you want.    addendum: Please continue the same vitamin-D. Please redo the blood tests in 1 month.

## 2014-09-05 NOTE — Patient Instructions (Addendum)
blood tests are requested for you today.  We'll let you know about the results. I would be happy to see you back here whenever you want.

## 2014-09-06 LAB — PTH, INTACT AND CALCIUM
Calcium: 9.6 mg/dL (ref 8.4–10.5)
PTH: 12 pg/mL — ABNORMAL LOW (ref 14–64)

## 2014-09-17 ENCOUNTER — Encounter: Payer: Self-pay | Admitting: Family Medicine

## 2014-09-18 ENCOUNTER — Other Ambulatory Visit: Payer: Self-pay

## 2014-09-18 MED ORDER — ALPRAZOLAM 0.25 MG PO TABS: ORAL_TABLET | ORAL | Status: DC

## 2014-09-26 ENCOUNTER — Encounter: Payer: Self-pay | Admitting: Family Medicine

## 2014-09-27 ENCOUNTER — Encounter: Payer: Self-pay | Admitting: Family Medicine

## 2014-09-27 ENCOUNTER — Ambulatory Visit (INDEPENDENT_AMBULATORY_CARE_PROVIDER_SITE_OTHER): Payer: BLUE CROSS/BLUE SHIELD | Admitting: Family Medicine

## 2014-09-27 VITALS — BP 110/84 | HR 89 | Temp 98.5°F | Wt 284.0 lb

## 2014-09-27 DIAGNOSIS — F329 Major depressive disorder, single episode, unspecified: Secondary | ICD-10-CM | POA: Insufficient documentation

## 2014-09-27 DIAGNOSIS — N5201 Erectile dysfunction due to arterial insufficiency: Secondary | ICD-10-CM

## 2014-09-27 DIAGNOSIS — R21 Rash and other nonspecific skin eruption: Secondary | ICD-10-CM

## 2014-09-27 DIAGNOSIS — N529 Male erectile dysfunction, unspecified: Secondary | ICD-10-CM | POA: Insufficient documentation

## 2014-09-27 DIAGNOSIS — F321 Major depressive disorder, single episode, moderate: Secondary | ICD-10-CM | POA: Diagnosis not present

## 2014-09-27 MED ORDER — TRIAMCINOLONE 0.1 % CREAM:EUCERIN CREAM 1:1
1.0000 | TOPICAL_CREAM | Freq: Two times a day (BID) | CUTANEOUS | Status: DC
Start: 2014-09-27 — End: 2015-06-23

## 2014-09-27 MED ORDER — TADALAFIL 20 MG PO TABS
10.0000 mg | ORAL_TABLET | ORAL | Status: DC | PRN
Start: 2014-09-27 — End: 2014-10-19

## 2014-09-27 MED ORDER — FLUOXETINE HCL 20 MG PO TABS: 20.0000 mg | ORAL_TABLET | Freq: Every day | ORAL | Status: DC

## 2014-09-27 NOTE — Patient Instructions (Addendum)
See me as soon as you get back from honeymoon congrats on marriage  Prozac 20mg  for depression Cialis for erectile dysfunction Triamcinolone for 2 weeks. Shorts only at work.  See letter   Taking the medicine as directed and not missing any doses is one of the best things you can do to treat your depression.  Here are some things to keep in mind:  1) Side effects (stomach upset, some increased anxiety) may happen before you notice a benefit.  These side effects typically go away over time. 2) Changes to your dose of medicine or a change in medication all together is sometimes necessary 3) Most people need to be on medication at least 6-12 months 4) Many people will notice an improvement within two weeks but the full effect of the medication can take up to 4-6 weeks 5) Stopping the medication when you start feeling better often results in a return of symptoms 6) If you start having thoughts of hurting yourself or others after starting this medicine, please call me at [phone] immediately.

## 2014-09-27 NOTE — Assessment & Plan Note (Signed)
Sounds like itch, scratch cycle. Letter written to avoid irritant of pants at work. Triamcinolone full 2 week trial advised. Follow up after honeymoon, may need derm referral given recurrence but unclear if anything could be done differently than prn steroids.

## 2014-09-27 NOTE — Assessment & Plan Note (Signed)
PHQ9 19. DIscussed SI risk on prozac. Start 20mg . Follow up after honeymoon but gave reasons to seek care. Also needs cialis for ED and likely worsening symptoms when he starts prozac. Avoid wellbutrin due to anxiety.
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
## 2014-10-08 ENCOUNTER — Ambulatory Visit: Payer: Self-pay | Admitting: Family Medicine

## 2014-10-19 ENCOUNTER — Encounter: Payer: Self-pay | Admitting: Family Medicine

## 2014-10-19 ENCOUNTER — Ambulatory Visit (INDEPENDENT_AMBULATORY_CARE_PROVIDER_SITE_OTHER): Payer: BLUE CROSS/BLUE SHIELD | Admitting: Family Medicine

## 2014-10-19 VITALS — BP 130/76 | HR 79 | Temp 98.9°F | Wt 285.0 lb

## 2014-10-19 DIAGNOSIS — E559 Vitamin D deficiency, unspecified: Secondary | ICD-10-CM | POA: Diagnosis not present

## 2014-10-19 DIAGNOSIS — F900 Attention-deficit hyperactivity disorder, predominantly inattentive type: Secondary | ICD-10-CM

## 2014-10-19 DIAGNOSIS — E890 Postprocedural hypothyroidism: Secondary | ICD-10-CM

## 2014-10-19 DIAGNOSIS — F321 Major depressive disorder, single episode, moderate: Secondary | ICD-10-CM | POA: Diagnosis not present

## 2014-10-19 DIAGNOSIS — N5201 Erectile dysfunction due to arterial insufficiency: Secondary | ICD-10-CM

## 2014-10-19 DIAGNOSIS — E039 Hypothyroidism, unspecified: Secondary | ICD-10-CM | POA: Diagnosis not present

## 2014-10-19 DIAGNOSIS — R21 Rash and other nonspecific skin eruption: Secondary | ICD-10-CM

## 2014-10-19 NOTE — Assessment & Plan Note (Signed)
S: reported issues "trouble getting fully firm/maintaining. Requests cialis for honeymoon." from last visit. Today, he denies any issues- reports only issues on prozac A/P: unclear if truly an issue due to varying patient reports, D/c cialis

## 2014-10-19 NOTE — Patient Instructions (Addendum)
Dr. Loanne Drilling already ordered the labs you are requesting, our lab can release them and send results to him.   I am sorry we have had a hard time finding the right balance for you for depression, anxiety, ADHD (main thing) control. Gave you information for a psychiatrist- [facility] that I think can help you find a better balance. Happy to see you if something comes up before then or as needed and at least yearly  You can use triamcinolone for 2 weeks twice a day to try to knock out the leg rash. Consider derm referral if does not resolve.

## 2014-10-19 NOTE — Assessment & Plan Note (Signed)
Failed focalin, adderall, aderall XR, vyvanse. GIven multiple attempts without improved control combined with tolerability- referred to psychiatry at this point. Co issues of anxiety and depression make balance difficult as well.

## 2014-10-19 NOTE — Assessment & Plan Note (Signed)
Advised full 2 week course of triamcinolone (noncompliant). Consider derm if truly tries and fails.

## 2014-10-19 NOTE — Progress Notes (Signed)
Garret Reddish, MD  Subjective:  Robert [name] is a 39 y.o. year old very pleasant male patient who presents with:  Depression -PHQ9 of 50. prozac 20mg  started on 09/27/14. Took for 2-3 days and did well except sexual side effects. PHQ9 of 8 today despite not taking the prozac. Got married, went on honeymoon, feeels in much better place. 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 Feels easily pulled away from a topic. Has tried 4 separate medications at this point  Rash- had advised triamcinolone 2 weeks- patient states poor compliance  Past Medical History- morbid obesity, anxiety, hypothyroidism  Medications- reviewed and updated Current Outpatient Prescriptions  Medication Sig Dispense Refill  . Dexmethylphenidate HCl 40 MG CP24 Take 1 capsule (40 mg total) by mouth daily. Generic please. May fill 10/12/13 30 capsule 0  . levothyroxine (SYNTHROID, LEVOTHROID) 75 MCG tablet Take 1 tablet (75 mcg total) by mouth daily before breakfast. 90 tablet 1  . Vitamin D, Ergocalciferol, (DRISDOL) 50000 UNITS CAPS capsule Take 1 capsule (50,000 Units total) by mouth every 7 (seven) days. 12 capsule 1  . ALPRAZolam (XANAX) 0.25 MG tablet TAKE 1 TABLET BY MOUTH THREE TIMES DAILY AS NEEDED FOR ANXIETY (Patient not taking: Reported on 09/27/2014) 60 tablet 1  . tadalafil (CIALIS) 20 MG tablet Take 0.5-1 tablets (10-20 mg total) by mouth every other day as needed for erectile dysfunction. (Patient not taking: Reported on 10/19/2014) 5 tablet 1  . Triamcinolone Acetonide (TRIAMCINOLONE 0.1 % CREAM : EUCERIN) CREA Apply 1 application topically 2 (two) times daily. For 2 weeks (Patient not taking: Reported on 10/19/2014) 60 each 0   Objective: BP 130/76 mmHg  Pulse 79  Temp(Src) 98.9 F (37.2 C)  Wt 285 lb (129.275 kg) Gen: NAD, speaks quickly, distractable CV: RRR no murmurs  rubs or gallops Lungs: CTAB no crackles, wheeze, rhonchi Abdomen: soft/nontender/nondistended/normal bowel sounds. Obese.  Ext: no edema Skin: warm, dry, slight erythema and scale over left shin Neuro: grossly normal, moves all extremities   Assessment/Plan:  Depression, major Primarily related to work issues. PHQ9 peaked at 13, hoping for job change. Did not tolerate prozac due to sexual dysfunction despite trying cialis. He has stopped meds and PHQ9 of 8. Advised to seek psychiatry visit as counseling could likely better control depression without meds given SE concerns. Wellbutrin may worsen anxiety so avoiding.   ADHD (attention deficit hyperactivity disorder) Failed focalin, adderall, aderall XR, vyvanse. GIven multiple attempts without improved control combined with tolerability- referred to psychiatry at this point. Co issues of anxiety and depression make balance difficult as well.   Rash Advised full 2 week course of triamcinolone (noncompliant). Consider derm if truly tries and fails.   Erectile dysfunction S: reported issues "trouble getting fully firm/maintaining. Requests cialis for honeymoon." from last visit. Today, he denies any issues- reports only issues on prozac A/P: unclear if truly an issue due to varying patient reports, D/c cialis   Patient to also get labs previously ordered by Dr. Loanne Drilling Patient may see me in August if unable to get an appointment with psych, though if he has an appointment within 3 months, willing to refill meds without visit. Agrees to call and cancel if doesn't need visit.   >50% of 25 minute office visit was spent on counseling (depression, anxiety, poor control of ADHD despite multiple meds) and coordination of care

## 2014-10-19 NOTE — Assessment & Plan Note (Signed)
Primarily related to work issues. PHQ9 peaked at 6, hoping for job change. Did not tolerate prozac due to sexual dysfunction despite trying cialis. He has stopped meds and PHQ9 of 8. Advised to seek psychiatry visit as counseling could likely better control depression without meds given SE concerns. Wellbutrin may worsen anxiety so avoiding.

## 2014-10-20 LAB — VITAMIN D 25 HYDROXY (VIT D DEFICIENCY, FRACTURES): Vit D, 25-Hydroxy: 33 ng/mL (ref 30–100)

## 2014-10-20 LAB — TSH: TSH: 1.905 u[IU]/mL (ref 0.350–4.500)

## 2014-10-22 LAB — PTH, INTACT AND CALCIUM
Calcium: 10.3 mg/dL (ref 8.4–10.5)
PTH: 19 pg/mL (ref 14–64)

## 2014-11-05 ENCOUNTER — Ambulatory Visit: Payer: BLUE CROSS/BLUE SHIELD | Admitting: Family Medicine

## 2014-11-05 ENCOUNTER — Ambulatory Visit (INDEPENDENT_AMBULATORY_CARE_PROVIDER_SITE_OTHER): Payer: BLUE CROSS/BLUE SHIELD | Admitting: Physician Assistant

## 2014-11-05 VITALS — BP 136/78 | HR 95 | Temp 97.7°F | Resp 18 | Ht 74.0 in | Wt 283.6 lb

## 2014-11-05 DIAGNOSIS — J069 Acute upper respiratory infection, unspecified: Secondary | ICD-10-CM | POA: Diagnosis not present

## 2014-11-05 MED ORDER — IPRATROPIUM BROMIDE 0.03 % NA SOLN: 2.0000 | Freq: Two times a day (BID) | NASAL | Status: DC

## 2014-11-05 MED ORDER — BENZONATATE 100 MG PO CAPS: 100.0000 mg | ORAL_CAPSULE | Freq: Three times a day (TID) | ORAL | Status: DC | PRN

## 2014-11-05 NOTE — Progress Notes (Signed)
[facility] [address] [place] [address] [phone]  Date:  11/05/2014   Name:  Robert [name]   DOB:  1975/09/30   MRN:  [id number]  PCP:  Garret Reddish, MD    Chief Complaint: Sore Throat; Cough; Headache; Nasal Congestion; and Temporomandibular Joint Pain   History of Present Illness:  This is a 39 y.o. male with PMH hypothyroidism, anxiety and vit D def who is presenting with uri sx for 3 days. Started with sore throat, nasal congestion and cough. Cough is productive of yellow sputum. This morning occipital headache started.  Has had some chills but no fevers. Denies otalgia, SOB, wheezing. No history of asthma or allergies. No tob use. Was prescribed cipro to take in case of travelers diarrhea while traveling 1 month ago but never took. He has been taking cipro for past 2 days. Also took tessalon perles that were left over and states this helped.  Review of Systems:  Review of Systems See HPI  Patient Active Problem List   Diagnosis Date Noted  . Depression, major 09/27/2014  . Rash 09/27/2014  . Erectile dysfunction 09/27/2014  . Vitamin D deficiency 07/23/2014  . 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 S/P partial thyroidectomy 02/28/2014  . Anxiety state 01/11/2014  . Morbid obesity 01/11/2014  . Cyst of mandible 12/22/2013  . ADHD (attention deficit hyperactivity disorder) 06/02/2011    Prior to Admission medications   Medication Sig Start Date End Date Taking? Authorizing Provider  ALPRAZolam (XANAX) 0.25 MG tablet TAKE 1 TABLET BY MOUTH THREE TIMES DAILY AS NEEDED FOR ANXIETY 09/18/14  Yes Marin Olp, MD  Dexmethylphenidate HCl 40 MG CP24 Take 1 capsule (40 mg total) by mouth daily. Generic please. May fill 10/12/13 08/13/14  Yes Marin Olp, MD  levothyroxine (SYNTHROID, LEVOTHROID) 75 MCG tablet Take 1 tablet (75 mcg total) by mouth daily before breakfast. 08/28/14  Yes Renato Shin, MD  Triamcinolone Acetonide  (TRIAMCINOLONE 0.1 % CREAM : EUCERIN) CREA Apply 1 application topically 2 (two) times daily. For 2 weeks 09/27/14  Yes Marin Olp, MD  Vitamin D, Ergocalciferol, (DRISDOL) 50000 UNITS CAPS capsule Take 1 capsule (50,000 Units total) by mouth every 7 (seven) days. 08/28/14  Yes Renato Shin, MD    Allergies  Allergen Reactions  . Penicillins Anaphylaxis  . Milk-Related Compounds Other (See Comments)    GI issues   . Topamax Other (See Comments)    Is allergic when taking while drinking alcohol    Past Surgical History  Procedure Laterality Date  . Nasal sinus surgery    . Hernia repair      pt. was an infant when this surgery occured  . Thyroid surgery Right 02/28/2014    DR TEOH    PARTIAL THYROIDECTOMY  . Thyroidectomy Right 02/28/2014    Procedure: RIGHT HEMI THYROIDECTOMY;  Surgeon: Ascencion Dike, MD;  Location: [facility];  Service: ENT;  Laterality: Right;    History  Substance Use Topics  . Smoking status: Never Smoker   . Smokeless tobacco: Never Used  . Alcohol Use: Yes     Comment: social    Family History  Problem Relation Age of Onset  . Hyperlipidemia Mother   . Hypertension Mother   . Thyroid disease Neg Hx     Medication list has been reviewed and updated.  Physical Examination:  Physical Exam  Constitutional: He is oriented to person, place, and time. He appears well-developed and well-nourished. No  distress.  HENT:  Head: Normocephalic and atraumatic.  Right Ear: Hearing, tympanic membrane, external ear and ear canal normal.  Left Ear: Hearing, tympanic membrane, external ear and ear canal normal.  Nose: Nose normal. Right sinus exhibits no maxillary sinus tenderness and no frontal sinus tenderness. Left sinus exhibits no maxillary sinus tenderness and no frontal sinus tenderness.  Mouth/Throat: Uvula is midline and mucous membranes are normal. Posterior oropharyngeal erythema present. No oropharyngeal exudate or posterior oropharyngeal edema.  Eyes:  Conjunctivae and lids are normal. Right eye exhibits no discharge. Left eye exhibits no discharge. No scleral icterus.  Cardiovascular: Normal rate, regular rhythm, normal heart sounds and normal pulses.   No murmur heard. Pulmonary/Chest: Effort normal and breath sounds normal. No respiratory distress. He has no wheezes. He has no rhonchi. He has no rales.  Musculoskeletal: Normal range of motion.  Lymphadenopathy:       Head (right side): No submental, no submandibular and no tonsillar adenopathy present.       Head (left side): No submental, no submandibular and no tonsillar adenopathy present.    He has no cervical adenopathy.  Neurological: He is alert and oriented to person, place, and time.  Skin: Skin is warm, dry and intact. No lesion and no rash noted.  Psychiatric: He has a normal mood and affect. His speech is normal and behavior is normal. Thought content normal.   BP 136/78 mmHg  Pulse 95  Temp(Src) 97.7 F (36.5 C) (Oral)  Resp 18  Ht 6\' 2"  (1.88 m)  Wt 283 lb 9.6 oz (128.64 kg)  BMI 36.40 kg/m2  SpO2 98%  Assessment and Plan:  1. Viral URI Pt likely with a viral URI. Focus on supportive care, see meds prescribed below. Work note provided. Counseled on the importance of not taking leftover abx. He understands. If not getting better in 7-10 days, RTC. - ipratropium (ATROVENT) 0.03 % nasal spray; Place 2 sprays into both nostrils 2 (two) times daily.  Dispense: 30 mL; Refill: 0 - benzonatate (TESSALON) 100 MG capsule; Take 1-2 capsules (100-200 mg total) by mouth 3 (three) times daily as needed for cough.  Dispense: 40 capsule; Refill: 0   Benjaman Pott. Drenda Freeze, MHS [facility]  11/05/2014

## 2014-11-05 NOTE — Patient Instructions (Signed)
Drink plenty of water (64 oz/day) and get plenty of rest. If you have been prescribed a nasal spray, use twice a day. Use tessalon as directed. Do not take leftover antibiotics at home!! Call if you need a cough syrup to help you sleep at night. If your symptoms are not improving in 7-10 days, return to clinic.

## 2014-12-04 ENCOUNTER — Emergency Department (HOSPITAL_COMMUNITY): Payer: BLUE CROSS/BLUE SHIELD

## 2014-12-04 ENCOUNTER — Encounter (HOSPITAL_COMMUNITY): Payer: Self-pay | Admitting: Emergency Medicine

## 2014-12-04 ENCOUNTER — Emergency Department (HOSPITAL_COMMUNITY)
Admission: EM | Admit: 2014-12-04 | Discharge: 2014-12-04 | Disposition: A | Discharge status: Home / Self Care | Payer: BLUE CROSS/BLUE SHIELD | Attending: Emergency Medicine | Admitting: Emergency Medicine

## 2014-12-04 DIAGNOSIS — S39012A Strain of muscle, fascia and tendon of lower back, initial encounter: Secondary | ICD-10-CM | POA: Insufficient documentation

## 2014-12-04 DIAGNOSIS — S161XXA Strain of muscle, fascia and tendon at neck level, initial encounter: Secondary | ICD-10-CM | POA: Diagnosis not present

## 2014-12-04 DIAGNOSIS — Z8669 Personal history of other diseases of the nervous system and sense organs: Secondary | ICD-10-CM | POA: Insufficient documentation

## 2014-12-04 DIAGNOSIS — S3992XA Unspecified injury of lower back, initial encounter: Secondary | ICD-10-CM | POA: Insufficient documentation

## 2014-12-04 DIAGNOSIS — Z880 Allergy status to penicillin: Secondary | ICD-10-CM | POA: Insufficient documentation

## 2014-12-04 DIAGNOSIS — S4992XA Unspecified injury of left shoulder and upper arm, initial encounter: Secondary | ICD-10-CM | POA: Insufficient documentation

## 2014-12-04 DIAGNOSIS — S60812A Abrasion of left wrist, initial encounter: Secondary | ICD-10-CM | POA: Diagnosis not present

## 2014-12-04 DIAGNOSIS — Z87442 Personal history of urinary calculi: Secondary | ICD-10-CM | POA: Diagnosis not present

## 2014-12-04 DIAGNOSIS — Z8709 Personal history of other diseases of the respiratory system: Secondary | ICD-10-CM | POA: Diagnosis not present

## 2014-12-04 DIAGNOSIS — Y9389 Activity, other specified: Secondary | ICD-10-CM | POA: Insufficient documentation

## 2014-12-04 DIAGNOSIS — Y92410 Unspecified street and highway as the place of occurrence of the external cause: Secondary | ICD-10-CM | POA: Insufficient documentation

## 2014-12-04 DIAGNOSIS — Z79899 Other long term (current) drug therapy: Secondary | ICD-10-CM | POA: Diagnosis not present

## 2014-12-04 DIAGNOSIS — S80211A Abrasion, right knee, initial encounter: Secondary | ICD-10-CM | POA: Insufficient documentation

## 2014-12-04 DIAGNOSIS — R55 Syncope and collapse: Secondary | ICD-10-CM | POA: Insufficient documentation

## 2014-12-04 DIAGNOSIS — Y998 Other external cause status: Secondary | ICD-10-CM | POA: Insufficient documentation

## 2014-12-04 DIAGNOSIS — T148XXA Other injury of unspecified body region, initial encounter: Secondary | ICD-10-CM

## 2014-12-04 DIAGNOSIS — Z8659 Personal history of other mental and behavioral disorders: Secondary | ICD-10-CM | POA: Insufficient documentation

## 2014-12-04 DIAGNOSIS — S199XXA Unspecified injury of neck, initial encounter: Secondary | ICD-10-CM | POA: Diagnosis present

## 2014-12-04 MED ORDER — CYCLOBENZAPRINE HCL 10 MG PO TABS: 10.0000 mg | ORAL_TABLET | Freq: Two times a day (BID) | ORAL | Status: DC | PRN

## 2014-12-04 MED ORDER — HYDROCODONE-ACETAMINOPHEN 5-325 MG PO TABS: 2.0000 | ORAL_TABLET | ORAL | Status: DC | PRN
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
## 2014-12-04 NOTE — Discharge Instructions (Signed)
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
## 2014-12-05 ENCOUNTER — Telehealth: Payer: Self-pay

## 2014-12-05 NOTE — Telephone Encounter (Signed)
See below

## 2014-12-05 NOTE — Telephone Encounter (Signed)
Letter placed up front for pt pick up. Pt notified.
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
## 2014-12-05 NOTE — Telephone Encounter (Signed)
Rx written for pt.

## 2014-12-21 ENCOUNTER — Telehealth: Payer: Self-pay

## 2014-12-21 ENCOUNTER — Other Ambulatory Visit: Payer: Self-pay

## 2014-12-21 NOTE — Telephone Encounter (Signed)
LM for pt tcb to see if he has set up an appointment with Psychiatry.

## 2014-12-21 NOTE — Telephone Encounter (Signed)
Refill request for Alprazolam 0.25mg , ok to refill?

## 2014-12-21 NOTE — Telephone Encounter (Signed)
You may give #20 but further refills need to come through psychiatry. Is he set up yet? He was supposed to schedule after last visit

## 2014-12-31 ENCOUNTER — Telehealth: Payer: Self-pay | Admitting: Family Medicine

## 2014-12-31 NOTE — Telephone Encounter (Signed)
[facility] [phone]  Requesting refill of ALPRAZolam (XANAX) 0.25 MG tablet

## 2014-12-31 NOTE — Telephone Encounter (Signed)
Can you update me first on status of last phone call please

## 2014-12-31 NOTE — Telephone Encounter (Signed)
Refill ok? 

## 2014-12-31 NOTE — Telephone Encounter (Signed)
Called and left another message on pt vm tcb.

## 2015-01-01 ENCOUNTER — Encounter: Payer: Self-pay | Admitting: Endocrinology

## 2015-01-01 ENCOUNTER — Other Ambulatory Visit (INDEPENDENT_AMBULATORY_CARE_PROVIDER_SITE_OTHER): Payer: BLUE CROSS/BLUE SHIELD

## 2015-01-01 ENCOUNTER — Ambulatory Visit (INDEPENDENT_AMBULATORY_CARE_PROVIDER_SITE_OTHER): Payer: BLUE CROSS/BLUE SHIELD | Admitting: Endocrinology

## 2015-01-01 VITALS — BP 134/87 | HR 80 | Temp 97.8°F | Ht 74.0 in | Wt 290.0 lb

## 2015-01-01 DIAGNOSIS — E890 Postprocedural hypothyroidism: Secondary | ICD-10-CM

## 2015-01-01 DIAGNOSIS — E559 Vitamin D deficiency, unspecified: Secondary | ICD-10-CM | POA: Diagnosis not present

## 2015-01-01 LAB — VITAMIN D 25 HYDROXY (VIT D DEFICIENCY, FRACTURES): VITD: 26.72 ng/mL — ABNORMAL LOW (ref 30.00–100.00)

## 2015-01-01 LAB — TSH: TSH: 2.25 u[IU]/mL (ref 0.35–4.50)

## 2015-01-01 MED ORDER — VITAMIN D (ERGOCALCIFEROL) 1.25 MG (50000 UNIT) PO CAPS: 50000.0000 [IU] | ORAL_CAPSULE | ORAL | Status: DC

## 2015-01-01 NOTE — Patient Instructions (Addendum)
blood tests are requested for you today.  We'll let you know about the results.   You can stop taking the vitamin-D pill.  I would be happy to see you back here whenever you want.

## 2015-01-01 NOTE — Progress Notes (Signed)
Subjective:    Patient ID: Robert [name], male    DOB: 03-04-1976, 39 y.o.   MRN: [id number]  HPI Pt returns for f/u mild of postsurgical hypothyroidism (in 2015, pt had right thyroid lobectomy for a suspicious nodule; pathol was benign).  He takes synthroid as rx'ed.  He has weight gain.  pt also returns for f/u of vitamin-D deficiency (dx'ed 2015; in 2016 he his first-ever episode of urolithiasis (but the one stone was lost, so he has never had stone analysis)).  Denies muscle cramps.  He still takes ergocalciferol. Past Medical History  Diagnosis Date  . Chronic sinusitis   . ADD (attention deficit disorder) without hyperactivity     according to his mother/ work test possible  . Sleep apnea   . Headache   . Kidney stones     Past Surgical History  Procedure Laterality Date  . Nasal sinus surgery    . Hernia repair      pt. was an infant when this surgery occured  . Thyroid surgery Right 02/28/2014    DR TEOH    PARTIAL THYROIDECTOMY  . Thyroidectomy Right 02/28/2014    Procedure: RIGHT HEMI THYROIDECTOMY;  Surgeon: Ascencion Dike, MD;  Location: [facility];  Service: ENT;  Laterality: Right;    Social History   Social History  . Marital Status: Single    Spouse Name: N/A  . Number of Children: N/A  . Years of Education: N/A   Occupational History  . Not on file.   Social History Main Topics  . Smoking status: Never Smoker   . Smokeless tobacco: Never Used  . Alcohol Use: Yes     Comment: social  . Drug Use: No  . Sexual Activity: Yes   Other Topics Concern  . Not on file   Social History Narrative   Single but with live in girlfriend. No kids.       Works time [employer] of 1 pets dog and cats.    Current Outpatient Prescriptions on File Prior to Visit  Medication Sig Dispense Refill  . ALPRAZolam (XANAX) 0.25 MG tablet TAKE 1 TABLET BY MOUTH THREE TIMES DAILY AS NEEDED FOR ANXIETY 60 tablet 1  . benzonatate (TESSALON) 100 MG capsule Take 1-2 capsules  (100-200 mg total) by mouth 3 (three) times daily as needed for cough. 40 capsule 0  . cyclobenzaprine (FLEXERIL) 10 MG tablet Take 1 tablet (10 mg total) by mouth 2 (two) times daily as needed for muscle spasms. 20 tablet 0  . Dexmethylphenidate HCl 40 MG CP24 Take 1 capsule (40 mg total) by mouth daily. Generic please. May fill 10/12/13 30 capsule 0  . HYDROcodone-acetaminophen (NORCO) 5-325 MG per tablet Take 2 tablets by mouth every 4 (four) hours as needed. 10 tablet 0  . ipratropium (ATROVENT) 0.03 % nasal spray Place 2 sprays into both nostrils 2 (two) times daily. 30 mL 0  . levothyroxine (SYNTHROID, LEVOTHROID) 75 MCG tablet Take 1 tablet (75 mcg total) by mouth daily before breakfast. 90 tablet 1  . Triamcinolone Acetonide (TRIAMCINOLONE 0.1 % CREAM : EUCERIN) CREA Apply 1 application topically 2 (two) times daily. For 2 weeks 60 each 0   No current facility-administered medications on file prior to visit.    Allergies  Allergen Reactions  . Penicillins Anaphylaxis  . Milk-Related Compounds Other (See Comments)    GI issues   . Topamax Other (See Comments)    Is allergic when taking while  drinking alcohol    Family History  Problem Relation Age of Onset  . Hyperlipidemia Mother   . Hypertension Mother   . Thyroid disease Neg Hx     BP 134/87 mmHg  Pulse 80  Temp(Src) 97.8 F (36.6 C) (Oral)  Ht 6\' 2"  (1.88 m)  Wt 290 lb (131.543 kg)  BMI 37.22 kg/m2  SpO2 98%   Review of Systems Denies numbness.    Objective:   Physical Exam VITAL SIGNS:  See vs page GENERAL: no distress Neck: a healed scar is present.  i do not appreciate a nodule in the thyroid or elsewhere in the neck Skin: not diaphoretic Ext: no edema Neuro: no tremor.    25-OH vit-D=27 Lab Results  Component Value Date   TSH 2.25 01/01/2015   T4TOTAL 7.3 08/18/2006      Assessment & Plan:  Vit-D deficiency: persistent despite supplementation Postsurgical hypothyroidism,  well-replaced.  Patient is advised the following: Patient Instructions  blood tests are requested for you today.  We'll let you know about the results.   You can stop taking the vitamin-D pill.  I would be happy to see you back here whenever you want.    addendum: have sent a prescription to your pharmacy, to take ergocalciferol for 10 more weeks.

## 2015-01-02 LAB — PTH, INTACT AND CALCIUM
Calcium: 9.2 mg/dL (ref 8.4–10.5)
PTH: 16 pg/mL (ref 14–64)

## 2015-01-09 ENCOUNTER — Ambulatory Visit (INDEPENDENT_AMBULATORY_CARE_PROVIDER_SITE_OTHER): Payer: BLUE CROSS/BLUE SHIELD | Admitting: Family Medicine

## 2015-01-09 ENCOUNTER — Other Ambulatory Visit (INDEPENDENT_AMBULATORY_CARE_PROVIDER_SITE_OTHER): Payer: BLUE CROSS/BLUE SHIELD

## 2015-01-09 ENCOUNTER — Encounter: Payer: Self-pay | Admitting: Family Medicine

## 2015-01-09 VITALS — BP 130/88 | HR 60 | Ht 75.0 in | Wt 296.0 lb

## 2015-01-09 DIAGNOSIS — S63639A Sprain of interphalangeal joint of unspecified finger, initial encounter: Secondary | ICD-10-CM

## 2015-01-09 DIAGNOSIS — E559 Vitamin D deficiency, unspecified: Secondary | ICD-10-CM

## 2015-01-09 DIAGNOSIS — M79642 Pain in left hand: Secondary | ICD-10-CM

## 2015-01-09 MED ORDER — VITAMIN D (ERGOCALCIFEROL) 1.25 MG (50000 UNIT) PO CAPS: ORAL_CAPSULE | ORAL | Status: DC

## 2015-01-09 NOTE — Patient Instructions (Signed)
Good to see you Volar plate avulsion fracture.  Wear brace as much as you can. Day and night Ice if it hurts Increase vitamin D 50000 IU 2 times a week.  See me again in 3 weeks

## 2015-01-09 NOTE — Assessment & Plan Note (Addendum)
Put in a ulnar gutter spint for now, increase vitamin D and will avoid too much exercise RTC in 3 weeks. We'll re-ultrasound the area to make sure healing is appropriate.

## 2015-01-09 NOTE — Progress Notes (Signed)
Pre visit review using our clinic review tool, if applicable. No additional management support is needed unless otherwise documented below in the visit note. 
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
## 2015-01-09 NOTE — Assessment & Plan Note (Signed)
Increase to 2 times a week.

## 2015-01-18 ENCOUNTER — Encounter: Payer: Self-pay | Admitting: Family Medicine

## 2015-01-21 ENCOUNTER — Other Ambulatory Visit: Payer: Self-pay

## 2015-01-21 MED ORDER — LEVOTHYROXINE SODIUM 75 MCG PO TABS: 75.0000 ug | ORAL_TABLET | Freq: Every day | ORAL | Status: DC

## 2015-01-31 ENCOUNTER — Encounter: Payer: Self-pay | Admitting: Family Medicine

## 2015-01-31 ENCOUNTER — Other Ambulatory Visit (INDEPENDENT_AMBULATORY_CARE_PROVIDER_SITE_OTHER): Payer: BLUE CROSS/BLUE SHIELD

## 2015-01-31 ENCOUNTER — Ambulatory Visit (INDEPENDENT_AMBULATORY_CARE_PROVIDER_SITE_OTHER): Payer: BLUE CROSS/BLUE SHIELD | Admitting: Family Medicine

## 2015-01-31 VITALS — BP 132/84 | HR 84 | Ht 75.0 in | Wt 296.0 lb

## 2015-01-31 DIAGNOSIS — S63639D Sprain of interphalangeal joint of unspecified finger, subsequent encounter: Secondary | ICD-10-CM

## 2015-01-31 DIAGNOSIS — M79642 Pain in left hand: Secondary | ICD-10-CM | POA: Diagnosis not present

## 2015-01-31 NOTE — Assessment & Plan Note (Signed)
Even with patient being noncompliant I think patient is healing appropriately. Seems to be approximately 85% healed on ultrasound. We discussed with patient about possibly bracing when needed, icing still as well as finishing the vitamin D supplementation. Patient will come back and see me again in 6 weeks to make her completely healed. We'll complexity of her if any worsening symptoms.

## 2015-01-31 NOTE — Patient Instructions (Signed)
Great to see you You are doing well.

## 2015-01-31 NOTE — Progress Notes (Signed)
Pre visit review using our clinic review tool, if applicable. No additional management support is needed unless otherwise documented below in the visit note. 

## 2015-01-31 NOTE — Progress Notes (Signed)
[facility] [address], [place] [address] Phone: [phone] Subjective:    I'm seeing this patient by the request  of:  Garret Reddish, MD   CC: Left hand pain f/u   [id number] Robert [name] is a 39 y.o. male coming in with complaint of left hand injury and pain. Patient was seen before and did have a volar plate fracture of the fifth metacarpal bone. Patient was put in a brace, icing regimen, topical anti-inflammatories. Patient states he is approximately 50-70% better. Some days he has no pain at all and other days it has more of a dull throbbing aching sensation. Patient did take the vitamin D as well as the K2. Mild GI distress. Patient states though that he is able to do daily activities. Stop wearing the brace because it was uncomfortable.  Past Medical History  Diagnosis Date  . Chronic sinusitis   . ADD (attention deficit disorder) without hyperactivity     according to his mother/ work test possible  . Sleep apnea   . Headache   . Kidney stones    Past Surgical History  Procedure Laterality Date  . Nasal sinus surgery    . Hernia repair      pt. was an infant when this surgery occured  . Thyroid surgery Right 02/28/2014    DR TEOH    PARTIAL THYROIDECTOMY  . Thyroidectomy Right 02/28/2014    Procedure: RIGHT HEMI THYROIDECTOMY;  Surgeon: Ascencion Dike, MD;  Location: [facility] OR;  Service: ENT;  Laterality: Right;   Social History  Substance Use Topics  . Smoking status: Never Smoker   . Smokeless tobacco: Never Used  . Alcohol Use: Yes     Comment: social   Allergies  Allergen Reactions  . Penicillins Anaphylaxis  . Milk-Related Compounds Other (See Comments)    GI issues   . Topamax Other (See Comments)    Is allergic when taking while drinking alcohol   Family History  Problem Relation Age of Onset  . Hyperlipidemia Mother   . Hypertension Mother   . Thyroid disease Neg Hx         Past medical history, social,  surgical and family history all reviewed in electronic medical record.   Review of Systems: No headache, visual changes, nausea, vomiting, diarrhea, constipation, dizziness, abdominal pain, skin rash, fevers, chills, night sweats, weight loss, swollen lymph nodes, body aches, joint swelling, muscle aches, chest pain, shortness of breath, mood changes.   Objective Blood pressure 132/84, pulse 84, height 6\' 3"  (1.905 m), weight 296 lb (134.265 kg), SpO2 96 %.  General: No apparent distress alert and oriented x3 mood and affect normal, dressed appropriately.  HEENT: Pupils equal, extraocular movements intact  Respiratory: Patient's speak in full sentences and does not appear short of breath  Cardiovascular: No lower extremity edema, non tender, no erythema  Skin: Warm dry intact with no signs of infection or rash on extremities or on axial skeleton.  Abdomen: Soft nontender  Neuro: Cranial nerves II through XII are intact, neurovascularly intact in all extremities with 2+ DTRs and 2+ pulses.  Lymph: No lymphadenopathy of posterior or anterior cervical chain or axillae bilaterally.  Gait normal with good balance and coordination.  MSK:  Non tender with full range of motion and good stability and symmetric strength and tone of shoulders, elbows, wrist, hip, knee and ankles bilaterally.  Left hand exam shows the patient's fifth metacarsal is only minimally tender at  this time we'll see him the lateral aspect. Patient does have hypermobility but this is symmetric compared to the contralateral side. No erythema or swelling noted. Neurovascular intact distally with full grip strength  Contralateral hand unremarkable.  Limited Musculoskeletal ultrasound was performed and interpreted by Hulan Saas, M Limited ultrasound of the fifth mecarpal shows the patient does fracture that was noted before is well-healed. Callus formation is noted. Reproduction of restarting. Mild increase in Doppler  flow.  Impression: Healed or near healed avulsion fracture of the fifth MCP.   Impression and Recommendations:     This case required medical decision making of moderate complexity.

## 2015-02-27 ENCOUNTER — Encounter: Payer: Self-pay | Admitting: Family Medicine

## 2015-02-27 ENCOUNTER — Ambulatory Visit (INDEPENDENT_AMBULATORY_CARE_PROVIDER_SITE_OTHER): Payer: BLUE CROSS/BLUE SHIELD | Admitting: Family Medicine

## 2015-02-27 VITALS — BP 122/74 | Temp 98.4°F | Wt 303.0 lb

## 2015-02-27 DIAGNOSIS — B079 Viral wart, unspecified: Secondary | ICD-10-CM | POA: Diagnosis not present

## 2015-02-27 DIAGNOSIS — E890 Postprocedural hypothyroidism: Secondary | ICD-10-CM

## 2015-02-27 MED ORDER — LEVOTHYROXINE SODIUM 88 MCG PO TABS: 88.0000 ug | ORAL_TABLET | Freq: Every day | ORAL | Status: DC

## 2015-02-27 NOTE — Progress Notes (Signed)
Garret Reddish, MD  Subjective:  Robert [name] is a 39 y.o. year old very pleasant male patient who presents for/with See problem oriented charting ROS-No hair or nail changes. No heat/cold intolerance. No constipation or diarrhea. Denies shakiness or anxiety. Gained 7 lb in the last month. Endorses fatigue. Wart as noted below  Past Medical History-  Patient Active Problem List   Diagnosis Date Noted  . Depression, major ([place]) 09/27/2014    Priority: Medium  . 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 Anxiety state 01/11/2014    Priority: Medium  . Morbid obesity ([place]) 01/11/2014    Priority: Medium  . ADHD (attention deficit hyperactivity disorder) 06/02/2011    Priority: Medium  . Erectile dysfunction 09/27/2014    Priority: Low  . Vitamin D deficiency 07/23/2014    Priority: Low  . S/P partial thyroidectomy 02/28/2014    Priority: Low  . Cyst of mandible 12/22/2013    Priority: Low  . Wart 02/27/2015  . Volar plate injury of finger 01/09/2015  . Rash 09/27/2014    Medications- reviewed and updated Current Outpatient Prescriptions  Medication Sig Dispense Refill  . ALPRAZolam (XANAX) 0.25 MG tablet TAKE 1 TABLET BY MOUTH THREE TIMES DAILY AS NEEDED FOR ANXIETY 60 tablet 1  . Dexmethylphenidate HCl 40 MG CP24 Take 1 capsule (40 mg total) by mouth daily. Generic please. May fill 10/12/13 30 capsule 0  . ipratropium (ATROVENT) 0.03 % nasal spray Place 2 sprays into both nostrils 2 (two) times daily. 30 mL 0  . levothyroxine (SYNTHROID, LEVOTHROID) 88 MCG tablet Take 1 tablet (88 mcg total) by mouth daily before breakfast. 90 tablet 3  . Vitamin D, Ergocalciferol, (DRISDOL) 50000 UNITS CAPS capsule 1 tab 2 times a week. 24 capsule 0  . cyclobenzaprine (FLEXERIL) 10 MG tablet Take 1 tablet (10 mg total) by mouth 2 (two) times daily as needed for muscle spasms. (Patient not taking: Reported on 02/27/2015) 20 tablet 0  . Triamcinolone Acetonide  (TRIAMCINOLONE 0.1 % CREAM : EUCERIN) CREA Apply 1 application topically 2 (two) times daily. For 2 weeks (Patient not taking: Reported on 02/27/2015) 60 each 0   No current facility-administered medications for this visit.    Objective: BP 122/74 mmHg  Temp(Src) 98.4 F (36.9 C)  Wt 303 lb (137.44 kg) Gen: NAD, resting comfortably CV: RRR no murmurs rubs or gallops Lungs: CTAB no crackles, wheeze, rhonchi Ext: no edema Skin: warm, dry, see below Neuro: grossly normal, moves all extremities Attention issues persist (titrating up on strattera    Assessment/Plan:  Wart S: wart on right 4th finger palmar surface for 4-5 months. History of several warts on hands responded well to cryotherapy. Requests treatment today O: 3 mm wart on palmar surface R 4th finger A/P: Cryotherapy with 3 freeze thaw cycles of 5-10 seconds was completed. Final cycle was 10 seconds. Aftercare verbally discussed and follow up in 4 weeks if recurs.    Post-surgical hypothyroidism S: complains of continued issues with fatigue that were better on higher dose of levothyroxine (at one point was taking 50 plus 75 mcg).  A/P: TSH has been controlled but >2 in recent checks. We will increase to 88 mcg and see if we can get <2 but keep in normal range. Hopefully this will help with his symptoms. If not, we discussed repeating testosterone per his request.    Repeat TSH 6-[redacted] weeks along with vitamin D Return precautions advised.   Meds ordered this encounter  Medications  . DISCONTD: levothyroxine (SYNTHROID, LEVOTHROID) 88 MCG tablet    Sig: Take 1 tablet (88 mcg total) by mouth daily before breakfast.    Dispense:  30 tablet    Refill:  5  . levothyroxine (SYNTHROID, LEVOTHROID) 88 MCG tablet    Sig: Take 1 tablet (88 mcg total) by mouth daily before breakfast.    Dispense:  90 tablet    Refill:  3

## 2015-02-27 NOTE — Assessment & Plan Note (Signed)
S: complains of continued issues with fatigue that were better on higher dose of levothyroxine (at one point was taking 50 plus 75 mcg).  A/P: TSH has been controlled but >2 in recent checks. We will increase to 88 mcg and see if we can get <2 but keep in normal range. Hopefully this will help with his symptoms. If not, we discussed repeating testosterone per his request.

## 2015-02-27 NOTE — Patient Instructions (Signed)
Let's increase thyroid medicine given your fatigue to 88 mcg. Return for vitamin D and TSH testing in 6-8 weeks.   Hold off on testosterone testing for now  For wart- if recurs return in about 4 weeks

## 2015-02-27 NOTE — Assessment & Plan Note (Signed)
S: wart on right 4th finger palmar surface for 4-5 months. History of several warts on hands responded well to cryotherapy. Requests treatment today O: 3 mm wart on palmar surface R 4th finger A/P: Cryotherapy with 3 freeze thaw cycles of 5-10 seconds was completed. Final cycle was 10 seconds. Aftercare verbally discussed and follow up in 4 weeks if recurs.

## 2015-03-13 ENCOUNTER — Telehealth: Payer: Self-pay | Admitting: Family Medicine

## 2015-03-13 NOTE — Telephone Encounter (Signed)
Used a 1pm sda slot for 03/15/15

## 2015-03-14 ENCOUNTER — Ambulatory Visit: Payer: Self-pay | Admitting: Family Medicine

## 2015-03-15 ENCOUNTER — Ambulatory Visit (INDEPENDENT_AMBULATORY_CARE_PROVIDER_SITE_OTHER): Payer: BLUE CROSS/BLUE SHIELD | Admitting: Family Medicine

## 2015-03-15 ENCOUNTER — Encounter: Payer: Self-pay | Admitting: Family Medicine

## 2015-03-15 VITALS — BP 126/72 | HR 84 | Temp 98.9°F | Wt 303.0 lb

## 2015-03-15 DIAGNOSIS — G43109 Migraine with aura, not intractable, without status migrainosus: Secondary | ICD-10-CM

## 2015-03-15 MED ORDER — SUMATRIPTAN SUCCINATE 50 MG PO TABS: 50.0000 mg | ORAL_TABLET | ORAL | Status: DC | PRN

## 2015-03-15 NOTE — Progress Notes (Signed)
Garret Reddish, MD  Subjective:  Robert [name] is a 39 y.o. year old very pleasant male patient who presents for/with See problem oriented charting ROS- No facial or extremity weakness. No slurred words or trouble swallowing. no double vision. No paresthesias. No confusion or word finding difficulties.    Past Medical History-  Patient Active Problem List   Diagnosis Date Noted  . Depression, major ([place]) 09/27/2014    Priority: Medium  . 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 Anxiety state 01/11/2014    Priority: Medium  . Morbid obesity ([place]) 01/11/2014    Priority: Medium  . ADHD (attention deficit hyperactivity disorder) 06/02/2011    Priority: Medium  . Erectile dysfunction 09/27/2014    Priority: Low  . Vitamin D deficiency 07/23/2014    Priority: Low  . S/P partial thyroidectomy 02/28/2014    Priority: Low  . Cyst of mandible 12/22/2013    Priority: Low  . Wart 02/27/2015  . Volar plate injury of finger 01/09/2015  . Rash 09/27/2014    Medications- reviewed and updated Current Outpatient Prescriptions  Medication Sig Dispense Refill  . Dexmethylphenidate HCl 40 MG CP24 Take 1 capsule (40 mg total) by mouth daily. Generic please. May fill 10/12/13 30 capsule 0  . ipratropium (ATROVENT) 0.03 % nasal spray Place 2 sprays into both nostrils 2 (two) times daily. 30 mL 0  . levothyroxine (SYNTHROID, LEVOTHROID) 88 MCG tablet Take 1 tablet (88 mcg total) by mouth daily before breakfast. 90 tablet 3  . Triamcinolone Acetonide (TRIAMCINOLONE 0.1 % CREAM : EUCERIN) CREA Apply 1 application topically 2 (two) times daily. For 2 weeks 60 each 0  . Vitamin D, Ergocalciferol, (DRISDOL) 50000 UNITS CAPS capsule 1 tab 2 times a week. 24 capsule 0  . ALPRAZolam (XANAX) 0.25 MG tablet TAKE 1 TABLET BY MOUTH THREE TIMES DAILY AS NEEDED FOR ANXIETY (Patient not taking: Reported on 03/15/2015) 60 tablet 1  . cyclobenzaprine (FLEXERIL) 10 MG tablet Take 1 tablet (10  mg total) by mouth 2 (two) times daily as needed for muscle spasms. (Patient not taking: Reported on 02/27/2015) 20 tablet 0  . SUMAtriptan (IMITREX) 50 MG tablet Take 1 tablet (50 mg total) by mouth every 2 (two) hours as needed for migraine. May repeat in 2 hours if headache persists or recurs. 10 tablet 3   No current facility-administered medications for this visit.    Objective: BP 126/72 mmHg  Pulse 84  Temp(Src) 98.9 F (37.2 C)  Wt 303 lb (137.44 kg) Gen: NAD, resting comfortably CV: RRR no murmurs rubs or gallops Lungs: CTAB no crackles, wheeze, rhonchi Abdomen: soft/nontender/nondistended/normal bowel sounds. No rebound or guarding.  Ext: no edema Skin: warm, dry Neuro: CN II-XII intact, sensation and reflexes normal throughout, 5/5 muscle strength in bilateral upper and lower extremities. Normal finger to nose. Normal rapid alternating movements. No pronator drift. Normal romberg. Normal gait.   Assessment/Plan:  Migraine Headache  S: Headaches behind his eyes since he was about 12 years ago. Usually gets a headache about every 6-9 months. On Tuesday night had to leave work with severe headache. Stabbing repetitive sensation behind right eye. Intermittent nausea. Sees spot in left visual field before many of episodes.  Super sensitive to sound and light. Thinks about twice a week for 2 months. Takes 800mg  advil and drinks caffeine and seems to help if hits it early. Sleep helps as well. Thinks this started before strattera. Has been getting 8 hours sleep, stress level  lower, and drinking at least 70 oz of water a day- no clear trigger here.  A/P: long term history of migraine like headaches but we will officially diagnose at this time with 5/5 pound mnemonic. We will trial sumatriptan as alternate to ibuprofen. Discussed daily medicine but patient wants to trial this first. I would lean toward beta blocker. Planned 2-3 month follow up or sooner if worsened. Concerning to have such  increase without clear trigger but normal neurological exam. Would consider referral to headache clinic as next step although would ask their opinion on MRI if frequency continues or worsens.   Also wonder if switch to strattera causing issues- he will discuss with psychiatry but we may have to trial off this.   Sooner  Return precautions advised.   Changed to send to local pharmacy Meds ordered this encounter  Medications  . DISCONTD: SUMAtriptan (IMITREX) 50 MG tablet    Sig: Take 1 tablet (50 mg total) by mouth every 2 (two) hours as needed for migraine. May repeat in 2 hours if headache persists or recurs.    Dispense:  10 tablet    Refill:  3  . SUMAtriptan (IMITREX) 50 MG tablet    Sig: Take 1 tablet (50 mg total) by mouth every 2 (two) hours as needed for migraine. May repeat in 2 hours if headache persists or recurs.    Dispense:  10 tablet    Refill:  3

## 2015-03-15 NOTE — Patient Instructions (Signed)
Migraines Sumatriptan at first sign of headache. May repeat in 2 hours if needed (2 pills max a day)  Journal frequency as well as any potential triggers  Follow up in 2-3 months. Consider daily medication if frequency has not improved or if worsens. Could also consider headache wellness center if continues to have more frequent trend

## 2015-04-02 ENCOUNTER — Other Ambulatory Visit: Payer: Self-pay | Admitting: Family Medicine

## 2015-04-02 NOTE — Telephone Encounter (Signed)
Pt was doing great at last OV & no longer needs this medicine. Refill denied.

## 2015-04-09 ENCOUNTER — Other Ambulatory Visit: Payer: Self-pay

## 2015-04-10 ENCOUNTER — Other Ambulatory Visit: Payer: Self-pay | Admitting: Family Medicine

## 2015-04-10 ENCOUNTER — Other Ambulatory Visit (INDEPENDENT_AMBULATORY_CARE_PROVIDER_SITE_OTHER): Payer: Managed Care, Other (non HMO)

## 2015-04-10 DIAGNOSIS — N529 Male erectile dysfunction, unspecified: Secondary | ICD-10-CM

## 2015-04-10 DIAGNOSIS — E559 Vitamin D deficiency, unspecified: Secondary | ICD-10-CM

## 2015-04-10 LAB — VITAMIN D 25 HYDROXY (VIT D DEFICIENCY, FRACTURES): VITD: 31.54 ng/mL (ref 30.00–100.00)

## 2015-04-10 LAB — TSH: TSH: 2.63 u[IU]/mL (ref 0.35–4.50)

## 2015-04-11 ENCOUNTER — Other Ambulatory Visit: Payer: Self-pay

## 2015-04-11 ENCOUNTER — Other Ambulatory Visit: Payer: Self-pay | Admitting: Family Medicine

## 2015-04-11 ENCOUNTER — Encounter: Payer: Self-pay | Admitting: Family Medicine

## 2015-04-11 DIAGNOSIS — E890 Postprocedural hypothyroidism: Secondary | ICD-10-CM

## 2015-04-11 MED ORDER — LEVOTHYROXINE SODIUM 100 MCG PO TABS: 100.0000 ug | ORAL_TABLET | Freq: Every day | ORAL | Status: DC

## 2015-04-11 NOTE — Telephone Encounter (Signed)
Im ok sending in 110mcg #90 and repeat TSH in 8 weeks. Thanks Keba. Please order and inform patient.

## 2015-04-15 ENCOUNTER — Ambulatory Visit (INDEPENDENT_AMBULATORY_CARE_PROVIDER_SITE_OTHER): Payer: Managed Care, Other (non HMO) | Admitting: Physician Assistant

## 2015-04-15 VITALS — BP 126/80 | HR 84 | Temp 97.7°F | Resp 20 | Ht 74.0 in | Wt 306.2 lb

## 2015-04-15 DIAGNOSIS — D72820 Lymphocytosis (symptomatic): Secondary | ICD-10-CM | POA: Diagnosis not present

## 2015-04-15 DIAGNOSIS — R05 Cough: Secondary | ICD-10-CM | POA: Diagnosis not present

## 2015-04-15 DIAGNOSIS — R058 Other specified cough: Secondary | ICD-10-CM

## 2015-04-15 DIAGNOSIS — R059 Cough, unspecified: Secondary | ICD-10-CM

## 2015-04-15 LAB — TESTOSTERONE TOTAL,FREE,BIO, MALES
Albumin: 4.4 g/dL (ref 3.6–5.1)
Sex Hormone Binding: 15 nmol/L (ref 10–50)
Testosterone, Bioavailable: 153.3 ng/dL (ref 130.5–681.7)
Testosterone, Free: 76.1 pg/mL (ref 47.0–244.0)
Testosterone: 334 ng/dL (ref 250–827)

## 2015-04-15 LAB — POCT CBC
Granulocyte percent: 47.7 %G (ref 37–80)
HCT, POC: 46.4 % (ref 43.5–53.7)
Hemoglobin: 16.1 g/dL (ref 14.1–18.1)
Lymph, poc: 4 — AB (ref 0.6–3.4)
MCH, POC: 29.7 pg (ref 27–31.2)
MCHC: 34.7 g/dL (ref 31.8–35.4)
MCV: 85.4 fL (ref 80–97)
MID (cbc): 1.2 — AB (ref 0–0.9)
MPV: 7.1 fL (ref 0–99.8)
POC Granulocyte: 4.7 (ref 2–6.9)
POC LYMPH PERCENT: 40.6 %L (ref 10–50)
POC MID %: 11.7 %M (ref 0–12)
Platelet Count, POC: 337 10*3/uL (ref 142–424)
RBC: 5.43 M/uL (ref 4.69–6.13)
RDW, POC: 13 %
WBC: 9.9 10*3/uL (ref 4.6–10.2)

## 2015-04-15 LAB — POCT GLYCOSYLATED HEMOGLOBIN (HGB A1C): Hemoglobin A1C: 5.4

## 2015-04-15 LAB — GLUCOSE, POCT (MANUAL RESULT ENTRY): POC Glucose: 89 mg/dl (ref 70–99)

## 2015-04-15 MED ORDER — ALBUTEROL SULFATE (2.5 MG/3ML) 0.083% IN NEBU
2.5000 mg | INHALATION_SOLUTION | Freq: Once | RESPIRATORY_TRACT | Status: AC
  Administered 2015-04-15: 2.5 mg via RESPIRATORY_TRACT

## 2015-04-15 MED ORDER — IPRATROPIUM BROMIDE 0.02 % IN SOLN
0.5000 mg | Freq: Once | RESPIRATORY_TRACT | Status: AC
  Administered 2015-04-15: 0.5 mg via RESPIRATORY_TRACT

## 2015-04-15 MED ORDER — HYDROCODONE-HOMATROPINE 5-1.5 MG/5ML PO SYRP: 2.5000 mL | ORAL_SOLUTION | Freq: Every evening | ORAL | Status: DC | PRN

## 2015-04-15 MED ORDER — PREDNISONE 20 MG PO TABS: ORAL_TABLET | ORAL | Status: AC

## 2015-04-15 NOTE — Progress Notes (Signed)
04/15/2015 6:40 PM   DOB: [birth year] / MRN: [id number]  SUBJECTIVE:  Robert [name] is a 40 y.o. male presenting for for the evaluation of cough that started 2 weeks ago.  Associated symptoms include sore throat today and he denies runny nose, congestion, fever and difficulty breathing. Treatments tried thus far include Tessalon with poor relief. He reports sick contacts. No double sickening.  No history of asthma, and he is a never smoker.  No GERD.   He is allergic to penicillins; milk-related compounds; and topamax.   He  has a past medical history of Chronic sinusitis; ADD (attention deficit disorder) without hyperactivity; Sleep apnea; Headache; and Kidney stones.    He  reports that he has never smoked. He has never used smokeless tobacco. He reports that he drinks alcohol. He reports that he does not use illicit drugs. He  reports that he currently engages in sexual activity. The patient  has past surgical history that includes Nasal sinus surgery; Hernia repair; Thyroid surgery (Right, 02/28/2014); and Thyroidectomy (Right, 02/28/2014).  His family history includes Hyperlipidemia in his mother; Hypertension in his mother. There is no history of Thyroid disease.  Review of Systems  Constitutional: Negative for fever, chills and diaphoresis.  Respiratory: Positive for cough and sputum production. Negative for hemoptysis.   Cardiovascular: Negative for chest pain.  Genitourinary: Negative.   Musculoskeletal: Positive for myalgias (thoracic, only when coughing).  Skin: Negative for rash.  Neurological: Positive for headaches (associated with cough). Negative for dizziness.    Problem list and medications reviewed and updated by myself where necessary, and exist elsewhere in the encounter.   OBJECTIVE:  BP 126/80 mmHg  Pulse 84  Temp(Src) 97.7 F (36.5 C) (Oral)  Resp 20  Ht 6\' 2"  (1.88 m)  Wt 306 lb 3.2 oz (138.891 kg)  BMI 39.30 kg/m2  SpO2 98%  Physical Exam    Constitutional: He is oriented to person, place, and time. He appears well-developed. He does not appear ill.  Eyes: Conjunctivae and EOM are normal. Pupils are equal, round, and reactive to light.  Cardiovascular: Normal rate.   Pulmonary/Chest: Effort normal and breath sounds normal.  Abdominal: He exhibits no distension.  Musculoskeletal: Normal range of motion.  Neurological: He is alert and oriented to person, place, and time. No cranial nerve deficit. Coordination normal.  Skin: Skin is warm and dry. He is not diaphoretic.  Psychiatric: He has a normal mood and affect.  Nursing note and vitals reviewed.   Results for orders placed or performed in visit on 04/15/15 (from the past 48 hour(s))  POCT CBC     Status: Abnormal   Collection Time: 04/15/15  6:32 PM  Result Value Ref Range   WBC 9.9 4.6 - 10.2 K/uL   Lymph, poc 4.0 (A) 0.6 - 3.4   POC LYMPH PERCENT 40.6 10 - 50 %L   MID (cbc) 1.2 (A) 0 - 0.9   POC MID % 11.7 0 - 12 %M   POC Granulocyte 4.7 2 - 6.9   Granulocyte percent 47.7 37 - 80 %G   RBC 5.43 4.69 - 6.13 M/uL   Hemoglobin 16.1 14.1 - 18.1 g/dL   HCT, POC 46.4 43.5 - 53.7 %   MCV 85.4 80 - 97 fL   MCH, POC 29.7 27 - 31.2 pg   MCHC 34.7 31.8 - 35.4 g/dL   RDW, POC 13.0 %   Platelet Count, POC 337 142 - 424 K/uL   MPV 7.1 0 -  99.8 fL  POCT glycosylated hemoglobin (Hb A1C)     Status: None   Collection Time: 04/15/15  6:32 PM  Result Value Ref Range   Hemoglobin A1C 5.4   POCT glucose (manual entry)     Status: None   Collection Time: 04/15/15  6:32 PM  Result Value Ref Range   POC Glucose 89 70 - 99 mg/dl    ASSESSMENT AND PLAN:  [place] was seen today for cough and sore throat.  Diagnoses and all orders for this visit:  Cough: No evidence of bacterial infiltration.  No evidence of GI erosion that would lend to LPR. Lymphocytosis suggest viral uri.  Will treat to reduce inflammation.     -     POCT CBC -     albuterol (PROVENTIL) (2.5 MG/3ML) 0.083%  nebulizer solution 2.5 mg; Take 3 mLs (2.5 mg total) by nebulization once. -     ipratropium (ATROVENT) nebulizer solution 0.5 mg; Take 2.5 mLs (0.5 mg total) by nebulization once.  Morbid obesity due to excess calories (H[facility]) -     POCT glycosylated hemoglobin (Hb A1C) -     POCT glucose (manual entry)  Lymphocytosis  Post-viral cough syndrome    The patient was advised to call or return to clinic if he does not see an improvement in symptoms or to seek the care of the closest emergency department if he worsens with the above plan.   Philis Fendt, MHS, PA-C [facility] 04/15/2015 6:40 PM

## 2015-06-10 ENCOUNTER — Other Ambulatory Visit (INDEPENDENT_AMBULATORY_CARE_PROVIDER_SITE_OTHER): Payer: Managed Care, Other (non HMO)

## 2015-06-10 DIAGNOSIS — E890 Postprocedural hypothyroidism: Secondary | ICD-10-CM

## 2015-06-10 LAB — TSH: TSH: 2.21 u[IU]/mL (ref 0.35–4.50)

## 2015-06-11 ENCOUNTER — Encounter: Payer: Self-pay | Admitting: Family Medicine

## 2015-06-11 ENCOUNTER — Other Ambulatory Visit: Payer: Self-pay

## 2015-06-11 MED ORDER — LEVOTHYROXINE SODIUM 112 MCG PO TABS: 112.0000 ug | ORAL_TABLET | Freq: Every day | ORAL | Status: DC

## 2015-06-11 NOTE — Telephone Encounter (Signed)
Keba- I am not a big fan of the armour thyroid. I would be willing to go up to 112 mcg levothyroxine then repeat in 6 weeks. You can send this to patient and send in medicine

## 2015-06-20 ENCOUNTER — Encounter: Payer: Self-pay | Admitting: Family Medicine

## 2015-06-20 ENCOUNTER — Other Ambulatory Visit: Payer: Self-pay | Admitting: Family Medicine

## 2015-06-20 MED ORDER — LEVOTHYROXINE SODIUM 112 MCG PO TABS: 112.0000 ug | ORAL_TABLET | Freq: Every day | ORAL | Status: DC

## 2015-06-20 NOTE — Telephone Encounter (Signed)
Rx sent to [facility]

## 2015-06-23 ENCOUNTER — Ambulatory Visit (INDEPENDENT_AMBULATORY_CARE_PROVIDER_SITE_OTHER): Payer: Managed Care, Other (non HMO) | Admitting: Family Medicine

## 2015-06-23 VITALS — BP 132/78 | HR 92 | Temp 98.8°F | Resp 18 | Ht 74.0 in | Wt 307.0 lb

## 2015-06-23 DIAGNOSIS — J209 Acute bronchitis, unspecified: Secondary | ICD-10-CM | POA: Diagnosis not present

## 2015-06-23 DIAGNOSIS — R062 Wheezing: Secondary | ICD-10-CM | POA: Diagnosis not present

## 2015-06-23 DIAGNOSIS — R059 Cough, unspecified: Secondary | ICD-10-CM

## 2015-06-23 DIAGNOSIS — R05 Cough: Secondary | ICD-10-CM

## 2015-06-23 MED ORDER — AZITHROMYCIN 250 MG PO TABS: ORAL_TABLET | ORAL | Status: DC

## 2015-06-23 MED ORDER — HYDROCODONE-HOMATROPINE 5-1.5 MG/5ML PO SYRP: 5.0000 mL | ORAL_SOLUTION | Freq: Three times a day (TID) | ORAL | Status: DC | PRN

## 2015-06-23 NOTE — Patient Instructions (Addendum)

## 2015-06-23 NOTE — Progress Notes (Signed)
By signing my name below, I, Moises Blood, attest that this documentation has been prepared under the direction and in the presence of Robyn Haber, MD. Electronically Signed: Moises Blood, [place]. 06/23/2015 , 10:57 AM .  Patient was seen in room 9 .   Patient ID: Robert [name] MRN: [id number], DOB: March 26, 1976, 40 y.o. Date of Encounter: 06/23/2015  Primary Physician: Garret Reddish, MD  Chief Complaint:  Chief Complaint  Patient presents with  . Cough    x 2 days  . Sore Throat    HPI:  Robert [name] is a 40 y.o. male who presents to [facility] complaining of cough, sore throat and fatigue that started 2 days ago. He feels it in his chest now, describing it as "croakiness". He's had some productive coughs today. He reports having bronchitis about 2 months ago.   He starts a new job in 2 days, at [employer] in [employer].  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   Past Medical History  Diagnosis Date  . Chronic sinusitis   . ADD (attention deficit disorder) without hyperactivity     according to his mother/ work test possible  . Sleep apnea   . Headache   . Kidney stones      Home Meds: Prior to Admission medications   Medication Sig Start Date End Date Taking? Authorizing Provider  atomoxetine (STRATTERA) 100 MG capsule Take 100 mg by mouth daily.   Yes Historical Provider, MD  cyclobenzaprine (FLEXERIL) 10 MG tablet Take 1 tablet (10 mg total) by mouth 2 (two) times daily as needed for muscle spasms. 12/04/14  Yes Margarita Mail, PA-C  levothyroxine (SYNTHROID, LEVOTHROID) 112 MCG tablet Take 1 tablet (112 mcg total) by mouth daily. 06/20/15  Yes Marin Olp, MD  SUMAtriptan (IMITREX) 50 MG tablet Take 1 tablet (50 mg total) by mouth every 2 (two) hours as needed for migraine. May repeat in 2 hours if headache persists or recurs. 03/15/15  Yes Marin Olp, MD  ALPRAZolam Duanne Moron) 0.25 MG  tablet TAKE 1 TABLET BY MOUTH THREE TIMES DAILY AS NEEDED FOR ANXIETY Patient not taking: Reported on 06/23/2015 09/18/14   Marin Olp, MD    Allergies:  Allergies  Allergen Reactions  . Penicillins Anaphylaxis  . Milk-Related Compounds Other (See Comments)    GI issues   . Topamax Other (See Comments)    Is allergic when taking while drinking alcohol    Social History   Social History  . Marital Status: Single    Spouse Name: N/A  . Number of Children: N/A  . Years of Education: N/A   Occupational History  . Not on file.   Social History Main Topics  . Smoking status: Never Smoker   . Smokeless tobacco: Never Used  . Alcohol Use: Yes     Comment: social  . Drug Use: No  . Sexual Activity: Yes   Other Topics Concern  . Not on file   Social History Narrative   Single but with live in girlfriend. No kids.       Works time [employer] of 1 pets dog and cats.     Review of Systems: Constitutional: negative for fever, chills, night sweats, weight changes; positive for fatigue HEENT: negative for vision changes, hearing loss, rhinorrhea, epistaxis, or sinus pressure; positive for sore throat, congestion Cardiovascular: negative for chest pain or palpitations Respiratory: negative  for hemoptysis, wheezing, shortness of breath; positive for cough Abdominal: negative for abdominal pain, nausea, vomiting, diarrhea, or constipation Dermatological: negative for rash Neurologic: negative for headache, dizziness, or syncope All other systems reviewed and are otherwise negative with the exception to those above and in the HPI.  Physical Exam:  Blood pressure 132/78, pulse 92, temperature 98.8 F (37.1 C), resp. rate 18, height 6\' 2"  (1.88 m), weight 307 lb (139.254 kg), SpO2 96 %., Body mass index is 39.4 kg/(m^2). General: Well developed, well nourished, in no acute distress. Head: Normocephalic, atraumatic, eyes without discharge, sclera non-icteric, nares are without  discharge. Bilateral auditory canals clear, TM's are without perforation, pearly grey and translucent with reflective cone of light bilaterally. Oral cavity moist, posterior pharynx without exudate, erythema, peritonsillar abscess, or post nasal drip.  Neck: Supple. No thyromegaly. Full ROM. No lymphadenopathy. Lungs: Clear bilaterally to auscultation without rales, or rhonchi. Breathing is unlabored. wheezing bilaterally  Heart: RRR with S1 S2. No murmurs, rubs, or gallops appreciated. Abdomen: Soft, non-tender, non-distended with normoactive bowel sounds. No hepatomegaly. No rebound/guarding. No obvious abdominal masses. Msk:  Strength and tone normal for age. Extremities/Skin: Warm and dry. No clubbing or cyanosis. No edema. No rashes or suspicious lesions. Neuro: Alert and oriented X 3. Moves all extremities spontaneously. Gait is normal. CNII-XII grossly in tact. Psych:  Responds to questions appropriately with a normal affect.     ASSESSMENT AND PLAN:  40 y.o. year old male with Cough - Plan: HYDROcodone-homatropine (HYCODAN) 5-1.5 MG/5ML syrup, azithromycin (ZITHROMAX) 250 MG tablet  Wheezing - Plan: HYDROcodone-homatropine (HYCODAN) 5-1.5 MG/5ML syrup, azithromycin (ZITHROMAX) 250 MG tablet  Acute bronchitis, unspecified organism - Plan: HYDROcodone-homatropine (HYCODAN) 5-1.5 MG/5ML syrup, azithromycin (ZITHROMAX) 250 MG tablet  This chart was scribed in my presence and reviewed by me personally.     Signed, Robyn Haber, MD 06/23/2015 10:57 AM

## 2015-06-26 ENCOUNTER — Ambulatory Visit (INDEPENDENT_AMBULATORY_CARE_PROVIDER_SITE_OTHER): Payer: Managed Care, Other (non HMO) | Admitting: Family Medicine

## 2015-06-26 ENCOUNTER — Other Ambulatory Visit: Payer: Self-pay

## 2015-06-26 VITALS — BP 118/78 | HR 84 | Temp 98.0°F | Resp 12 | Ht 74.0 in | Wt 309.0 lb

## 2015-06-26 DIAGNOSIS — R5383 Other fatigue: Secondary | ICD-10-CM

## 2015-06-26 DIAGNOSIS — R61 Generalized hyperhidrosis: Secondary | ICD-10-CM

## 2015-06-26 DIAGNOSIS — R062 Wheezing: Secondary | ICD-10-CM

## 2015-06-26 LAB — POCT INFLUENZA A/B
Influenza A, POC: NEGATIVE
Influenza B, POC: NEGATIVE

## 2015-06-26 MED ORDER — ALBUTEROL SULFATE HFA 108 (90 BASE) MCG/ACT IN AERS: 2.0000 | INHALATION_SPRAY | RESPIRATORY_TRACT | Status: DC | PRN

## 2015-06-26 MED ORDER — IPRATROPIUM BROMIDE 0.02 % IN SOLN
0.5000 mg | Freq: Once | RESPIRATORY_TRACT | Status: AC
  Administered 2015-06-26: 0.5 mg via RESPIRATORY_TRACT

## 2015-06-26 MED ORDER — ALBUTEROL SULFATE (2.5 MG/3ML) 0.083% IN NEBU
2.5000 mg | INHALATION_SOLUTION | Freq: Once | RESPIRATORY_TRACT | Status: AC
  Administered 2015-06-26: 2.5 mg via RESPIRATORY_TRACT

## 2015-06-26 NOTE — Patient Instructions (Addendum)
Please use Albuterol inhaler every 3-4 hours while awake for today and tomorrow Mucinex to help thin secretions Rest and hydrate for next 48 hours Come back in if you are not better in 3-4 days or if you are worse    IF you received an x-ray today, you will receive an invoice from [facility]. Please contact [facility] at [phone] with questions or concerns regarding your invoice.   IF you received labwork today, you will receive an invoice from [facility]. Please contact [facility] at [phone] with questions or concerns regarding your invoice.   Our billing staff will not be able to assist you with questions regarding bills from these companies.  You will be contacted with the lab results as soon as they are available. The fastest way to get your results is to activate your My Chart account. Instructions are located on the last page of this paperwork. If you have not heard from [place] regarding the results in 2 weeks, please contact this office.

## 2015-06-26 NOTE — Progress Notes (Signed)
Subjective:    Patient ID: Robert [name], male    DOB: 1975/08/05, 40 y.o.   MRN: [id number]  HPI  This is a 40 yo male who presents today for follow up of chest congestion, wheeze. He was seen 3 days ago and diagnosed with bronchitis. He was treated with Zpack, hycodan. He has continued to have persistent cough and "chest rattling." Became diaphoretic this morning and vomited his breakfast. He took some ibuprofen (400 mg) earlier this morning and a dose of Zicam. He feels better now.   Past Medical History  Diagnosis Date  . Chronic sinusitis   . ADD (attention deficit disorder) without hyperactivity     according to his mother/ work test possible  . Sleep apnea   . Headache   . Kidney stones    Past Surgical History  Procedure Laterality Date  . Nasal sinus surgery    . Hernia repair      pt. was an infant when this surgery occured  . Thyroid surgery Right 02/28/2014    DR TEOH    PARTIAL THYROIDECTOMY  . Thyroidectomy Right 02/28/2014    Procedure: RIGHT HEMI THYROIDECTOMY;  Surgeon: Ascencion Dike, MD;  [facility]: [facility] OR;  Service: ENT;  Laterality: Right;   Family History  Problem Relation Age of Onset  . Hyperlipidemia Mother   . Hypertension Mother   . Thyroid disease Neg Hx    Social History  Substance Use Topics  . Smoking status: Never Smoker   . Smokeless tobacco: Never Used  . Alcohol Use: Yes     Comment: social     Review of Systems Feels winded, + chills, + wheeze, +nausea, + vomiting, + headache.     Objective:   Physical Exam  Constitutional: He is oriented to person, place, and time. He appears well-developed and well-nourished. No distress.  HENT:  Head: Normocephalic and atraumatic.  Right Ear: Tympanic membrane, external ear and ear canal normal.  Left Ear: Tympanic membrane, external ear and ear canal normal.  Nose: Mucosal edema and rhinorrhea present.  Mouth/Throat: Uvula is midline.  Post nasal drainage.   Cardiovascular: Normal rate,  regular rhythm and normal heart sounds.   Pulmonary/Chest: Effort normal. He has wheezes (exp, posterior mid).  Neurological: He is alert and oriented to person, place, and time.  Skin: Skin is warm and dry. He is not diaphoretic.  Psychiatric: He has a normal mood and affect. His behavior is normal. Judgment and thought content normal.  Vitals reviewed.  BP 118/78 mmHg  Pulse 84  Temp(Src) 98 F (36.7 C) (Oral)  Resp 12  Ht 6\' 2"  (1.88 m)  Wt 309 lb (140.161 kg)  BMI 39.66 kg/m2  SpO2 95% Wt Readings from Last 3 Encounters:  06/26/15 309 lb (140.161 kg)  06/23/15 307 lb (139.254 kg)  04/15/15 306 lb 3.2 oz (138.891 kg)   . albuterol  2.5 mg Nebulization Once  . ipratropium  0.5 mg Nebulization Once   Results for orders placed or performed in visit on 06/26/15  POCT Influenza A/B  Result Value Ref Range   Influenza A, POC Negative Negative   Influenza B, POC Negative Negative  Significant subjective and objective improvement with nebulizer treatment.     Assessment & Plan:  1. Wheeze - POCT Influenza A/B - ipratropium (ATROVENT) nebulizer solution 0.5 mg; Take 2.5 mLs (0.5 mg total) by nebulization once. - albuterol (PROVENTIL) (2.5 MG/3ML) 0.083% nebulizer solution 2.5 mg; Take 3 mLs (2.5 mg total)  by nebulization once. - albuterol (PROVENTIL HFA;VENTOLIN HFA) 108 (90 Base) MCG/ACT inhaler; Inhale 2 puffs into the lungs every 4 (four) hours as needed for wheezing or shortness of breath (cough, shortness of breath or wheezing.).  Dispense: 1 Inhaler; Refill: 1  2. Diaphoresis - POCT Influenza A/B  3. Other fatigue - POCT Influenza A/B  4. Acute upper respiratory infection - finish Zpack, supportive measures, albuterol inhaler q2-4 hours while awake for next 48 hours  - RTC precautions reviewed  Clarene Reamer, FNP-BC  [facility]  06/29/2015 11:18 AM

## 2015-07-29 ENCOUNTER — Encounter: Payer: Self-pay | Admitting: Family Medicine

## 2015-07-29 ENCOUNTER — Ambulatory Visit (INDEPENDENT_AMBULATORY_CARE_PROVIDER_SITE_OTHER): Payer: Managed Care, Other (non HMO) | Admitting: Family Medicine

## 2015-07-29 VITALS — BP 120/82 | HR 77 | Temp 98.4°F | Wt 313.0 lb

## 2015-07-29 DIAGNOSIS — E890 Postprocedural hypothyroidism: Secondary | ICD-10-CM

## 2015-07-29 NOTE — Assessment & Plan Note (Signed)
S: Patient states he has gained 50 pounds in the last 6 months. He is concerned this is due to his thyroid. He would like his TSH to be below 2 at least. He is also interested in [facility] and more advanced thyroid testing. Review of records show his weight is up 20 pounds from July 2016  To 07/29/2015. He also complains of some dry skin on hands and his nails seem to split if grow long. In regards to his personal efforts for weight loss-Working towards more paleo diet, no fried food, very little bread, as much water as possible. Sparing sweet tea. Walks twice a week for 30 minutes.  A/P: We will check a TSH, free T4, free T3. Discussed depending on findings would consider endocrinology referral. May just need to titrate up his levothyroxine from 112 mcg. I think there is certainly some lifestyle habits strongly contributing weight gain as well though.

## 2015-07-29 NOTE — Progress Notes (Signed)
Subjective:  Robert [name] is a 40 y.o. year old very pleasant male patient who presents for/with See problem oriented charting ROS- complains of sweating after he takes the Strattera in the morning-encouraged him to follow-up with his psychiatrist. No constipation or diarrhea. No hot or cold intolerance.  Past Medical History-  Patient Active Problem List   Diagnosis Date Noted  . Depression, major ([employer]) 09/27/2014    Priority: Medium  . 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 Anxiety state 01/11/2014    Priority: Medium  . Morbid obesity ([place]) 01/11/2014    Priority: Medium  . ADHD (attention deficit hyperactivity disorder) 06/02/2011    Priority: Medium  . Erectile dysfunction 09/27/2014    Priority: Low  . Vitamin D deficiency 07/23/2014    Priority: Low  . S/P partial thyroidectomy 02/28/2014    Priority: Low  . Cyst of mandible 12/22/2013    Priority: Low  . Wart 02/27/2015  . Volar plate injury of finger 01/09/2015  . Rash 09/27/2014    Medications- reviewed and updated Current Outpatient Prescriptions  Medication Sig Dispense Refill  . atomoxetine (STRATTERA) 100 MG capsule Take 100 mg by mouth daily.    Marland Kitchen SYNTHROID 112 MCG tablet     . cyclobenzaprine (FLEXERIL) 10 MG tablet Take 1 tablet (10 mg total) by mouth 2 (two) times daily as needed for muscle spasms. (Patient not taking: Reported on 07/29/2015) 20 tablet 0  . SUMAtriptan (IMITREX) 50 MG tablet Take 1 tablet (50 mg total) by mouth every 2 (two) hours as needed for migraine. May repeat in 2 hours if headache persists or recurs. (Patient not taking: Reported on 07/29/2015) 10 tablet 3   No current facility-administered medications for this visit.    Objective: BP 120/82 mmHg  Pulse 77  Temp(Src) 98.4 F (36.9 C)  Wt 313 lb (141.976 kg) Gen: NAD, resting comfortably No thyromegaly CV: RRR no murmurs rubs or gallops Lungs: CTAB no crackles, wheeze, rhonchi Abdomen:  soft/nontender/nondistended/normal bowel sounds. No rebound or guarding.  Ext: no edema Skin: warm, dry no obvious dry skin on hands Neuro: grossly normal, moves all extremities  Assessment/Plan:  Post-surgical hypothyroidism S: Patient states he has gained 50 pounds in the last 6 months. He is concerned this is due to his thyroid. He would like his TSH to be below 2 at least. He is also interested in [place] [facility] and more advanced thyroid testing. Review of records show his weight is up 20 pounds from July 2016  To 07/29/2015. He also complains of some dry skin on hands and his nails seem to split if grow long. In regards to his personal efforts for weight loss-Working towards more paleo diet, no fried food, very little bread, as much water as possible. Sparing sweet tea. Walks twice a week for 30 minutes.  A/P: We will check a TSH, free T4, free T3. Discussed depending on findings would consider endocrinology referral. May just need to titrate up his levothyroxine from 112 mcg. I think there is certainly some lifestyle habits strongly contributing weight gain as well though.   Return precautions advised.   Orders Placed This Encounter  Procedures  . TSH    [place]  . T4, free    [place]  . T3, free   The duration of face-to-face time during this visit was 15 minutes. Greater than 50% of this time was spent in counseling, explanation of diagnosis, planning of further management, and/or coordination of care.  Garret Reddish, MD

## 2015-07-29 NOTE — Patient Instructions (Signed)
Labs before you leave today  We will work to get TSH below 2 and see if there are any other issues  May consider endocrine consult depending on results

## 2015-07-30 LAB — T4, FREE: Free T4: 0.94 ng/dL (ref 0.60–1.60)

## 2015-07-30 LAB — T3, FREE: T3, Free: 3.3 pg/mL (ref 2.3–4.2)

## 2015-07-30 LAB — TSH: TSH: 1.02 u[IU]/mL (ref 0.35–4.50)

## 2015-09-03 LAB — TSH: TSH: 2.28 u[IU]/mL (ref <=5.90)

## 2015-10-10 ENCOUNTER — Other Ambulatory Visit (INDEPENDENT_AMBULATORY_CARE_PROVIDER_SITE_OTHER): Payer: BLUE CROSS/BLUE SHIELD

## 2015-10-10 DIAGNOSIS — Z0000 Encounter for general adult medical examination without abnormal findings: Secondary | ICD-10-CM

## 2015-10-10 LAB — CBC WITH DIFFERENTIAL/PLATELET
Basophils Absolute: 0.1 10*3/uL (ref 0.0–0.1)
Basophils Relative: 0.6 % (ref 0.0–3.0)
Eosinophils Absolute: 0.1 10*3/uL (ref 0.0–0.7)
Eosinophils Relative: 1.2 % (ref 0.0–5.0)
HCT: 46.8 % (ref 39.0–52.0)
Hemoglobin: 15.8 g/dL (ref 13.0–17.0)
Lymphocytes Relative: 36.8 % (ref 12.0–46.0)
Lymphs Abs: 3.2 10*3/uL (ref 0.7–4.0)
MCHC: 33.8 g/dL (ref 30.0–36.0)
MCV: 87.9 fl (ref 78.0–100.0)
Monocytes Absolute: 1.1 10*3/uL — ABNORMAL HIGH (ref 0.1–1.0)
Monocytes Relative: 12.9 % — ABNORMAL HIGH (ref 3.0–12.0)
Neutro Abs: 4.2 10*3/uL (ref 1.4–7.7)
Neutrophils Relative %: 48.5 % (ref 43.0–77.0)
Platelets: 257 10*3/uL (ref 150.0–400.0)
RBC: 5.33 Mil/uL (ref 4.22–5.81)
RDW: 13.8 % (ref 11.5–15.5)
WBC: 8.7 10*3/uL (ref 4.0–10.5)

## 2015-10-10 LAB — LIPID PANEL
Cholesterol: 223 mg/dL — ABNORMAL HIGH (ref 0–200)
HDL: 28.2 mg/dL — ABNORMAL LOW (ref >39.00)
LDL Cholesterol: 160 mg/dL — ABNORMAL HIGH (ref 0–99)
NonHDL: 194.76
Total CHOL/HDL Ratio: 8
Triglycerides: 172 mg/dL — ABNORMAL HIGH (ref 0.0–149.0)
VLDL: 34.4 mg/dL (ref 0.0–40.0)

## 2015-10-10 LAB — POC URINALSYSI DIPSTICK (AUTOMATED)
Bilirubin, UA: NEGATIVE
Blood, UA: NEGATIVE
Glucose, UA: NEGATIVE
Ketones, UA: NEGATIVE
Leukocytes, UA: NEGATIVE
Nitrite, UA: NEGATIVE
Protein, UA: NEGATIVE
Spec Grav, UA: 1.025
Urobilinogen, UA: 0.2
pH, UA: 5.5

## 2015-10-10 LAB — HEPATIC FUNCTION PANEL
ALT: 26 U/L (ref 0–53)
AST: 20 U/L (ref 0–37)
Albumin: 4.2 g/dL (ref 3.5–5.2)
Alkaline Phosphatase: 56 U/L (ref 39–117)
Bilirubin, Direct: 0.1 mg/dL (ref 0.0–0.3)
Total Bilirubin: 0.7 mg/dL (ref 0.2–1.2)
Total Protein: 6.3 g/dL (ref 6.0–8.3)

## 2015-10-10 LAB — BASIC METABOLIC PANEL
BUN: 18 mg/dL (ref 6–23)
CO2: 29 mEq/L (ref 19–32)
Calcium: 9 mg/dL (ref 8.4–10.5)
Chloride: 112 mEq/L (ref 96–112)
Creatinine, Ser: 1.19 mg/dL (ref 0.40–1.50)
GFR: 72.84 mL/min (ref >60.00)
Glucose, Bld: 85 mg/dL (ref 70–99)
Potassium: 4 mEq/L (ref 3.5–5.1)
Sodium: 141 mEq/L (ref 135–145)

## 2015-10-10 LAB — TSH: TSH: 1.4 u[IU]/mL (ref 0.35–4.50)

## 2015-10-17 ENCOUNTER — Ambulatory Visit (INDEPENDENT_AMBULATORY_CARE_PROVIDER_SITE_OTHER): Payer: BLUE CROSS/BLUE SHIELD | Admitting: Family Medicine

## 2015-10-17 ENCOUNTER — Encounter: Payer: Self-pay | Admitting: Family Medicine

## 2015-10-17 VITALS — BP 118/80 | HR 68 | Temp 98.5°F | Ht 73.0 in | Wt 319.0 lb

## 2015-10-17 DIAGNOSIS — Z0000 Encounter for general adult medical examination without abnormal findings: Secondary | ICD-10-CM | POA: Diagnosis not present

## 2015-10-17 NOTE — Patient Instructions (Addendum)
Sign release of information at the check out desk for [facility] for records  Advise 2000 IU a day of Vit D. Try to ask them to add vitamin D to next years labs   Would strongly advise at least 10 lbs of weight loss over next 3 months, then continue that pattern if you can.  My 5 to Fitness!  5: fruits and vegetables per day (work on 9 per day if you are at 5) 4: exercise 5 times per week for at least 30 minutes (walking counts!) 3: meals per day (don't skip breakfast!) 0-1: sweet per day (2 cookies, 1 small cup of ice cream, 12 oz soda)

## 2015-10-17 NOTE — Progress Notes (Signed)
Phone: [phone]  Subjective:  Patient presents today for their annual physical. Chief complaint-noted.   See problem oriented charting- ROS- full  review of systems was completed and negative including No chest pain or shortness of breath. No headache or blurry vision.   The following were reviewed and entered/updated in epic: Past Medical History  Diagnosis Date  . Chronic sinusitis   . ADD (attention deficit disorder) without hyperactivity     according to his mother/ work test possible  . Sleep apnea   . Headache   . Kidney stones    Patient Active Problem List   Diagnosis Date Noted  . Depression, major ([place]) 09/27/2014    Priority: Medium  . 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 Anxiety state 01/11/2014    Priority: Medium  . Morbid obesity ([place]) 01/11/2014    Priority: Medium  . ADHD (attention deficit hyperactivity disorder) 06/02/2011    Priority: Medium  . Erectile dysfunction 09/27/2014    Priority: Low  . Vitamin D deficiency 07/23/2014    Priority: Low  . S/P partial thyroidectomy 02/28/2014    Priority: Low  . Cyst of mandible 12/22/2013    Priority: Low  . Wart 02/27/2015  . Volar plate injury of finger 01/09/2015  . Rash 09/27/2014   Past Surgical History  Procedure Laterality Date  . Nasal sinus surgery    . Hernia repair      pt. was an infant when this surgery occured  . Thyroid surgery Right 02/28/2014    DR TEOH    PARTIAL THYROIDECTOMY  . Thyroidectomy Right 02/28/2014    Procedure: RIGHT HEMI THYROIDECTOMY;  Surgeon: Ascencion Dike, MD;  [facility];  Service: ENT;  Laterality: Right;    Family History  Problem Relation Age of Onset  . Hyperlipidemia Mother   . Hypertension Mother   . Thyroid disease Neg Hx     Medications- reviewed and updated Current Outpatient Prescriptions  Medication Sig Dispense Refill  . anastrozole (ARIMIDEX) 1 MG tablet TK 1 T PO ONCE A WEEK  0  . atomoxetine (STRATTERA) 100  MG capsule Take 100 mg by mouth daily.    . clomiPHENE (CLOMID) 50 MG tablet TK 1/2  T PO ONCE A DAY  0  . cyclobenzaprine (FLEXERIL) 10 MG tablet Take 1 tablet (10 mg total) by mouth 2 (two) times daily as needed for muscle spasms. 20 tablet 0  . liothyronine (CYTOMEL) 5 MCG tablet Take 10 mcg by mouth daily.    . SUMAtriptan (IMITREX) 50 MG tablet Take 1 tablet (50 mg total) by mouth every 2 (two) hours as needed for migraine. May repeat in 2 hours if headache persists or recurs. 10 tablet 3  . SYNTHROID 112 MCG tablet      No current facility-administered medications for this visit.    Allergies-reviewed and updated Allergies  Allergen Reactions  . Penicillins Anaphylaxis  . Milk-Related Compounds Other (See Comments)    GI issues   . Topamax Other (See Comments)    Is allergic when taking while drinking alcohol    Social History   Social History  . Marital Status: Married    Spouse Name: N/A  . Number of Children: N/A  . Years of Education: N/A   Social History Main Topics  . Smoking status: Never Smoker   . Smokeless tobacco: Never Used  . Alcohol Use: Yes     Comment: social  . Drug Use: No  . Sexual  Activity: Yes   Other Topics Concern  . None   Social History Narrative   Married 2016. No kids.        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      HH of 1 pets dog and cats.    Objective: BP 118/80 mmHg  Pulse 68  Temp(Src) 98.5 F (36.9 C) (Oral)  Ht 6\' 1"  (1.854 m)  Wt 319 lb (144.697 kg)  BMI 42.10 kg/m2  SpO2 98% Gen: NAD, resting comfortably HEENT: Mucous membranes are moist. Oropharynx normal Neck: no thyromegaly CV: RRR no murmurs rubs or gallops Lungs: CTAB no crackles, wheeze, rhonchi Abdomen: soft/nontender/nondistended/normal bowel sounds. No rebound or guarding.  Ext: no edema Skin: warm, dry Neuro: grossly normal, moves all extremities,  PERRLA  Assessment/Plan:  40 y.o. male presenting for annual physical.  Health Maintenance counseling: 1. Anticipatory guidance: Patient counseled regarding regular dental exams, eye exams, wearing seatbelts.  2. Risk factor reduction:  Advised patient of need for regular exercise and diet rich and fruits and vegetables to reduce risk of heart attack and stroke.  3. Immunizations/screenings/ancillary studies Immunization History  Administered Date(s) Administered  . Td 07/01/2001  . Tdap 12/12/2012   4. Prostate cancer screening- no family history start age 70   5. Colon cancer screening - no family history start age 89 6. Skin cancer screening- denies any lesions he is concerned about- advised regular sunscreen use  Married- no STD testing desired  Status of chronic or acute concerns  Depression- work related issues in past. Has not tolerated SSRI trial- states doing better. For anxiety portion- takes xanax sparingly every 2-3 months for 1-2 days previously- now not on any  ADD- now managed by psychiatry [place] [facility]. Strattera (sweatier on this if takes in the day). Spoke with psych- and changed to evening and doing much better  Vitamin D deficiency- 26 9 months ago, taking none at present. 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 Has felt much better. No sexual issues anymore. They also treating Hypothyroidism- on combo of T3 and T4. Weight curve continuing to bend up- hopeful these adjustments may help.  Wt Readings from Last 3 Encounters:  10/17/15 319 lb (144.697 kg)  07/29/15 313 lb (141.976 kg)  06/26/15 309 lb (140.161 kg)   HLD- moderate elevations but would not start statin- needs weight loss. No statin until 40 unless LDL >190  Return in about 1 year (around 10/16/2016) for physical.   Meds ordered this encounter  Medications  . clomiPHENE (CLOMID) 50 MG tablet    Sig: TK  1/2  T PO ONCE A DAY    Refill:  0  . anastrozole (ARIMIDEX) 1 MG tablet    Sig: TK 1 T PO ONCE A WEEK    Refill:  0  . liothyronine (CYTOMEL) 5 MCG tablet    Sig: Take 10 mcg by mouth daily.    Return precautions advised.   Garret Reddish, MD

## 2015-10-17 NOTE — Progress Notes (Signed)
Pre visit review using our clinic review tool, if applicable. No additional management support is needed unless otherwise documented below in the visit note. 

## 2015-10-24 ENCOUNTER — Encounter: Payer: Self-pay | Admitting: Family Medicine

## 2015-12-05 ENCOUNTER — Ambulatory Visit (INDEPENDENT_AMBULATORY_CARE_PROVIDER_SITE_OTHER): Payer: BLUE CROSS/BLUE SHIELD | Admitting: Family Medicine

## 2015-12-05 ENCOUNTER — Encounter: Payer: Self-pay | Admitting: Family Medicine

## 2015-12-05 VITALS — BP 142/98 | HR 88 | Temp 98.9°F | Wt 327.2 lb

## 2015-12-05 DIAGNOSIS — L03116 Cellulitis of left lower limb: Secondary | ICD-10-CM | POA: Diagnosis not present

## 2015-12-05 MED ORDER — CLINDAMYCIN HCL 150 MG PO CAPS: 450.0000 mg | ORAL_CAPSULE | Freq: Three times a day (TID) | ORAL | 0 refills | Status: DC

## 2015-12-05 NOTE — Patient Instructions (Signed)
Take medicine 3x a day with food. Tonight- would go ahead and take 1 dose as soon as you get it then repeat right before bed.   If no improvement within 48 hours- need to seek care immediately   Cellulitis Cellulitis is an infection of the skin and the tissue beneath it. The infected area is usually red and tender. Cellulitis occurs most often in the arms and lower legs.  CAUSES  Cellulitis is caused by bacteria that enter the skin through cracks or cuts in the skin. The most common types of bacteria that cause cellulitis are staphylococci and streptococci. SIGNS AND SYMPTOMS   Redness and warmth.  Swelling.  Tenderness or pain.  Fever. DIAGNOSIS  Your health care provider can usually determine what is wrong based on a physical exam. Blood tests may also be done. TREATMENT  Treatment usually involves taking an antibiotic medicine. HOME CARE INSTRUCTIONS   Take your antibiotic medicine as directed by your health care provider. Finish the antibiotic even if you start to feel better.  Keep the infected arm or leg elevated to reduce swelling.  Apply a warm cloth to the affected area up to 4 times per day to relieve pain.  Take medicines only as directed by your health care provider.  Keep all follow-up visits as directed by your health care provider. SEEK MEDICAL CARE IF:   You notice red streaks coming from the infected area.  Your red area gets larger or turns dark in color.  Your bone or joint underneath the infected area becomes painful after the skin has healed.  Your infection returns in the same area or another area.  You notice a swollen bump in the infected area.  You develop new symptoms.  You have a fever. SEEK IMMEDIATE MEDICAL CARE IF:   You feel very sleepy.  You develop vomiting or diarrhea.  You have a general ill feeling (malaise) with muscle aches and pains.   This information is not intended to replace advice given to you by your health care  provider. Make sure you discuss any questions you have with your health care provider.   Document Released: 12/24/2004 Document Revised: 12/05/2014 Document Reviewed: 06/01/2011 Elsevier Interactive Patient Education [employer].

## 2015-12-06 NOTE — Progress Notes (Signed)
Subjective:  Robert [name] is a 40 y.o. year old very pleasant male patient who presents for/with See problem oriented charting ROS- no fever, chills, nausea, vomiting.see any ROS included in HPI as well.   Past Medical History-  Patient Active Problem List   Diagnosis Date Noted  . Depression, major ([place]) 09/27/2014    Priority: Medium  . 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 Anxiety state 01/11/2014    Priority: Medium  . Morbid obesity ([place]) 01/11/2014    Priority: Medium  . ADHD (attention deficit hyperactivity disorder) 06/02/2011    Priority: Medium  . Erectile dysfunction 09/27/2014    Priority: Low  . Vitamin D deficiency 07/23/2014    Priority: Low  . S/P partial thyroidectomy 02/28/2014    Priority: Low  . Cyst of mandible 12/22/2013    Priority: Low  . Wart 02/27/2015  . Volar plate injury of finger 01/09/2015  . Rash 09/27/2014    Medications- reviewed and updated Current Outpatient Prescriptions  Medication Sig Dispense Refill  . anastrozole (ARIMIDEX) 1 MG tablet TK 1 T PO ONCE A WEEK  0  . atomoxetine (STRATTERA) 100 MG capsule Take 100 mg by mouth daily.    . clomiPHENE (CLOMID) 50 MG tablet TK 1/2  T PO ONCE A DAY  0  . cyclobenzaprine (FLEXERIL) 10 MG tablet Take 1 tablet (10 mg total) by mouth 2 (two) times daily as needed for muscle spasms. 20 tablet 0  . SUMAtriptan (IMITREX) 50 MG tablet Take 1 tablet (50 mg total) by mouth every 2 (two) hours as needed for migraine. May repeat in 2 hours if headache persists or recurs. 10 tablet 3  . Thyroid (NATURE-THROID PO) Take 100 mg by mouth daily.     No current facility-administered medications for this visit.     Objective: BP (!) 142/98 (BP Location: Left Arm, Patient Position: Sitting, Cuff Size: Large)   Pulse 88   Temp 98.9 F (37.2 C) (Oral)   Wt (!) 327 lb 3.2 oz (148.4 kg)   SpO2 95%   BMI 43.17 kg/m  Gen: NAD, resting comfortably CV: RRR no murmurs rubs or  gallops Lungs: CTAB no crackles, wheeze, rhonchi Abdomen: soft/nontender/nondistended/normal bowel sounds. No rebound or guarding.  Ext: no pretibial edema On back of left heel there is a raised erythematous area about 1 cm in diameter, surrounding this is erythema extending beyond about softball size in circular area- this area is warm, tender to touch Skin: warm, dry, no other rash Neuro: grossly normal, moves all extremities  Assessment/Plan:  Cellulitis of left lower extremity S: Patient had played golf on Saturday and thought he had a mosquito bite. on Sunday noted some itching near but above left achilles- Monday intensified. Tuesday started to notice some swelling in the area, by Wednesday complained of 6/01 aching pain and noted up to his ankle and pain was worse with walking. Ibuprofen 800mg  helped some. By thursday noted swelling and expanding redness. Denies tick bite or every seeing attached tick.  A/P: Appears to be cellulitis with point of entry being some sort of bug bite. Patient with PCN allergy- had preferred keflex treatment. Considered doxycycline but may not have best strep coverage which is desired so opted for clindamycin 450mg  TID but we discussed likely covers aroudn 75% of strep and staph and has potential to not cover obviously- if he is not improving by 48 hours to seek care in Saturday clinic or other clinic like urgent  care if our Saturday clinic is full  I also called patient on next day and he states swelling and redness and pain improving. Has noted some mild bruising in area though. He will continue to monitor and follow above results.   Meds ordered this encounter  Medications  . Thyroid (NATURE-THROID PO)    Sig: Take 100 mg by mouth daily.  . clindamycin (CLEOCIN) 150 MG capsule    Sig: Take 3 capsules (450 mg total) by mouth 3 (three) times daily.    Dispense:  90 capsule    Refill:  0    Return precautions advised.  Garret Reddish, MD

## 2015-12-07 ENCOUNTER — Encounter: Payer: Self-pay | Admitting: Family Medicine

## 2015-12-07 ENCOUNTER — Ambulatory Visit (INDEPENDENT_AMBULATORY_CARE_PROVIDER_SITE_OTHER): Payer: BLUE CROSS/BLUE SHIELD | Admitting: Family Medicine

## 2015-12-07 VITALS — BP 123/84 | HR 97 | Temp 98.9°F | Resp 16 | Wt 323.0 lb

## 2015-12-07 DIAGNOSIS — T63301D Toxic effect of unspecified spider venom, accidental (unintentional), subsequent encounter: Secondary | ICD-10-CM | POA: Diagnosis not present

## 2015-12-07 MED ORDER — METHYLPREDNISOLONE ACETATE 80 MG/ML IJ SUSP
80.0000 mg | Freq: Once | INTRAMUSCULAR | Status: AC
  Administered 2015-12-07: 80 mg via INTRAMUSCULAR

## 2015-12-07 NOTE — Patient Instructions (Signed)
Use zyrtec or allegra daily next few days. Benadryl at night. Treat with steroid for inflammatory reaction.  Watch for fever (above 100), chills, nausea, numbness tingling etc.   Keep elevated.  Follow up by Wednesday with Dr. Yong Channel.

## 2015-12-07 NOTE — Progress Notes (Signed)
Robert [name] , [birth year], 40 y.o., male MRN: [id number] Patient Care Team    Relationship Specialty Notifications Start End  Marin Olp, MD PCP - General Family Medicine  05/22/14     CC: insect bite Subjective: Pt presents for an acute OV with concerns surrounding his spider bite. Pt was seen last week by PCP for what he presumed to be a spider bite. He reports he never actually felt or seen the spider, but noticed his lower right leg was itchy on Monday. On Tuesday he noticed three "dots" that he thought was poison ivy from playing gold over the weekend, that later developed into a blister. On Wednesday his leg started to swell and become red around his ankle. He saw his PCP who placed him on Clindamycin, which he started immediately and reports compliance in use. He admits to being at a concert last night on his feet and its was uncomfortable. When he woke up today he feels the rash portion is worse, but the redness is about the same. He denies fever, chills, nausea or vomit. He states the area feels tight, but he is able to move it fine.   Allergies  Allergen Reactions  . Penicillins Anaphylaxis  . Milk-Related Compounds Other (See Comments)    GI issues   . Topamax Other (See Comments)    Is allergic when taking while drinking alcohol   Social History  Substance Use Topics  . Smoking status: Never Smoker  . Smokeless tobacco: Never Used  . Alcohol use Yes     Comment: social   Past Medical History:  Diagnosis Date  . ADD (attention deficit disorder) without hyperactivity    according to his mother/ work test possible  . Chronic sinusitis   . Headache   . Kidney stones   . Sleep apnea    Past Surgical History:  Procedure Laterality Date  . HERNIA REPAIR     pt. was an infant when this surgery occured  . NASAL SINUS SURGERY    . THYROID SURGERY Right 02/28/2014   DR TEOH    PARTIAL THYROIDECTOMY  . THYROIDECTOMY Right 02/28/2014   Procedure: RIGHT HEMI  THYROIDECTOMY;  Surgeon: Ascencion Dike, MD;  Location: [facility] OR;  Service: ENT;  Laterality: Right;   Family History  Problem Relation Age of Onset  . Hyperlipidemia Mother   . Hypertension Mother   . Thyroid disease Neg Hx      Medication List       Accurate as of 12/07/15 12:26 PM. Always use your most recent med list.          anastrozole 1 MG tablet Commonly known as:  ARIMIDEX TK 1 T PO ONCE A WEEK   atomoxetine 100 MG capsule Commonly known as:  STRATTERA Take 100 mg by mouth daily.   clindamycin 150 MG capsule Commonly known as:  CLEOCIN Take 3 capsules (450 mg total) by mouth 3 (three) times daily.   clomiPHENE 50 MG tablet Commonly known as:  CLOMID TK 1/2  T PO ONCE A DAY   cyclobenzaprine 10 MG tablet Commonly known as:  FLEXERIL Take 1 tablet (10 mg total) by mouth 2 (two) times daily as needed for muscle spasms.   NATURE-THROID PO Take 100 mg by mouth daily.   SUMAtriptan 50 MG tablet Commonly known as:  IMITREX Take 1 tablet (50 mg total) by mouth every 2 (two) hours as needed for migraine. May repeat in 2 hours if headache  persists or recurs.       No results found for this or any previous visit (from the past 24 hour(s)). No results found.   ROS: Negative, with the exception of above mentioned in HPI   Objective:  BP 123/84   Pulse 97   Temp 98.9 F (37.2 C) (Oral)   Resp 16   Wt (!) 323 lb (146.5 kg)   SpO2 98%   BMI 42.61 kg/m  Body mass index is 42.61 kg/m. Gen: Afebrile. No acute distress. Nontoxic in appearance, well developed, well nourished. Obese male.  HENT: AT. [place]. MMM Eyes:Pupils Equal Round Reactive to light, Extraocular movements intact,  Conjunctiva without redness, discharge or icterus. MSK/Skin: ~2cm blister posterior left ankle over achilles. Mild redness in marked area of prior redness. Mild rash formation, fine. ? Petechiae formation superior and inferior to ankle. Moderate swelling, warmth within prior markings  superiorly (except for rash). NROM with mild discomfort on plantar flexion.  Neuro:. Alert. Oriented x3   Assessment/Plan: Robert [name] is a 40 y.o. male present for acute OV for  Spider bite, accidental or unintentional, subsequent encounter - By report and prior marking, the rash portion is worsening. There is not evidence of an abscess or fluctuance within the blister formation. Pt is afebrile.  - He reports "itchiness", suspect some inflammatory/allergic reaction, plus him walking/standing on his feet at the concert making condition worse. - Advised patient to keep off feet as much as possible next 2 days, keeping leg elevated. Do not rupture the blister. Continue clindamycin as directed. Scheduled NSAIDS.  - Oral antihistamine encouraged and/or benadryl use. IM depo to help with inflammatory reaction.   - Discussed emergent care. If brown recluse spider will need to monitor for formation of necrosis, or spread of infection along tendon sheath.  - After starting all above, if fever, chills, nausea, increase swelling/redness, numbness/tingling or discoloration occurs he is to report the ED immediately. Otherwise he is to call PCP Monday to be scheduled early this week for recheck.   electronically signed by:  Engram Pouch, DO  [place]

## 2015-12-07 NOTE — Progress Notes (Signed)
Pre visit review using our clinic review tool, if applicable. No additional management support is needed unless otherwise documented below in the visit note. 

## 2015-12-10 ENCOUNTER — Ambulatory Visit (INDEPENDENT_AMBULATORY_CARE_PROVIDER_SITE_OTHER): Payer: BLUE CROSS/BLUE SHIELD | Admitting: Family Medicine

## 2015-12-10 ENCOUNTER — Encounter: Payer: Self-pay | Admitting: Family Medicine

## 2015-12-10 VITALS — BP 136/88 | HR 87 | Temp 98.6°F | Wt 326.6 lb

## 2015-12-10 DIAGNOSIS — L03116 Cellulitis of left lower limb: Secondary | ICD-10-CM | POA: Diagnosis not present

## 2015-12-10 NOTE — Progress Notes (Signed)
Subjective:  Robert [name] is a 41 y.o. year old very pleasant male patient who presents for/with See problem oriented charting ROS- no fever, chills, nausea, vomiting.see any ROS included in HPI as well.   Past Medical History-  Patient Active Problem List   Diagnosis Date Noted  . Depression, major ([place]) 09/27/2014    Priority: Medium  . 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 Anxiety state 01/11/2014    Priority: Medium  . Morbid obesity ([place]) 01/11/2014    Priority: Medium  . ADHD (attention deficit hyperactivity disorder) 06/02/2011    Priority: Medium  . Erectile dysfunction 09/27/2014    Priority: Low  . Vitamin D deficiency 07/23/2014    Priority: Low  . S/P partial thyroidectomy 02/28/2014    Priority: Low  . Cyst of mandible 12/22/2013    Priority: Low  . Wart 02/27/2015  . Volar plate injury of finger 01/09/2015  . Rash 09/27/2014    Medications- reviewed and updated Current Outpatient Prescriptions  Medication Sig Dispense Refill  . anastrozole (ARIMIDEX) 1 MG tablet TK 1 T PO ONCE A WEEK  0  . atomoxetine (STRATTERA) 100 MG capsule Take 100 mg by mouth daily.    . clindamycin (CLEOCIN) 150 MG capsule Take 3 capsules (450 mg total) by mouth 3 (three) times daily. 90 capsule 0  . clomiPHENE (CLOMID) 50 MG tablet TK 1/2  T PO ONCE A DAY  0  . cyclobenzaprine (FLEXERIL) 10 MG tablet Take 1 tablet (10 mg total) by mouth 2 (two) times daily as needed for muscle spasms. 20 tablet 0  . SUMAtriptan (IMITREX) 50 MG tablet Take 1 tablet (50 mg total) by mouth every 2 (two) hours as needed for migraine. May repeat in 2 hours if headache persists or recurs. 10 tablet 3  . Thyroid (NATURE-THROID PO) Take 100 mg by mouth daily.     No current facility-administered medications for this visit.    Objective: BP 136/88 (BP Location: Left Arm, Patient Position: Sitting, Cuff Size: Large)   Pulse 87   Temp 98.6 F (37 C) (Oral)   Wt (!) 326 lb 9.6 oz  (148.1 kg)   SpO2 95%   BMI 43.09 kg/m  Gen: NAD, resting comfortably CV: RRR no murmurs rubs or gallops Lungs: CTAB no crackles, wheeze, rhonchi Abdomen: soft/nontender/nondistended/normal bowel sounds. No rebound or guarding.  Ext: no pretibial edema On back of left heel there is a raised now grayish area about 0.5 cm. There is some swelling in ankle and 1+ pitting edema in left leg, trace on right. No longer noted to hav ewarmth- level of pain rated as discomfort only when previously painful.  Skin: warm, dry, no other rash  Assessment/Plan:  Cellulitis of left lower extremity S: Today, patient states he has had continued improvement in pain level and no further exapanding redness. Pain in foot has calmed down without and with palpation. He ended up being seein in Saturday clinic and was given steroid shot for itchiness of lesion which has improved since. no antibiotic changes noted. Compliant with 10 days of clindamycin   From prior note " Patient had played golf on Saturday and thought he had a mosquito bite. on Sunday noted some itching near but above left achilles- Monday intensified. Tuesday started to notice some swelling in the area, by Wednesday complained of 6/01 aching pain and noted up to his ankle and pain was worse with walking. Ibuprofen 800mg  helped some. By thursday noted swelling  and expanding redness. Denies tick bite or every seeing attached tick.  A/P: Appears to be cellulitis with point of entry being some sort of bug bite. Patient with PCN allergy- had preferred keflex treatment. Considered doxycycline but may not have best strep coverage which is desired so opted for clindamycin 450mg  TID but we discussed likely covers aroudn 75% of strep and staph and has potential to not cover obviously- if he is not improving by 48 hours to seek care in Saturday clinic or other clinic like urgent care if our Saturday clinic is full  I also called patient on next day and he states  swelling and redness and pain improving. Has noted some mild bruising in area though. He will continue to monitor and follow above results. "  A/P: finish course of antibiotics- appears to be healing- with some residual edema likely from inflammation promoted by cellulitis. We discussed patient concern as to original cause as he never actually saw the bug- long discussion today taht in the end ultimately doesn't matter as long as improving with treatment and would finish current course.   The duration of face-to-face time during this visit was greater than 15 minutes. Greater than 50% of this time was spent in counseling as noted at end of assessment/plan    Return precautions advised.  Garret Reddish, MD

## 2015-12-10 NOTE — Patient Instructions (Signed)
Leg looks better- finish antibiotics  Swelling from cellulitis persists - suspect this will take some weeks to a month to resolve

## 2015-12-10 NOTE — Progress Notes (Signed)
Pre visit review using our clinic review tool, if applicable. No additional management support is needed unless otherwise documented below in the visit note.F

## 2016-03-14 ENCOUNTER — Other Ambulatory Visit: Payer: Self-pay | Admitting: Family Medicine

## 2016-03-16 NOTE — Telephone Encounter (Signed)
Yes thanks 

## 2016-03-16 NOTE — Telephone Encounter (Signed)
Is it okay to refill? Please advise. 

## 2016-05-12 ENCOUNTER — Ambulatory Visit (INDEPENDENT_AMBULATORY_CARE_PROVIDER_SITE_OTHER)
Admission: RE | Admit: 2016-05-12 | Discharge: 2016-05-12 | Disposition: A | Discharge status: Home / Self Care | Payer: Commercial Managed Care - PPO | Source: Ambulatory Visit | Attending: Family Medicine | Admitting: Family Medicine

## 2016-05-12 ENCOUNTER — Encounter: Payer: Self-pay | Admitting: Family Medicine

## 2016-05-12 ENCOUNTER — Other Ambulatory Visit: Payer: Self-pay | Admitting: Family Medicine

## 2016-05-12 ENCOUNTER — Ambulatory Visit (INDEPENDENT_AMBULATORY_CARE_PROVIDER_SITE_OTHER): Payer: Commercial Managed Care - PPO | Admitting: Family Medicine

## 2016-05-12 VITALS — BP 128/82 | HR 83 | Temp 98.6°F | Ht 73.0 in | Wt 311.2 lb

## 2016-05-12 DIAGNOSIS — R0602 Shortness of breath: Secondary | ICD-10-CM

## 2016-05-12 DIAGNOSIS — R05 Cough: Secondary | ICD-10-CM | POA: Diagnosis not present

## 2016-05-12 DIAGNOSIS — J40 Bronchitis, not specified as acute or chronic: Secondary | ICD-10-CM | POA: Diagnosis not present

## 2016-05-12 DIAGNOSIS — R059 Cough, unspecified: Secondary | ICD-10-CM

## 2016-05-12 MED ORDER — PREDNISONE 20 MG PO TABS: ORAL_TABLET | ORAL | 0 refills | Status: DC

## 2016-05-12 MED ORDER — ALBUTEROL SULFATE HFA 108 (90 BASE) MCG/ACT IN AERS: 2.0000 | INHALATION_SPRAY | Freq: Four times a day (QID) | RESPIRATORY_TRACT | 0 refills | Status: DC | PRN

## 2016-05-12 NOTE — Patient Instructions (Signed)
Bronchitis History and exam today are suggestive of viral infection most likely due to bronchitis. Symptomatic treatment with: albuterol inhaler if wheezing. Would continue mucinex DM.   Discussed prednisone:we will treat with this if x-ray normal  Discussed chest x-ray: given the shortness of breath though mild, oxygen only at 94%, ? Decreased breath sounds right lung base- opted to get chest x-ray to be sure not pneumonia  We discussed that we did not find any infection that had higher probability of being bacterial such as pneumonia, strep throat, ear infection, bacterial sinusitis. We discussed signs that bacterial infection may have developed particularly fever or shortness of breath and reasons for follow up (symptoms worsen, last past expected time frame, new concerns arise).  Likely course of 3-6 weeks. Patient is contagious and advised good handwashing and consideration of mask If going to be in public places.

## 2016-05-12 NOTE — Progress Notes (Signed)
PCP: Garret Reddish, MD  Subjective:  Robert [name] is a 41 y.o. year old very pleasant male patient who presents with  symptoms including nasal congestion, sore throat, cough, chest congestion. Started out with cough, sore throat, nasal congestion. Nasal congestion largely gone- now a deeper cough and chest congestion persist. Admits to very mild shortness of breath - does have some wheeze as well -started: about 3 weeks ago, symptoms are not improving as hoped -previous treatments: robitussen dm for 3 days- not helping much -sick contacts/travel/risks: denies flu exposure.   ROS-denies fever, NVD, tooth pain. Denies significant shortness of breath. No leg swelling, no recent prolonged travel.   Pertinent Past Medical History-  Patient Active Problem List   Diagnosis Date Noted  . Depression, major 09/27/2014    Priority: Medium  . 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 Anxiety state 01/11/2014    Priority: Medium  . Morbid obesity ([place]) 01/11/2014    Priority: Medium  . ADHD (attention deficit hyperactivity disorder) 06/02/2011    Priority: Medium  . Erectile dysfunction 09/27/2014    Priority: Low  . Vitamin D deficiency 07/23/2014    Priority: Low  . S/P partial thyroidectomy 02/28/2014    Priority: Low  . Cyst of mandible 12/22/2013    Priority: Low  . Wart 02/27/2015  . Volar plate injury of finger 01/09/2015  . Rash 09/27/2014    Medications- reviewed  Current Outpatient Prescriptions  Medication Sig Dispense Refill  . amphetamine-dextroamphetamine (ADDERALL XR) 30 MG 24 hr capsule Take 30 mg by mouth daily.    Marland Kitchen anastrozole (ARIMIDEX) 1 MG tablet TK 1 T PO ONCE A WEEK  0  . clomiPHENE (CLOMID) 50 MG tablet TK 1/2  T PO ONCE A DAY  0  . cyclobenzaprine (FLEXERIL) 10 MG tablet Take 1 tablet (10 mg total) by mouth 2 (two) times daily as needed for muscle spasms. 20 tablet 0  . SUMAtriptan (IMITREX) 50 MG tablet Take 1 tablet (50 mg total) by  mouth every 2 (two) hours as needed for migraine. May repeat in 2 hours if headache persists or recurs. 10 tablet 3  . Thyroid (NATURE-THROID PO) Take 100 mg by mouth daily.     No current facility-administered medications for this visit.     Objective: BP 128/82 (BP Location: Left Arm, Patient Position: Sitting, Cuff Size: Large)   Pulse 83   Temp 98.6 F (37 C) (Oral)   Ht 6\' 1"  (1.854 m)   Wt (!) 311 lb 3.2 oz (141.2 kg)   SpO2 94%   BMI 41.06 kg/m  Gen: NAD, resting comfortably HEENT: Turbinates erythematous with only mild clear drainage, TM normal, pharynx mildly erythematous with no tonsilar exudate or edema, no sinus tenderness CV: RRR no murmurs rubs or gallops Lungs: CTAB no crackles, wheeze, rhonchi  Ext: no edema Skin: warm, dry, no rash  Assessment/Plan:  Bronchitis History and exam today are suggestive of viral infection most likely due to bronchitis. Symptomatic treatment with: albuterol inhaler if wheezing. Would continue mucinex DM.   Discussed prednisone:we will treat with this if x-ray normal  Discussed chest x-ray: given the shortness of breath though mild, oxygen only at 94%, ? Decreased breath sounds right lung base- opted to get chest x-ray to be sure not pneumonia. PCN allergic with anaphylaxis- would likely try azithromycin  We discussed that we did not find any infection that had higher probability of being bacterial such as pneumonia, strep throat, ear infection,  bacterial sinusitis. We discussed signs that bacterial infection may have developed particularly fever or shortness of breath and reasons for follow up (symptoms worsen, last past expected time frame, new concerns arise).  Likely course of 3-6 weeks. Patient is contagious and advised good handwashing and consideration of mask If going to be in public places.   Meds ordered this encounter  Medications  . amphetamine-dextroamphetamine (ADDERALL XR) 30 MG 24 hr capsule    Sig: Take 30 mg by mouth  daily.  Marland Kitchen albuterol (PROVENTIL HFA;VENTOLIN HFA) 108 (90 Base) MCG/ACT inhaler    Sig: Inhale 2 puffs into the lungs every 6 (six) hours as needed for wheezing or shortness of breath.    Dispense:  1 Inhaler    Refill:  0   Orders Placed This Encounter  Procedures  . DG Chest 2 View    Standing Status:   Future    Standing Expiration Date:   07/10/2017    Order Specific Question:   Reason for Exam (SYMPTOM  OR DIAGNOSIS REQUIRED)    Answer:   cough for 3 weeks. suspect bronchitis but mild decrease breath sounds RLL- rule out pneumonia    Order Specific Question:   Preferred imaging location?    Answer:   Judithann Sauger, MD

## 2016-05-12 NOTE — Progress Notes (Signed)
Pre visit review using our clinic review tool, if applicable. No additional management support is needed unless otherwise documented below in the visit note. 

## 2016-05-12 NOTE — Progress Notes (Signed)
prednisone

## 2016-05-18 ENCOUNTER — Encounter: Payer: Self-pay | Admitting: Family Medicine

## 2016-05-18 MED ORDER — AZITHROMYCIN 250 MG PO TABS: ORAL_TABLET | ORAL | 0 refills | Status: DC

## 2016-05-27 ENCOUNTER — Ambulatory Visit (INDEPENDENT_AMBULATORY_CARE_PROVIDER_SITE_OTHER): Payer: Commercial Managed Care - PPO

## 2016-05-27 ENCOUNTER — Ambulatory Visit (INDEPENDENT_AMBULATORY_CARE_PROVIDER_SITE_OTHER): Payer: Commercial Managed Care - PPO | Admitting: Emergency Medicine

## 2016-05-27 VITALS — BP 150/100 | HR 84 | Temp 98.0°F | Resp 22 | Ht 73.0 in | Wt 309.4 lb

## 2016-05-27 DIAGNOSIS — R059 Cough, unspecified: Secondary | ICD-10-CM

## 2016-05-27 DIAGNOSIS — R05 Cough: Secondary | ICD-10-CM

## 2016-05-27 DIAGNOSIS — R0789 Other chest pain: Secondary | ICD-10-CM

## 2016-05-27 DIAGNOSIS — J40 Bronchitis, not specified as acute or chronic: Secondary | ICD-10-CM

## 2016-05-27 DIAGNOSIS — R0781 Pleurodynia: Secondary | ICD-10-CM | POA: Diagnosis not present

## 2016-05-27 MED ORDER — HYDROCODONE-ACETAMINOPHEN 5-325 MG PO TABS
1.0000 | ORAL_TABLET | Freq: Four times a day (QID) | ORAL | 0 refills | Status: DC | PRN
Start: 2016-05-27 — End: 2016-08-28

## 2016-05-27 MED ORDER — PREDNISONE 20 MG PO TABS
40.0000 mg | ORAL_TABLET | Freq: Every day | ORAL | 0 refills | Status: AC
Start: 2016-05-27 — End: 2016-06-01

## 2016-05-27 NOTE — Progress Notes (Signed)
Robert [name] 41 y.o.   Chief Complaint  Patient presents with  . Back Pain    Left side, pt states he had bronchitis x 1 month  . Cough    HISTORY OF PRESENT ILLNESS: This is a 41 y.o. male complaining of severe pain to left posterio-lateral rib cage since coughing this am. Has had bronchitis x 1 month. Took Z-pak, prednisone  HPI   Prior to Admission medications   Medication Sig Start Date End Date Taking? Authorizing Provider  albuterol (PROVENTIL HFA;VENTOLIN HFA) 108 (90 Base) MCG/ACT inhaler Inhale 2 puffs into the lungs every 6 (six) hours as needed for wheezing or shortness of breath. 05/12/16  Yes Marin Olp, MD  amphetamine-dextroamphetamine (ADDERALL XR) 30 MG 24 hr capsule Take 30 mg by mouth daily.   Yes Historical Provider, MD  anastrozole (ARIMIDEX) 1 MG tablet TK 1 T PO ONCE A WEEK 10/07/15  Yes Historical Provider, MD  clomiPHENE (CLOMID) 50 MG tablet TK 1/2  T PO ONCE A DAY 10/10/15  Yes Historical Provider, MD  cyclobenzaprine (FLEXERIL) 10 MG tablet Take 1 tablet (10 mg total) by mouth 2 (two) times daily as needed for muscle spasms. 12/04/14  Yes Margarita Mail, PA-C  SUMAtriptan (IMITREX) 50 MG tablet Take 1 tablet (50 mg total) by mouth every 2 (two) hours as needed for migraine. May repeat in 2 hours if headache persists or recurs. 03/15/15  Yes Marin Olp, MD  Thyroid (NATURE-THROID PO) Take 100 mg by mouth daily.   Yes Historical Provider, MD    Allergies  Allergen Reactions  . Penicillins Anaphylaxis  . Milk-Related Compounds Other (See Comments)    GI issues   . Topamax Other (See Comments)    Is allergic when taking while drinking alcohol    Patient Active Problem List   Diagnosis Date Noted  . Wart 02/27/2015  . Volar plate injury of finger 01/09/2015  . Depression, major 09/27/2014  . Rash 09/27/2014  . Erectile dysfunction 09/27/2014  . Vitamin D deficiency 07/23/2014  . 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 S/P partial  thyroidectomy 02/28/2014  . Anxiety state 01/11/2014  . Morbid obesity ([place]) 01/11/2014  . Cyst of mandible 12/22/2013  . ADHD (attention deficit hyperactivity disorder) 06/02/2011    Past Medical History:  Diagnosis Date  . ADD (attention deficit disorder) without hyperactivity    according to his mother/ work test possible  . Chronic sinusitis   . Headache   . Kidney stones   . Sleep apnea     Past Surgical History:  Procedure Laterality Date  . HERNIA REPAIR     pt. was an infant when this surgery occured  . NASAL SINUS SURGERY    . THYROID SURGERY Right 02/28/2014   DR TEOH    PARTIAL THYROIDECTOMY  . THYROIDECTOMY Right 02/28/2014   Procedure: RIGHT HEMI THYROIDECTOMY;  Surgeon: Ascencion Dike, MD;  Location: [facility] OR;  Service: ENT;  Laterality: Right;    Social History   Social History  . Marital status: Married    Spouse name: N/A  . Number of children: N/A  . Years of education: N/A   Occupational History  . Not on file.   Social History Main Topics  . Smoking status: Never Smoker  . Smokeless tobacco: Never Used  . Alcohol use Yes     Comment: social  . Drug use: No  . Sexual activity: Yes   Other Topics Concern  . Not on file  Social History Narrative   Married 2016. No kids.        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      HH of 1 pets dog and cats.    Family History  Problem Relation Age of Onset  . Hyperlipidemia Mother   . Hypertension Mother   . Thyroid disease Neg Hx      Review of Systems  Constitutional: Negative for chills, fever and malaise/fatigue.  HENT: Negative.   Eyes: Negative for discharge and redness.  Respiratory: Positive for cough. Negative for hemoptysis and shortness of breath.   Cardiovascular: Positive for chest pain. Negative for leg swelling.  Gastrointestinal: Negative for abdominal pain, diarrhea, nausea and vomiting.  Genitourinary:  Negative for dysuria and hematuria.  Musculoskeletal: Positive for back pain. Negative for myalgias.  Skin: Negative for rash.  Neurological: Negative for dizziness and headaches.  Endo/Heme/Allergies: Does not bruise/bleed easily.  All other systems reviewed and are negative.  Vitals:   05/27/16 0940  BP: (!) 150/100  Pulse: 84  Resp: (!) 22  Temp: 98 F (36.7 C)     Physical Exam  Constitutional: He is oriented to person, place, and time. He appears well-developed and well-nourished.  HENT:  Head: Normocephalic and atraumatic.  Nose: Nose normal.  Mouth/Throat: Oropharynx is clear and moist.  Eyes: Conjunctivae and EOM are normal. Pupils are equal, round, and reactive to light.  Neck: Normal range of motion. Neck supple. No JVD present. No thyromegaly present.  Cardiovascular: Normal rate, regular rhythm and normal heart sounds.   Pulmonary/Chest: Effort normal. He has wheezes (intermittent). He exhibits tenderness (left posterio-lateral).  Abdominal: Soft. He exhibits no distension. There is no tenderness.  Musculoskeletal: Normal range of motion.  Lymphadenopathy:    He has no cervical adenopathy.  Neurological: He is alert and oriented to person, place, and time. No sensory deficit. He exhibits normal muscle tone.  Skin: Skin is warm and dry. Capillary refill takes less than 2 seconds.  Psychiatric: He has a normal mood and affect. His behavior is normal.  Vitals reviewed.  CXR: NAD; no PTX or rib Fx.  ASSESSMENT & PLAN: Motaz was seen today for back pain and cough.  Diagnoses and all orders for this visit:  Acute chest wall pain -     DG Ribs Unilateral W/Chest Left; Future  Bronchitis Comments: improving  Cough  Other orders -     HYDROcodone-acetaminophen (NORCO) 5-325 MG tablet; Take 1 tablet by mouth every 6 (six) hours as needed for moderate pain. -     predniSONE (DELTASONE) 20 MG tablet; Take 2 tablets (40 mg total) by mouth daily with  breakfast.    Patient Instructions       IF you received an x-ray today, you will receive an invoice from [facility]. Please contact [facility] at [phone] with questions or concerns regarding your invoice.   IF you received labwork today, you will receive an invoice from [facility]. Please contact [facility] at [phone] with questions or concerns regarding your invoice.   Our billing staff will not be able to assist you with questions regarding bills from these companies.  You will be contacted with the lab results as soon as they are available. The fastest way to get your results is to activate your My Chart account. Instructions are located on the last page of this paperwork. If you have not heard from [place] regarding the results in  2 weeks, please contact this office.     Chest Wall Pain Chest wall pain is pain in or around the bones and muscles of your chest. Sometimes, an injury causes this pain. Sometimes, the cause may not be known. This pain may take several weeks or longer to get better. Follow these instructions at home: Pay attention to any changes in your symptoms. Take these actions to help with your pain:  Rest as told by your doctor.  Avoid activities that cause pain. Try not to use your chest, belly (abdominal), or side muscles to lift heavy things.  If directed, apply ice to the painful area:  Put ice in a plastic bag.  Place a towel between your skin and the bag.  Leave the ice on for 20 minutes, 2-3 times per day.  Take over-the-counter and prescription medicines only as told by your doctor.  Do not use tobacco products, including cigarettes, chewing tobacco, and e-cigarettes. If you need help quitting, ask your doctor.  Keep all follow-up visits as told by your doctor. This is important. Contact a doctor if:  You have a fever.  Your chest pain gets worse.  You have new symptoms. Get help right away if:  You feel sick to your  stomach (nauseous) or you throw up (vomit).  You feel sweaty or light-headed.  You have a cough with phlegm (sputum) or you cough up blood.  You are short of breath. This information is not intended to replace advice given to you by your health care provider. Make sure you discuss any questions you have with your health care provider. Document Released: 09/02/2007 Document Revised: 08/22/2015 Document Reviewed: 06/11/2014 Elsevier Interactive Patient Education  2017 Elsevier Inc.      Agustina Caroli, MD [facility]

## 2016-05-27 NOTE — Patient Instructions (Addendum)
     IF you received an x-ray today, you will receive an invoice from [facility]. Please contact [facility] at [phone] with questions or concerns regarding your invoice.   IF you received labwork today, you will receive an invoice from [place]. Please contact [facility] at [phone] with questions or concerns regarding your invoice.   Our billing staff will not be able to assist you with questions regarding bills from these companies.  You will be contacted with the lab results as soon as they are available. The fastest way to get your results is to activate your My Chart account. Instructions are located on the last page of this paperwork. If you have not heard from [place] regarding the results in 2 weeks, please contact this office.     Chest Wall Pain Chest wall pain is pain in or around the bones and muscles of your chest. Sometimes, an injury causes this pain. Sometimes, the cause may not be known. This pain may take several weeks or longer to get better. Follow these instructions at home: Pay attention to any changes in your symptoms. Take these actions to help with your pain:  Rest as told by your doctor.  Avoid activities that cause pain. Try not to use your chest, belly (abdominal), or side muscles to lift heavy things.  If directed, apply ice to the painful area:  Put ice in a plastic bag.  Place a towel between your skin and the bag.  Leave the ice on for 20 minutes, 2-3 times per day.  Take over-the-counter and prescription medicines only as told by your doctor.  Do not use tobacco products, including cigarettes, chewing tobacco, and e-cigarettes. If you need help quitting, ask your doctor.  Keep all follow-up visits as told by your doctor. This is important. Contact a doctor if:  You have a fever.  Your chest pain gets worse.  You have new symptoms. Get help right away if:  You feel sick to your stomach (nauseous) or you throw up  (vomit).  You feel sweaty or light-headed.  You have a cough with phlegm (sputum) or you cough up blood.  You are short of breath. This information is not intended to replace advice given to you by your health care provider. Make sure you discuss any questions you have with your health care provider. Document Released: 09/02/2007 Document Revised: 08/22/2015 Document Reviewed: 06/11/2014 Elsevier Interactive Patient Education  2017 [employer].

## 2016-06-17 DIAGNOSIS — Z79899 Other long term (current) drug therapy: Secondary | ICD-10-CM | POA: Diagnosis not present

## 2016-08-28 ENCOUNTER — Encounter: Payer: Self-pay | Admitting: Family Medicine

## 2016-08-28 ENCOUNTER — Ambulatory Visit (INDEPENDENT_AMBULATORY_CARE_PROVIDER_SITE_OTHER): Payer: Commercial Managed Care - PPO | Admitting: Family Medicine

## 2016-08-28 VITALS — BP 136/84 | HR 100 | Temp 98.3°F | Ht 73.0 in | Wt 302.8 lb

## 2016-08-28 DIAGNOSIS — R05 Cough: Secondary | ICD-10-CM

## 2016-08-28 DIAGNOSIS — M5417 Radiculopathy, lumbosacral region: Secondary | ICD-10-CM | POA: Diagnosis not present

## 2016-08-28 DIAGNOSIS — R053 Chronic cough: Secondary | ICD-10-CM

## 2016-08-28 DIAGNOSIS — M5416 Radiculopathy, lumbar region: Secondary | ICD-10-CM

## 2016-08-28 MED ORDER — PREDNISONE 20 MG PO TABS: ORAL_TABLET | ORAL | 0 refills | Status: DC

## 2016-08-28 MED ORDER — AZITHROMYCIN 250 MG PO TABS: ORAL_TABLET | ORAL | 0 refills | Status: DC

## 2016-08-28 NOTE — Patient Instructions (Signed)
Trial prednisone for the back/sinuses/lungs to decrease inflammation  Use azithromycin antibiotic in case this is a lingering atypical bacterial infection  Update me on the back and the cough in about 10 days .Seek care if new or worsening symptoms particularly leg weakness, incontinence, significant numbness/tingling in leg

## 2016-08-28 NOTE — Progress Notes (Signed)
Subjective:  Robert [name] is a 41 y.o. year old very pleasant male patient who presents for/with See problem oriented charting ROS- no fever, chills, nausea, shortness of breath or chest pain   Past Medical History-  Patient Active Problem List   Diagnosis Date Noted  . Depression, major 09/27/2014    Priority: Medium  . 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 Anxiety state 01/11/2014    Priority: Medium  . Morbid obesity ([place]) 01/11/2014    Priority: Medium  . ADHD (attention deficit hyperactivity disorder) 06/02/2011    Priority: Medium  . Wart 02/27/2015    Priority: Low  . Volar plate injury of finger 01/09/2015    Priority: Low  . Rash 09/27/2014    Priority: Low  . Erectile dysfunction 09/27/2014    Priority: Low  . Vitamin D deficiency 07/23/2014    Priority: Low  . S/P partial thyroidectomy 02/28/2014    Priority: Low  . Cyst of mandible 12/22/2013    Priority: Low  . Lumbar back pain with radiculopathy affecting right lower extremity 08/29/2016    Medications- reviewed and updated Current Outpatient Prescriptions  Medication Sig Dispense Refill  . Amphet-Dextroamphet 3-Bead ER (MYDAYIS) 50 MG CP24 Take 50 mg by mouth daily.    Marland Kitchen anastrozole (ARIMIDEX) 1 MG tablet TK 1 T PO ONCE A WEEK  0  . clomiPHENE (CLOMID) 50 MG tablet TK 1/2  T PO ONCE A DAY  0  . cyclobenzaprine (FLEXERIL) 10 MG tablet Take 1 tablet (10 mg total) by mouth 2 (two) times daily as needed for muscle spasms. 20 tablet 0  . SUMAtriptan (IMITREX) 50 MG tablet Take 1 tablet (50 mg total) by mouth every 2 (two) hours as needed for migraine. May repeat in 2 hours if headache persists or recurs. 10 tablet 3  . Thyroid (NATURE-THROID PO) Take 100 mg by mouth daily.    Marland Kitchen azithromycin (ZITHROMAX) 250 MG tablet Take 2 tabs on day 1, then 1 tab daily until finished 6 tablet 0  . predniSONE (DELTASONE) 20 MG tablet Take 2 pills for 3 days, 1 pill for 4 days 10 tablet 0   No  current facility-administered medications for this visit.     Objective: BP 136/84 (BP Location: Left Arm, Patient Position: Sitting, Cuff Size: Large)   Pulse 100   Temp 98.3 F (36.8 C) (Oral)   Ht 6\' 1"  (1.854 m)   Wt (!) 302 lb 12.8 oz (137.3 kg)   SpO2 96%   BMI 39.95 kg/m  Gen: NAD, resting comfortably Some clear rhinorrhea. TM normal, some erythema of pharynx CV: RRR no murmurs rubs or gallops Lungs: CTAB no crackles, wheeze, rhonchi Abdomen: obese Ext: no edema Skin: warm, dry  Back - Normal skin, Spine with normal alignment and no deformity.  No tenderness to vertebral process palpation.  Paraspinous muscles are tender on right side but without spasm.   Range of motion is full at neck and lumbar sacral regions. Positive Straight leg raise.  Neuro- no saddle anesthesia, 5/5 strength lower extremities, 2+ reflexes   Assessment/Plan:  Chronic cough S:Cough since at least February Treated with prednisone for bronchitis-felt better short term A week or two of minimal symptoms then restarted  Productive at times, not at others. Am seems to have a lot of mucus. 3-4x a day will notice large chunk of yellow or white sputum.   No sneezing. No watery itchy eyes. No sinus pressure. Some ear pressure in  morning. Some discharge from nose ranging from clear to white to yellow.  A/P: Lungs are clear. Sinuses with some discharge- wonder if this is more of a chronic bacterial sinusitis- trial prednisone and azithromycin in combination. Also wonder about allergic element- but no sneezing, watery itchy eyes or other allergic symptoms.   Depending on how he is doing in 10 days- consider x-ray vs. Trial allergy or GERD medicaiton  Lumbar back pain with radiculopathy affecting right lower extremity S:Low back pain for 2 months. Has been worked on by his mom who is a Doctor, general practice. Was walking on sand on vacatoin- foot would hit sand and then slide out. Pain up to 5/10  but bothers him with every step and even with sitting about 2/10. When lifts right leg causes worsening pain in the right leg. He has tried Tens unit, ice, advil as well with little relief A/P: We will trial 7 day course of prednisone for the sinus symptoms with hope some overlap for the back. Conisdering pain has been going on for 2 months and is having pain significant enough to make using his full strength in leg difficult- if he is not significantly better would refer to orthopedics. I am not confident this will help his back but hopeful may help radicular symptoms. He will update me in 10 days  10 day update  Sent to [facility] where he will be for next week Meds ordered this encounter  Medications  . Amphet-Dextroamphet 3-Bead ER (MYDAYIS) 50 MG CP24    Sig: Take 50 mg by mouth daily.  Marland Kitchen azithromycin (ZITHROMAX) 250 MG tablet    Sig: Take 2 tabs on day 1, then 1 tab daily until finished    Dispense:  6 tablet    Refill:  0  . predniSONE (DELTASONE) 20 MG tablet    Sig: Take 2 pills for 3 days, 1 pill for 4 days    Dispense:  10 tablet    Refill:  0    Return precautions advised.  Garret Reddish, MD

## 2016-08-29 DIAGNOSIS — M5416 Radiculopathy, lumbar region: Secondary | ICD-10-CM | POA: Insufficient documentation

## 2016-08-29 NOTE — Assessment & Plan Note (Signed)
S:Low back pain for 2 months. Has been worked on by his mom who is a Doctor, general practice. Was walking on sand on vacatoin- foot would hit sand and then slide out. Pain up to 5/10 but bothers him with every step and even with sitting about 2/10. When lifts right leg causes worsening pain in the right leg. He has tried Tens unit, ice, advil as well with little relief A/P: We will trial 7 day course of prednisone for the sinus symptoms with hope some overlap for the back. Conisdering pain has been going on for 2 months and is having pain significant enough to make using his full strength in leg difficult- if he is not significantly better would refer to orthopedics. I am not confident this will help his back but hopeful may help radicular symptoms. He will update me in 10 days

## 2016-09-17 ENCOUNTER — Other Ambulatory Visit: Payer: Self-pay

## 2016-09-17 ENCOUNTER — Encounter: Payer: Self-pay | Admitting: Family Medicine

## 2016-09-17 DIAGNOSIS — M5416 Radiculopathy, lumbar region: Secondary | ICD-10-CM

## 2016-10-08 ENCOUNTER — Ambulatory Visit (INDEPENDENT_AMBULATORY_CARE_PROVIDER_SITE_OTHER): Payer: Commercial Managed Care - PPO | Admitting: Orthopedic Surgery

## 2016-10-08 ENCOUNTER — Encounter (INDEPENDENT_AMBULATORY_CARE_PROVIDER_SITE_OTHER): Payer: Self-pay | Admitting: Orthopedic Surgery

## 2016-10-08 DIAGNOSIS — M5441 Lumbago with sciatica, right side: Secondary | ICD-10-CM

## 2016-10-08 NOTE — Progress Notes (Signed)
Office Visit Note   Patient: Robert [name]           Date of Birth: Sep 14, 1975           MRN: [id number] Visit Date: 10/08/2016 Requested by: Marin Olp, MD Robert [name], [place] [id number] PCP: Marin Olp, MD  Subjective: Chief Complaint  Patient presents with  . Lower Back - Pain    HPI: Robert [name] is a 41 year old patient with 3-1/2 month history of low back pain.  Started after walking on an uneven surface on the beach in the wet sand.  He has tried physical therapy chiropractic care acupuncture TENS unit massage therapy steroid muscle relaxer and pain medication without relief from any.Marland Kitchen  He describes that he is waking with pain now in his low back.  He reports numbness and tingling in the right leg lateral aspect only.  States that his right leg stays tight.  Takes Advil for pain.  Had radiographs that the chiropractor which were interpreted as normal.  He works in [employer].  He is not a smoker.              ROS: All systems reviewed are negative as they relate to the chief complaint within the history of present illness.  Patient denies  fevers or chills.   Assessment & Plan: Visit Diagnoses:  1. Right-sided low back pain with right-sided sciatica, unspecified chronicity     Plan: Impression is low back pain with right-sided radiculopathy absent Achilles reflex positive nerve retention signs on the right with likely L4-5 herniated disc.  Plan MRI scan L-spine evaluate right-sided radiculopathy with likely epidural steroid injections to follow  Follow-Up Instructions: Return for after MRI.   Orders:  Orders Placed This Encounter  Procedures  . MR Lumbar Spine w/o contrast   No orders of the defined types were placed in this encounter.     Procedures: No procedures performed   Clinical Data: No additional findings.  Objective: Vital Signs: There were no vitals taken for this visit.  Physical Exam:   Constitutional: Patient appears  well-developed HEENT:  Head: Normocephalic Eyes:EOM are normal Neck: Normal range of motion Cardiovascular: Normal rate Pulmonary/chest: Effort normal Neurologic: Patient is alert Skin: Skin is warm Psychiatric: Patient has normal mood and affect    Ortho Exam: Orthopedic exam demonstrates normal gait and alignment normal leg lengths palpable pedal pulses absent Achilles reflex on the right but symmetric patellar reflexes 1+ out of 4.  Negative Babinski negative clonus 5 out of 5 ankle dorsiflex and plantar flexion quite hamstring strength.  Does have positive contralateral straight-leg raise on the left as well as positive straight-leg raise on the right.  Does have paresthesias in the L5 distribution on the right compared to the left.  No saddle paresthesias.  Some pain with forward lateral bending.  No trochanteric tenderness.  Specialty Comments:  No specialty comments available.  Imaging: No results found.   PMFS History: Patient Active Problem List   Diagnosis Date Noted  . Lumbar back pain with radiculopathy affecting right lower extremity 08/29/2016  . Wart 02/27/2015  . Volar plate injury of finger 01/09/2015  . Depression, major 09/27/2014  . Rash 09/27/2014  . Erectile dysfunction 09/27/2014  . Vitamin D deficiency 07/23/2014  . 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 S/P partial thyroidectomy 02/28/2014  . Anxiety state 01/11/2014  . Morbid obesity ([place]) 01/11/2014  . Cyst of mandible 12/22/2013  . ADHD (attention deficit hyperactivity disorder) 06/02/2011  Past Medical History:  Diagnosis Date  . ADD (attention deficit disorder) without hyperactivity    according to his mother/ work test possible  . Chronic sinusitis   . Headache   . Kidney stones   . Sleep apnea     Family History  Problem Relation Age of Onset  . Hyperlipidemia Mother   . Hypertension Mother   . Thyroid disease Neg Hx     Past Surgical History:  Procedure Laterality Date  .  HERNIA REPAIR     pt. was an infant when this surgery occured  . NASAL SINUS SURGERY    . THYROID SURGERY Right 02/28/2014   DR TEOH    PARTIAL THYROIDECTOMY  . THYROIDECTOMY Right 02/28/2014   Procedure: RIGHT HEMI THYROIDECTOMY;  Surgeon: Ascencion Dike, MD;  Location: [facility];  Service: ENT;  Laterality: Right;   Social History   Occupational History  . Not on file.   Social History Main Topics  . Smoking status: Never Smoker  . Smokeless tobacco: Never Used  . Alcohol use Yes     Comment: social  . Drug use: No  . Sexual activity: Yes

## 2016-10-13 ENCOUNTER — Other Ambulatory Visit (INDEPENDENT_AMBULATORY_CARE_PROVIDER_SITE_OTHER): Payer: Commercial Managed Care - PPO

## 2016-10-13 DIAGNOSIS — E785 Hyperlipidemia, unspecified: Secondary | ICD-10-CM

## 2016-10-13 DIAGNOSIS — E890 Postprocedural hypothyroidism: Secondary | ICD-10-CM | POA: Diagnosis not present

## 2016-10-13 DIAGNOSIS — Z0000 Encounter for general adult medical examination without abnormal findings: Secondary | ICD-10-CM

## 2016-10-13 LAB — POC URINALSYSI DIPSTICK (AUTOMATED)
Bilirubin, UA: NEGATIVE
Blood, UA: NEGATIVE
Glucose, UA: NEGATIVE
Ketones, UA: NEGATIVE
Leukocytes, UA: NEGATIVE
Nitrite, UA: NEGATIVE
Protein, UA: NEGATIVE
Spec Grav, UA: >=1.03 — AB (ref 1.010–1.025)
Urobilinogen, UA: 0.2 E.U./dL
pH, UA: 6 (ref 5.0–8.0)

## 2016-10-13 LAB — HEMOGLOBIN A1C: Hgb A1c MFr Bld: 5.4 % (ref 4.6–6.5)

## 2016-10-13 LAB — CBC WITH DIFFERENTIAL/PLATELET
Basophils Absolute: 0.1 10*3/uL (ref 0.0–0.1)
Basophils Relative: 1.1 % (ref 0.0–3.0)
Eosinophils Absolute: 0.1 10*3/uL (ref 0.0–0.7)
Eosinophils Relative: 0.7 % (ref 0.0–5.0)
HCT: 48.4 % (ref 39.0–52.0)
Hemoglobin: 16.3 g/dL (ref 13.0–17.0)
Lymphocytes Relative: 38.9 % (ref 12.0–46.0)
Lymphs Abs: 3 10*3/uL (ref 0.7–4.0)
MCHC: 33.7 g/dL (ref 30.0–36.0)
MCV: 90.1 fl (ref 78.0–100.0)
Monocytes Absolute: 0.8 10*3/uL (ref 0.1–1.0)
Monocytes Relative: 10.3 % (ref 3.0–12.0)
Neutro Abs: 3.7 10*3/uL (ref 1.4–7.7)
Neutrophils Relative %: 49 % (ref 43.0–77.0)
Platelets: 281 10*3/uL (ref 150.0–400.0)
RBC: 5.37 Mil/uL (ref 4.22–5.81)
RDW: 13.6 % (ref 11.5–15.5)
WBC: 7.6 10*3/uL (ref 4.0–10.5)

## 2016-10-13 LAB — COMPREHENSIVE METABOLIC PANEL
ALT: 18 U/L (ref 0–53)
AST: 18 U/L (ref 0–37)
Albumin: 4.2 g/dL (ref 3.5–5.2)
Alkaline Phosphatase: 49 U/L (ref 39–117)
BUN: 18 mg/dL (ref 6–23)
CO2: 30 mEq/L (ref 19–32)
Calcium: 9.8 mg/dL (ref 8.4–10.5)
Chloride: 105 mEq/L (ref 96–112)
Creatinine, Ser: 1.18 mg/dL (ref 0.40–1.50)
GFR: 73.16 mL/min (ref >60.00)
Glucose, Bld: 89 mg/dL (ref 70–99)
Potassium: 5.2 mEq/L — ABNORMAL HIGH (ref 3.5–5.1)
Sodium: 141 mEq/L (ref 135–145)
Total Bilirubin: 0.9 mg/dL (ref 0.2–1.2)
Total Protein: 6.1 g/dL (ref 6.0–8.3)

## 2016-10-13 LAB — LIPID PANEL
Cholesterol: 200 mg/dL (ref 0–200)
HDL: 32.9 mg/dL — ABNORMAL LOW (ref >39.00)
LDL Cholesterol: 139 mg/dL — ABNORMAL HIGH (ref 0–99)
NonHDL: 167.08
Total CHOL/HDL Ratio: 6
Triglycerides: 142 mg/dL (ref 0.0–149.0)
VLDL: 28.4 mg/dL (ref 0.0–40.0)

## 2016-10-13 LAB — TSH: TSH: 2.97 u[IU]/mL (ref 0.35–4.50)

## 2016-10-20 ENCOUNTER — Ambulatory Visit (INDEPENDENT_AMBULATORY_CARE_PROVIDER_SITE_OTHER): Payer: Commercial Managed Care - PPO | Admitting: Family Medicine

## 2016-10-20 ENCOUNTER — Encounter: Payer: Self-pay | Admitting: Family Medicine

## 2016-10-20 VITALS — BP 130/88 | HR 84 | Temp 98.8°F | Ht 74.25 in | Wt 303.2 lb

## 2016-10-20 DIAGNOSIS — F900 Attention-deficit hyperactivity disorder, predominantly inattentive type: Secondary | ICD-10-CM

## 2016-10-20 DIAGNOSIS — E559 Vitamin D deficiency, unspecified: Secondary | ICD-10-CM | POA: Diagnosis not present

## 2016-10-20 DIAGNOSIS — Z0000 Encounter for general adult medical examination without abnormal findings: Secondary | ICD-10-CM
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
## 2016-10-20 NOTE — Assessment & Plan Note (Signed)
ADD- managed by [place] attention specailists. On mydayis. strattera caused intense sweating

## 2016-10-20 NOTE — Assessment & Plan Note (Signed)
Vitamin D- last year advised 2000 IU per day- was at goal- repeat next year likely. He is taking 5000 units actually

## 2016-10-20 NOTE — Patient Instructions (Addendum)
Cut down on eating out, fried foods, and breads  Target 150 minutes a week exercise  6 month weight check would be reasonable

## 2016-10-26 ENCOUNTER — Ambulatory Visit (INDEPENDENT_AMBULATORY_CARE_PROVIDER_SITE_OTHER): Payer: Commercial Managed Care - PPO | Admitting: Orthopedic Surgery

## 2016-11-06 DIAGNOSIS — G4733 Obstructive sleep apnea (adult) (pediatric): Secondary | ICD-10-CM | POA: Diagnosis not present

## 2016-11-06 DIAGNOSIS — Z79899 Other long term (current) drug therapy: Secondary | ICD-10-CM | POA: Diagnosis not present

## 2016-11-09 DIAGNOSIS — E291 Testicular hypofunction: Secondary | ICD-10-CM | POA: Diagnosis not present

## 2016-11-09 DIAGNOSIS — E7212 Methylenetetrahydrofolate reductase deficiency: Secondary | ICD-10-CM | POA: Diagnosis not present

## 2017-02-04 DIAGNOSIS — Z79899 Other long term (current) drug therapy: Secondary | ICD-10-CM | POA: Diagnosis not present

## 2017-02-04 DIAGNOSIS — G4733 Obstructive sleep apnea (adult) (pediatric): Secondary | ICD-10-CM | POA: Diagnosis not present

## 2017-02-24 DIAGNOSIS — E291 Testicular hypofunction: Secondary | ICD-10-CM | POA: Diagnosis not present

## 2017-02-24 DIAGNOSIS — R5383 Other fatigue: Secondary | ICD-10-CM | POA: Diagnosis not present

## 2017-03-10 DIAGNOSIS — E039 Hypothyroidism, unspecified: Secondary | ICD-10-CM | POA: Diagnosis not present

## 2017-03-10 DIAGNOSIS — R5383 Other fatigue: Secondary | ICD-10-CM | POA: Diagnosis not present

## 2017-03-10 DIAGNOSIS — E291 Testicular hypofunction: Secondary | ICD-10-CM | POA: Diagnosis not present

## 2017-05-07 DIAGNOSIS — Z79899 Other long term (current) drug therapy: Secondary | ICD-10-CM | POA: Diagnosis not present
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
## 2017-05-14 DIAGNOSIS — E039 Hypothyroidism, unspecified: Secondary | ICD-10-CM | POA: Diagnosis not present

## 2017-06-07 ENCOUNTER — Ambulatory Visit (INDEPENDENT_AMBULATORY_CARE_PROVIDER_SITE_OTHER): Payer: Commercial Managed Care - PPO | Admitting: Physician Assistant

## 2017-06-07 ENCOUNTER — Encounter: Payer: Self-pay | Admitting: Physician Assistant

## 2017-06-07 VITALS — BP 120/76 | HR 100 | Temp 98.9°F | Ht 74.25 in | Wt 307.0 lb

## 2017-06-07 DIAGNOSIS — R197 Diarrhea, unspecified: Secondary | ICD-10-CM

## 2017-06-07 DIAGNOSIS — R110 Nausea: Secondary | ICD-10-CM

## 2017-06-07 DIAGNOSIS — R1084 Generalized abdominal pain: Secondary | ICD-10-CM

## 2017-06-07 LAB — COMPREHENSIVE METABOLIC PANEL
ALT: 25 U/L (ref 0–53)
AST: 20 U/L (ref 0–37)
Albumin: 4.4 g/dL (ref 3.5–5.2)
Alkaline Phosphatase: 51 U/L (ref 39–117)
BUN: 17 mg/dL (ref 6–23)
CO2: 27 mEq/L (ref 19–32)
Calcium: 9.3 mg/dL (ref 8.4–10.5)
Chloride: 102 mEq/L (ref 96–112)
Creatinine, Ser: 1.17 mg/dL (ref 0.40–1.50)
GFR: 73.63 mL/min (ref >60.00)
Glucose, Bld: 120 mg/dL — ABNORMAL HIGH (ref 70–99)
Potassium: 4.1 mEq/L (ref 3.5–5.1)
Sodium: 138 mEq/L (ref 135–145)
Total Bilirubin: 1.2 mg/dL (ref 0.2–1.2)
Total Protein: 6.5 g/dL (ref 6.0–8.3)

## 2017-06-07 LAB — CBC WITH DIFFERENTIAL/PLATELET
Basophils Absolute: 0 10*3/uL (ref 0.0–0.1)
Basophils Relative: 0.3 % (ref 0.0–3.0)
Eosinophils Absolute: 0 10*3/uL (ref 0.0–0.7)
Eosinophils Relative: 0.1 % (ref 0.0–5.0)
HCT: 50.3 % (ref 39.0–52.0)
Hemoglobin: 17.1 g/dL — ABNORMAL HIGH (ref 13.0–17.0)
Lymphocytes Relative: 5.1 % — ABNORMAL LOW (ref 12.0–46.0)
Lymphs Abs: 0.7 10*3/uL (ref 0.7–4.0)
MCHC: 34 g/dL (ref 30.0–36.0)
MCV: 88.7 fl (ref 78.0–100.0)
Monocytes Absolute: 0.7 10*3/uL (ref 0.1–1.0)
Monocytes Relative: 5.1 % (ref 3.0–12.0)
Neutro Abs: 12 10*3/uL — ABNORMAL HIGH (ref 1.4–7.7)
Neutrophils Relative %: 89.4 % — ABNORMAL HIGH (ref 43.0–77.0)
Platelets: 296 10*3/uL (ref 150.0–400.0)
RBC: 5.66 Mil/uL (ref 4.22–5.81)
RDW: 13.9 % (ref 11.5–15.5)
WBC: 13.4 10*3/uL — ABNORMAL HIGH (ref 4.0–10.5)

## 2017-06-07 LAB — POC INFLUENZA A&B (BINAX/QUICKVUE)
Influenza A, POC: NEGATIVE
Influenza B, POC: NEGATIVE

## 2017-06-07 LAB — LIPASE: Lipase: 17 U/L (ref 11.0–59.0)

## 2017-06-07 MED ORDER — ONDANSETRON HCL 4 MG PO TABS: 4.0000 mg | ORAL_TABLET | Freq: Three times a day (TID) | ORAL | 0 refills | Status: DC | PRN

## 2017-06-07 MED ORDER — ONDANSETRON HCL 4 MG/2ML IJ SOLN
4.0000 mg | Freq: Once | INTRAMUSCULAR | Status: AC
Start: 2017-06-07 — End: 2017-06-07
  Administered 2017-06-07: 4 mg via INTRAMUSCULAR

## 2017-06-07 NOTE — Patient Instructions (Addendum)
It was great to meet you!  If you cannot keep fluids down --> please go to an urgent care or the ER where you can get IV fluids.   Viral Gastroenteritis, Adult Viral gastroenteritis is also known as the stomach flu. This condition is caused by certain germs (viruses). These germs can be passed from person to person very easily (are very contagious). This condition can cause sudden watery poop (diarrhea), fever, and throwing up (vomiting). Having watery poop and throwing up can make you feel weak and cause you to get dehydrated. Dehydration can make you tired and thirsty, make you have a dry mouth, and make it so you pee (urinate) less often. Older adults and people with other diseases or a weak defense system (immune system) are at higher risk for dehydration. It is important to replace the fluids that you lose from having watery poop and throwing up. Follow these instructions at home: Follow instructions from your doctor about how to care for yourself at home. Eating and drinking  Follow these instructions as told by your doctor:  Take an oral rehydration solution (ORS). This is a drink that is sold at pharmacies and stores.  Drink clear fluids in small amounts as you are able, such as: ? Water. ? Ice chips. ? Diluted fruit juice. ? Low-calorie sports drinks.  Eat bland, easy-to-digest foods in small amounts as you are able, such as: ? Bananas. ? Applesauce. ? Rice. ? Low-fat (lean) meats. ? Toast. ? Crackers.  Avoid fluids that have a lot of sugar or caffeine in them.  Avoid alcohol.  Avoid spicy or fatty foods.  General instructions  Drink enough fluid to keep your pee (urine) clear or pale yellow.  Wash your hands often. If you cannot use soap and water, use hand sanitizer.  Make sure that all people in your home wash their hands well and often.  Rest at home while you get better.  Take over-the-counter and prescription medicines only as told by your doctor.  Watch  your condition for any changes.  Take a warm bath to help with any burning or pain from having watery poop.  Keep all follow-up visits as told by your doctor. This is important. Contact a doctor if:  You cannot keep fluids down.  Your symptoms get worse.  You have new symptoms.  You feel light-headed or dizzy.  You have muscle cramps. Get help right away if:  You have chest pain.  You feel very weak or you pass out (faint).  You see blood in your throw-up.  Your throw-up looks like coffee grounds.  You have bloody or black poop (stools) or poop that look like tar.  You have a very bad headache, a stiff neck, or both.  You have a rash.  You have very bad pain, cramping, or bloating in your belly (abdomen).  You have trouble breathing.  You are breathing very quickly.  Your heart is beating very quickly.  Your skin feels cold and clammy.  You feel confused.  You have pain when you pee.  You have signs of dehydration, such as: ? Dark pee, hardly any pee, or no pee. ? Cracked lips. ? Dry mouth. ? Sunken eyes. ? Sleepiness. ? Weakness. This information is not intended to replace advice given to you by your health care provider. Make sure you discuss any questions you have with your health care provider. Document Released: 09/02/2007 Document Revised: 10/04/2015 Document Reviewed: 11/20/2014 Elsevier Interactive Patient Education  2017  [place] A bland diet consists of foods that do not have a lot of fat or fiber. Foods without fat or fiber are easier for the body to digest. They are also less likely to irritate your mouth, throat, stomach, and other parts of your gastrointestinal tract. A bland diet is sometimes called a BRAT diet. What is my plan? Your health care provider or dietitian may recommend specific changes to your diet to prevent and treat your symptoms, such as:  Eating small meals often.  Cooking food until it is soft  enough to chew easily.  Chewing your food well.  Drinking fluids slowly.  Not eating foods that are very spicy, sour, or fatty.  Not eating citrus fruits, such as oranges and grapefruit.  What do I need to know about this diet?  Eat a variety of foods from the bland diet food list.  Do not follow a bland diet longer than you have to.  Ask your health care provider whether you should take vitamins. What foods can I eat? Grains  Hot cereals, such as cream of wheat. Bread, crackers, or tortillas made from refined white flour. Rice. Vegetables Canned or cooked vegetables. Mashed or boiled potatoes. Fruits Bananas. Applesauce. Other types of cooked or canned fruit with the skin and seeds removed, such as canned peaches or pears. Meats and Other Protein Sources Scrambled eggs. Creamy peanut butter or other nut butters. Lean, well-cooked meats, such as chicken or fish. Tofu. Soups or broths. Dairy Low-fat dairy products, such as milk, cottage cheese, or yogurt. Beverages Water. Herbal tea. Apple juice. Sweets and Desserts Pudding. Custard. Fruit gelatin. Ice cream. Fats and Oils Mild salad dressings. Canola or olive oil. The items listed above may not be a complete list of allowed foods or beverages. Contact your dietitian for more options. What foods are not recommended? Foods and ingredients that are often not recommended include:  Spicy foods, such as hot sauce or salsa.  Fried foods.  Sour foods, such as pickled or fermented foods.  Raw vegetables or fruits, especially citrus or berries.  Caffeinated drinks.  Alcohol.  Strongly flavored seasonings or condiments.  The items listed above may not be a complete list of foods and beverages that are not allowed. Contact your dietitian for more information. This information is not intended to replace advice given to you by your health care provider. Make sure you discuss any questions you have with your health care  provider. Document Released: 07/08/2015 Document Revised: 08/22/2015 Document Reviewed: 03/28/2014 Elsevier Interactive Patient Education  2018 [employer].

## 2017-06-07 NOTE — Progress Notes (Signed)
Robert [name] is a 42 y.o. male here for a new problem.  I acted as a Education administrator for [employer], PA-C Anselmo Pickler, LPN   History of Present Illness:   Chief Complaint  Patient presents with  . Diarrhea  . Emesis    Diarrhea   This is a new problem. The current episode started today (Started at 2:00 AM). The problem occurs more than 10 times per day. The problem has been gradually worsening. The stool consistency is described as watery (white looking). The patient states that diarrhea awakens him from sleep. Associated symptoms include abdominal pain, bloating, chills, headaches, myalgias, sweats and vomiting. Pertinent negatives include no coughing, fever or URI. Risk factors include ill contacts. Treatments tried: Tums. The treatment provided no relief. There is no history of irritable bowel syndrome or a recent abdominal surgery.  Emesis   This is a new problem. The current episode started today (Around 3:00  AM). Episode frequency: Pt has vomited 4 times today. The problem has been gradually worsening. The emesis has an appearance of stomach contents and bile. There has been no fever. Associated symptoms include abdominal pain, chills, diarrhea, headaches, myalgias and sweats. Pertinent negatives include no coughing, fever or URI. Risk factors include ill contacts (Was in [place] last week). He has tried nothing for the symptoms. The treatment provided no relief.   Wife was sick with similar symptoms last week, however he was out of town when she was dealing with this. His wife is [redacted] weeks pregnant.  Last night he ate steak and potatoes, lunch was chicken wings from a rotisserie.  No blood in emesis or diarrhea.   Past Medical History:  Diagnosis Date  . ADD (attention deficit disorder) without hyperactivity    according to his mother/ work test possible  . Chronic sinusitis   . Headache   . Kidney stones   . Sleep apnea      Social History   Socioeconomic  History  . Marital status: Married    Spouse name: Not on file  . Number of children: Not on file  . Years of education: Not on file  . Highest education level: Not on file  Social Needs  . Financial resource strain: Not on file  . Food insecurity - worry: Not on file  . Food insecurity - inability: Not on file  . Transportation needs - medical: Not on file  . Transportation needs - non-medical: Not on file  Occupational History  . Not on file  Tobacco Use  . Smoking status: Never Smoker  . Smokeless tobacco: Never Used  Substance and Sexual Activity  . Alcohol use: Yes    Comment: social  . Drug use: No  . Sexual activity: Yes  Other Topics Concern  . Not on file  Social History Narrative   Married 2016. No kids.        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      HH of 1 pets dog and cats.    Past Surgical History:  Procedure Laterality Date  . HERNIA REPAIR     pt. was an infant when this surgery occured  . NASAL SINUS SURGERY    . THYROID SURGERY Right 02/28/2014   DR TEOH    PARTIAL THYROIDECTOMY  . THYROIDECTOMY Right 02/28/2014   Procedure: RIGHT HEMI THYROIDECTOMY;  Surgeon: Ascencion Dike, MD;  Location: [facility];  Service: ENT;  Laterality: Right;    Family History  Problem Relation Age of Onset  . Hyperlipidemia Mother   . Hypertension Mother   . Thyroid disease Neg Hx     Allergies  Allergen Reactions  . Penicillins Anaphylaxis  . Milk-Related Compounds Other (See Comments)    GI issues   . Topamax Other (See Comments)    Is allergic when taking while drinking alcohol    Current Medications:   Current Outpatient Medications:  .  Amphet-Dextroamphet 3-Bead ER (MYDAYIS) 50 MG CP24, Take 50 mg by mouth daily., Disp: , Rfl:  .  cyclobenzaprine (FLEXERIL) 10 MG tablet, Take 1 tablet (10 mg total) by mouth 2 (two) times daily as needed for muscle spasms., Disp: 20 tablet, Rfl: 0 .   SUMAtriptan (IMITREX) 50 MG tablet, Take 1 tablet (50 mg total) by mouth every 2 (two) hours as needed for migraine. May repeat in 2 hours if headache persists or recurs., Disp: 10 tablet, Rfl: 3 .  Thyroid (NATURE-THROID PO), Take 100 mg by mouth daily., Disp: , Rfl:  .  ondansetron (ZOFRAN) 4 MG tablet, Take 1 tablet (4 mg total) by mouth every 8 (eight) hours as needed for nausea or vomiting., Disp: 20 tablet, Rfl: 0   Review of Systems:   Review of Systems  Constitutional: Positive for chills. Negative for fever.  Respiratory: Negative for cough.   Gastrointestinal: Positive for abdominal pain, bloating, diarrhea and vomiting.  Musculoskeletal: Positive for myalgias.  Neurological: Positive for headaches.    Vitals:   Vitals:   06/07/17 1116  BP: 120/76  Pulse: 100  Temp: 98.9 F (37.2 C)  TempSrc: Oral  SpO2: 94%  Weight: (!) 307 lb (139.3 kg)  Height: 6' 2.25" (1.886 m)     Body mass index is 39.15 kg/m.  Physical Exam:   Physical Exam  Constitutional: He appears well-developed. He is cooperative.  Non-toxic appearance. He does not have a sickly appearance. He does not appear ill. No distress.  HENT:  Head: Normocephalic and atraumatic.  Right Ear: Tympanic membrane, external ear and ear canal normal. Tympanic membrane is not erythematous, not retracted and not bulging.  Left Ear: Tympanic membrane, external ear and ear canal normal. Tympanic membrane is not erythematous, not retracted and not bulging.  Nose: Nose normal. Right sinus exhibits no maxillary sinus tenderness and no frontal sinus tenderness. Left sinus exhibits no maxillary sinus tenderness and no frontal sinus tenderness.  Mouth/Throat: Uvula is midline. Mucous membranes are dry (cracked lips). No posterior oropharyngeal edema or posterior oropharyngeal erythema.  Eyes: Conjunctivae and lids are normal.  Neck: Trachea normal.  Cardiovascular: Normal rate, regular rhythm, S1 normal, S2 normal and normal  heart sounds.  Pulmonary/Chest: Effort normal and breath sounds normal. He has no decreased breath sounds. He has no wheezes. He has no rhonchi. He has no rales.  Abdominal: Normal appearance. Bowel sounds are increased. There is generalized tenderness.  Lymphadenopathy:    He has no cervical adenopathy.  Neurological: He is alert.  Skin: Skin is warm and intact. He is diaphoretic.  Psychiatric: He has a normal mood and affect. His speech is normal and behavior is normal.  Nursing note and vitals reviewed.  Results for orders placed or performed in visit on 06/07/17  POC Influenza A&B(BINAX/QUICKVUE)  Result Value Ref Range   Influenza A, POC Negative Negative   Influenza B, POC Negative Negative     Assessment and Plan:    Robert [name] was seen today for diarrhea and  emesis.  Diagnoses and all orders for this visit:  Generalized abdominal pain, Nausea and Diarrhea Flu negative. Suspect viral gastroenteritis, however will check labs to assess for any other abnormalities and assess electrolytes. Zofran IM provided in office. I have also sent in oral zofran for him to take. We discussed ED precautions, including: inability to take PO's, development of severe abdominal pain, or other worsening symptoms.   *During lab draw patient became diaphoretic. He was able to tolerate a few sips of water. We attempted to call his family to help take him home but no one answered. Patient refused to wait until his family member came to pick him and left our office with intentions to drive himself home. He appeared stable when leaving, he declined coming back to the patient room to have vitals re-checked. -     CBC with Differential/Platelet -     Comprehensive metabolic panel -     Lipase  Other orders -     ondansetron (ZOFRAN) 4 MG tablet; Take 1 tablet (4 mg total) by mouth every 8 (eight) hours as needed for nausea or vomiting.    . Reviewed expectations re: course of current medical  issues. . Discussed self-management of symptoms. . Outlined signs and symptoms indicating need for more acute intervention. . Patient verbalized understanding and all questions were answered. . See orders for this visit as documented in the electronic medical record. . Patient received an After-Visit Summary.  CMA or LPN served as scribe during this visit. History, Physical, and Plan performed by medical provider. Documentation and orders reviewed and attested to.  Inda Coke, PA-C

## 2017-06-08 ENCOUNTER — Telehealth: Payer: Self-pay | Admitting: Physician Assistant

## 2017-06-08 NOTE — Telephone Encounter (Signed)
Error

## 2017-07-30 DIAGNOSIS — Z79899 Other long term (current) drug therapy: Secondary | ICD-10-CM | POA: Diagnosis not present

## 2017-08-03 ENCOUNTER — Ambulatory Visit: Payer: Commercial Managed Care - PPO | Admitting: Family Medicine

## 2017-08-03 ENCOUNTER — Other Ambulatory Visit: Payer: Self-pay

## 2017-08-03 ENCOUNTER — Ambulatory Visit (INDEPENDENT_AMBULATORY_CARE_PROVIDER_SITE_OTHER): Payer: Commercial Managed Care - PPO | Admitting: Family Medicine

## 2017-08-03 ENCOUNTER — Encounter: Payer: Self-pay | Admitting: Family Medicine

## 2017-08-03 VITALS — BP 120/72 | HR 81 | Temp 98.2°F | Resp 16 | Ht 75.0 in | Wt 301.0 lb

## 2017-08-03 DIAGNOSIS — R059 Cough, unspecified: Secondary | ICD-10-CM

## 2017-08-03 DIAGNOSIS — J029 Acute pharyngitis, unspecified: Secondary | ICD-10-CM | POA: Diagnosis not present

## 2017-08-03 DIAGNOSIS — R05 Cough: Secondary | ICD-10-CM

## 2017-08-03 LAB — POCT RAPID STREP A (OFFICE): Rapid Strep A Screen: NEGATIVE

## 2017-08-03 MED ORDER — IPRATROPIUM BROMIDE 0.06 % NA SOLN: 2.0000 | Freq: Four times a day (QID) | NASAL | 0 refills | Status: DC

## 2017-08-03 MED ORDER — AZITHROMYCIN 250 MG PO TABS: ORAL_TABLET | ORAL | 0 refills | Status: DC

## 2017-08-03 MED ORDER — METHYLPREDNISOLONE ACETATE 80 MG/ML IJ SUSP
80.0000 mg | Freq: Once | INTRAMUSCULAR | Status: AC
  Administered 2017-08-03: 80 mg via INTRAMUSCULAR

## 2017-08-03 MED ORDER — BENZONATATE 200 MG PO CAPS: 200.0000 mg | ORAL_CAPSULE | Freq: Two times a day (BID) | ORAL | 0 refills | Status: DC | PRN

## 2017-08-03 NOTE — Patient Instructions (Signed)
Start the atrovent and azithromycin  Start tessalon for your cough.  Please stay well hydrated.  You can take tylenol and/or motrin as needed for low grade fever and pain.  Please let me know if your symptoms worsen or fail to improve.  Take care, Dr Jerline Pain

## 2017-08-03 NOTE — Telephone Encounter (Signed)
Rx sent in.  Algis Greenhouse. Jerline Pain, MD 08/03/2017 1:26 PM

## 2017-08-03 NOTE — Progress Notes (Signed)
    Subjective:  Robert [name] is a 42 y.o. male who presents today for same-day appointment with a chief complaint of sore throat.   HPI:  Sore Throat, Acute Issue Started about 3 weeks. Symptoms are worsening. Associated with cough, congestion, rhinorrhea, and watery eyes.  No fevers or chills.  Has tried over-the-counter medications including Zyrtec, Mucinex DM, and cough syrup which have not centrically help.  No known sick contacts.  No other obvious alleviating or aggravating factors.  ROS: Per HPI  PMH: He reports that he has never smoked. He has never used smokeless tobacco. He reports that he drinks alcohol. He reports that he does not use drugs.  Objective:  Physical Exam: BP 120/72   Pulse 81   Temp 98.2 F (36.8 C) (Oral)   Resp 16   Ht 6\' 3"  (1.905 m)   Wt (!) 301 lb (136.5 kg)   SpO2 97%   BMI 37.62 kg/m   Gen: NAD, resting comfortably  HEENT: TMs with clear effusion bilaterally.  Oropharynx erythematous without exudate.  Nasal mucosa erythematous and boggy bilaterally with CV: RRR with no murmurs appreciated Pulm: NWOB, CTAB with no crackles, wheezes, or rhonchi  Results for orders placed or performed in visit on 08/03/17 (from the past 24 hour(s))  POCT rapid strep A     Status: None   Collection Time: 08/03/17 11:52 AM  Result Value Ref Range   Rapid Strep A Screen Negative Negative   Assessment/Plan:  Sore throat/Cough Given 3-week course and worsening of symptoms, we will empirically treat with course of antibiotics.  Start Z-Pak today.  We will also start atrovent for rhinorrhea/sinus congestion. Will give 80mg  IM depo-medrol for sore throat.  Patient has Tessalon at home-advised him to use this as needed for his cough. Recommended tylenol and/or motrin as needed for low grade fever and pain. Encouraged good oral hydration. Return precautions reviewed. Follow up as needed.    Algis Greenhouse. Jerline Pain, MD 08/03/2017 12:15 PM

## 2017-09-09 ENCOUNTER — Ambulatory Visit (INDEPENDENT_AMBULATORY_CARE_PROVIDER_SITE_OTHER): Payer: Commercial Managed Care - PPO | Admitting: Family Medicine

## 2017-09-09 ENCOUNTER — Encounter: Payer: Self-pay | Admitting: Family Medicine

## 2017-09-09 ENCOUNTER — Other Ambulatory Visit: Payer: Self-pay

## 2017-09-09 ENCOUNTER — Ambulatory Visit: Payer: Commercial Managed Care - PPO | Admitting: Family Medicine

## 2017-09-09 VITALS — BP 112/78 | HR 97 | Temp 98.0°F | Ht 75.0 in | Wt 301.4 lb

## 2017-09-09 DIAGNOSIS — D751 Secondary polycythemia: Secondary | ICD-10-CM | POA: Diagnosis not present

## 2017-09-09 DIAGNOSIS — E785 Hyperlipidemia, unspecified: Secondary | ICD-10-CM

## 2017-09-09 DIAGNOSIS — E559 Vitamin D deficiency, unspecified: Secondary | ICD-10-CM

## 2017-09-09 DIAGNOSIS — Z0000 Encounter for general adult medical examination without abnormal findings: Secondary | ICD-10-CM

## 2017-09-09 MED ORDER — SUMATRIPTAN SUCCINATE 50 MG PO TABS: 50.0000 mg | ORAL_TABLET | ORAL | 3 refills | Status: DC | PRN

## 2017-09-09 NOTE — Progress Notes (Signed)
Subjective:  Robert [name] is a 42 y.o. year old very pleasant male patient who presents for/with See problem oriented charting ROS- No chest pain or shortness of breath. No headache or blurry vision.    Past Medical History-  Patient Active Problem List   Diagnosis Date Noted  . 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 Depression, major 09/27/2014    Priority: Medium  . 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 Anxiety state 01/11/2014    Priority: Medium  . Morbid obesity ([place]) 01/11/2014    Priority: Medium  . ADHD (attention deficit hyperactivity disorder) 06/02/2011    Priority: Medium  . Wart 02/27/2015    Priority: Low  . Volar plate injury of finger 01/09/2015    Priority: Low  . Rash 09/27/2014    Priority: Low  . Erectile dysfunction 09/27/2014    Priority: Low  . Vitamin D deficiency 07/23/2014    Priority: Low  . S/P partial thyroidectomy 02/28/2014    Priority: Low  . Cyst of mandible 12/22/2013    Priority: Low  . Lumbar back pain with radiculopathy affecting right lower extremity 08/29/2016    Medications- reviewed and updated Current Outpatient Medications  Medication Sig Dispense Refill  . amphetamine-dextroamphetamine (ADDERALL XR) 30 MG 24 hr capsule     . cetirizine (ZYRTEC) 10 MG tablet Take 10 mg by mouth daily.    . cyclobenzaprine (FLEXERIL) 10 MG tablet Take 1 tablet (10 mg total) by mouth 2 (two) times daily as needed for muscle spasms. 20 tablet 0  . MYDAYIS 37.5 MG CP24     . SUMAtriptan (IMITREX) 50 MG tablet Take 1 tablet (50 mg total) by mouth every 2 (two) hours as needed for migraine. May repeat in 2 hours if headache persists or recurs. 90 tablet 3  . Thyroid (NATURE-THROID PO) Take 120 mg by mouth daily.      No current facility-administered medications for this visit.     Objective: BP 112/78 (BP Location: Left Arm, Patient Position: Sitting, Cuff Size: Normal)   Pulse 97   Temp 98 F (36.7  C) (Oral)   Ht 6\' 3"  (1.905 m)   Wt (!) 301 lb 6.4 oz (136.7 kg)   SpO2 97%   BMI 37.67 kg/m  Gen: NAD, resting comfortably CV: RRR no murmurs rubs or gallops Lungs: CTAB no crackles, wheeze, rhonchi Abdomen: soft/nontender/nondistended/normal bowel sounds. obese Ext: trace edema Skin: warm, dry  Assessment/Plan:   Secondary polycythemia S:  testosterone pellets (6 months) injected in February. They will reduce his dose at 6 months through [facility].   Donated blood several months ago and was told come back 56 days. Then gave double reds and told come back 112 days. Went back yesterday and fingerprick hgb 20.3. Feels warmer than normal. He can go back to give blood again when hgb below 19.   He brings a form for therapeutic phlebotomy through [facility] [place] [facility].  A/P: I strongly advised him to have testosterone reduced or stopped and discussed thrombotic risks of polycythemia. While that is pending with his integrative clinic- I advised him to have the therapeutic phlebotomy every 2 weeks with 1 unit of whole blood until [facility]- I filled out form so this can be done.    Future Appointments  Date Time Provider [place]  10/15/2017  8:30 AM [facility]  10/22/2017  9:30 AM Marin Olp,  MD [facility]   Lab/Order associations: we also ordered labs for his upcoming physical Preventative health care - Plan: CBC, Comprehensive metabolic panel, Lipid panel, TSH, VITAMIN D 25 Hydroxy (Vit-D Deficiency, Fractures)  Hyperlipidemia, unspecified hyperlipidemia type - Plan: CBC, Comprehensive metabolic panel, Lipid panel, TSH  Vitamin D deficiency - Plan: VITAMIN D 25 Hydroxy (Vit-D Deficiency, Fractures)   Meds ordered this encounter  Medications  . SUMAtriptan (IMITREX) 50 MG tablet    Sig: Take 1 tablet (50 mg total) by mouth every 2 (two) hours as needed for migraine. May repeat in 2 hours if  headache persists or recurs.    Dispense:  90 tablet    Refill:  3   Return precautions advised.  Garret Reddish, MD

## 2017-09-09 NOTE — Patient Instructions (Signed)
We discussed the risks of thick blood- I agree getting some blood drawn off would lower risk. I would love if they would lower your testosterone dose to reduce the risk- if they cannot get this in normal range then honestly you would need an alternate solution- sometimes clomid is more helpful in these circumstances  See you next month  Schedule a lab visit at the check out desk within a week of your physical on July 26th. Return for future fasting labs meaning nothing but water after midnight please. Ok to take your medications with water.

## 2017-09-09 NOTE — Assessment & Plan Note (Signed)
S:  testosterone pellets (6 months) injected in February. They will reduce his dose at 6 months through [facility].   Donated blood several months ago and was told come back 56 days. Then gave double reds and told come back 112 days. Went back yesterday and fingerprick hgb 20.3. Feels warmer than normal. He can go back to give blood again when hgb below 19.   He brings a form for therapeutic phlebotomy through [facility] [place] [facility].  A/P: I strongly advised him to have testosterone reduced or stopped and discussed thrombotic risks of polycythemia. While that is pending with his integrative clinic- I advised him to have the therapeutic phlebotomy every 2 weeks with 1 unit of whole blood [facility]- I filled out form so this can be done.

## 2017-10-07 ENCOUNTER — Telehealth: Payer: Commercial Managed Care - PPO | Admitting: Family

## 2017-10-07 DIAGNOSIS — B9689 Other specified bacterial agents as the cause of diseases classified elsewhere: Secondary | ICD-10-CM

## 2017-10-07 DIAGNOSIS — J028 Acute pharyngitis due to other specified organisms: Secondary | ICD-10-CM

## 2017-10-07 MED ORDER — ALBUTEROL SULFATE HFA 108 (90 BASE) MCG/ACT IN AERS: 2.0000 | INHALATION_SPRAY | Freq: Four times a day (QID) | RESPIRATORY_TRACT | 2 refills | Status: DC | PRN

## 2017-10-07 MED ORDER — BENZONATATE 100 MG PO CAPS
100.0000 mg | ORAL_CAPSULE | Freq: Three times a day (TID) | ORAL | 0 refills | Status: DC | PRN
Start: 2017-10-07 — End: 2017-10-22

## 2017-10-07 MED ORDER — AZITHROMYCIN 250 MG PO TABS: ORAL_TABLET | ORAL | 0 refills | Status: DC

## 2017-10-07 NOTE — Progress Notes (Signed)
Thank you for the details you included in the comment boxes. Those details are very helpful in determining the best course of treatment for you and help [place] to provide the best care.  We are sorry that you are not feeling well.  Here is how we plan to help!  Based on your presentation I believe you most likely have A cough due to bacteria.  When patients have a fever and a productive cough with a change in color or increased sputum production, we are concerned about bacterial bronchitis.  If left untreated it can progress to pneumonia.  If your symptoms do not improve with your treatment plan it is important that you contact your provider.   I have prescribed Azithromyin 250 mg: two tablets now and then one tablet daily for 4 additonal days    In addition you may use A non-prescription cough medication called Mucinex DM: take 2 tablets every 12 hours. and A prescription cough medication called Tessalon Perles 100mg . You may take 1-2 capsules every 8 hours as needed for your cough.  I also sent an albuterol inhaler for you, 2 puffs every 6 hours as needed for shortness of breath  From your responses in the eVisit questionnaire you describe inflammation in the upper respiratory tract which is causing a significant cough.  This is commonly called Bronchitis and has four common causes:    Allergies  Viral Infections  Acid Reflux  Bacterial Infection Allergies, viruses and acid reflux are treated by controlling symptoms or eliminating the cause. An example might be a cough caused by taking certain blood pressure medications. You stop the cough by changing the medication. Another example might be a cough caused by acid reflux. Controlling the reflux helps control the cough.  USE OF BRONCHODILATOR ("RESCUE") INHALERS: There is a risk from using your bronchodilator too frequently.  The risk is that over-reliance on a medication which only relaxes the muscles surrounding the breathing tubes can reduce  the effectiveness of medications prescribed to reduce swelling and congestion of the tubes themselves.  Although you feel brief relief from the bronchodilator inhaler, your asthma may actually be worsening with the tubes becoming more swollen and filled with mucus.  This can delay other crucial treatments, such as oral steroid medications. If you need to use a bronchodilator inhaler daily, several times per day, you should discuss this with your provider.  There are probably better treatments that could be used to keep your asthma under control.     HOME CARE . Only take medications as instructed by your medical team. . Complete the entire course of an antibiotic. . Drink plenty of fluids and get plenty of rest. . Avoid close contacts especially the very young and the elderly . Cover your mouth if you cough or cough into your sleeve. . Always remember to wash your hands . A steam or ultrasonic humidifier can help congestion.   GET HELP RIGHT AWAY IF: . You develop worsening fever. . You become short of breath . You cough up blood. . Your symptoms persist after you have completed your treatment plan MAKE SURE YOU   Understand these instructions.  Will watch your condition.  Will get help right away if you are not doing well or get worse.  Your e-visit answers were reviewed by a board certified advanced clinical practitioner to complete your personal care plan.  Depending on the condition, your plan could have included both over the counter or prescription medications. If there is  a problem please reply  once you have received a response from your provider. Your safety is important to [place].  If you have drug allergies check your prescription carefully.    You can use MyChart to ask questions about today's visit, request a non-urgent call back, or ask for a work or school excuse for 24 hours related to this e-Visit. If it has been greater than 24 hours you will need to follow up with your  provider, or enter a new e-Visit to address those concerns. You will get an e-mail in the next two days asking about your experience.  I hope that your e-visit has been valuable and will speed your recovery. Thank you for using e-visits.

## 2017-10-13 ENCOUNTER — Other Ambulatory Visit (INDEPENDENT_AMBULATORY_CARE_PROVIDER_SITE_OTHER): Payer: Commercial Managed Care - PPO

## 2017-10-13 DIAGNOSIS — E785 Hyperlipidemia, unspecified: Secondary | ICD-10-CM | POA: Diagnosis not present

## 2017-10-13 DIAGNOSIS — E559 Vitamin D deficiency, unspecified: Secondary | ICD-10-CM

## 2017-10-13 DIAGNOSIS — Z0000 Encounter for general adult medical examination without abnormal findings: Secondary | ICD-10-CM

## 2017-10-13 LAB — COMPREHENSIVE METABOLIC PANEL
ALT: 23 U/L (ref 0–53)
AST: 18 U/L (ref 0–37)
Albumin: 4.5 g/dL (ref 3.5–5.2)
Alkaline Phosphatase: 65 U/L (ref 39–117)
BUN: 13 mg/dL (ref 6–23)
CO2: 30 mEq/L (ref 19–32)
Calcium: 9.3 mg/dL (ref 8.4–10.5)
Chloride: 103 mEq/L (ref 96–112)
Creatinine, Ser: 1.26 mg/dL (ref 0.40–1.50)
GFR: 67.47 mL/min (ref >60.00)
Glucose, Bld: 94 mg/dL (ref 70–99)
Potassium: 4.5 mEq/L (ref 3.5–5.1)
Sodium: 140 mEq/L (ref 135–145)
Total Bilirubin: 0.7 mg/dL (ref 0.2–1.2)
Total Protein: 6.7 g/dL (ref 6.0–8.3)

## 2017-10-13 LAB — LIPID PANEL
Cholesterol: 183 mg/dL (ref 0–200)
HDL: 38.7 mg/dL — ABNORMAL LOW (ref >39.00)
LDL Cholesterol: 123 mg/dL — ABNORMAL HIGH (ref 0–99)
NonHDL: 144.23
Total CHOL/HDL Ratio: 5
Triglycerides: 108 mg/dL (ref 0.0–149.0)
VLDL: 21.6 mg/dL (ref 0.0–40.0)

## 2017-10-13 LAB — CBC
HCT: 52.1 % — ABNORMAL HIGH (ref 39.0–52.0)
Hemoglobin: 17.6 g/dL — ABNORMAL HIGH (ref 13.0–17.0)
MCHC: 33.9 g/dL (ref 30.0–36.0)
MCV: 88.4 fl (ref 78.0–100.0)
Platelets: 325 10*3/uL (ref 150.0–400.0)
RBC: 5.89 Mil/uL — ABNORMAL HIGH (ref 4.22–5.81)
RDW: 14.7 % (ref 11.5–15.5)
WBC: 7.5 10*3/uL (ref 4.0–10.5)

## 2017-10-13 LAB — TSH: TSH: 4.14 u[IU]/mL (ref 0.35–4.50)

## 2017-10-13 LAB — VITAMIN D 25 HYDROXY (VIT D DEFICIENCY, FRACTURES): VITD: 51.13 ng/mL (ref 30.00–100.00)

## 2017-10-15 ENCOUNTER — Other Ambulatory Visit: Payer: Commercial Managed Care - PPO

## 2017-10-22 ENCOUNTER — Ambulatory Visit (INDEPENDENT_AMBULATORY_CARE_PROVIDER_SITE_OTHER): Payer: Commercial Managed Care - PPO | Admitting: Family Medicine

## 2017-10-22 ENCOUNTER — Encounter: Payer: Self-pay | Admitting: Family Medicine

## 2017-10-22 VITALS — BP 136/82 | HR 84 | Temp 98.4°F | Ht 75.0 in | Wt 299.2 lb

## 2017-10-22 DIAGNOSIS — E669 Obesity, unspecified: Secondary | ICD-10-CM

## 2017-10-22 DIAGNOSIS — Z0000 Encounter for general adult medical examination without abnormal findings: Secondary | ICD-10-CM | POA: Diagnosis not present

## 2017-10-22 DIAGNOSIS — D751 Secondary polycythemia: Secondary | ICD-10-CM

## 2017-10-22 DIAGNOSIS — E559 Vitamin D deficiency, unspecified: Secondary | ICD-10-CM

## 2017-10-22 DIAGNOSIS — F900 Attention-deficit hyperactivity disorder, predominantly inattentive type: Secondary | ICD-10-CM

## 2017-10-22 DIAGNOSIS — E890 Postprocedural hypothyroidism: Secondary | ICD-10-CM

## 2017-10-22 DIAGNOSIS — F325 Major depressive disorder, single episode, in full remission: Secondary | ICD-10-CM

## 2017-10-22 DIAGNOSIS — E785 Hyperlipidemia, unspecified: Secondary | ICD-10-CM

## 2017-10-22 NOTE — Patient Instructions (Addendum)
Great job losing 4 lbs! Keep up the good work. Slow and steady weight loss wins the race  Try claritin for 2 weeks- if runny nose and congestion not improving at that point see [place] back  Donate blood today with blood remaining thick  Start vitamin D 2000 units a day when you stop spending as much time in the sun   So excited for you and your wife!!!

## 2017-10-22 NOTE — Assessment & Plan Note (Signed)
Has dealt with anxiety/depression issues in the past- no recent issues as was related to old job. he also has a lot of life related to upcoming newborn/current pregnancy. phq9 of 4 last visit- full remission

## 2017-10-22 NOTE — Assessment & Plan Note (Signed)
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

## 2017-10-22 NOTE — Assessment & Plan Note (Signed)
mild- have advised weight loss and would not start statin until 42 unless LDL >190. LDL trending down.

## 2017-10-22 NOTE — Assessment & Plan Note (Signed)
for low T and thyroid -[facility] in [place] to manage

## 2017-10-22 NOTE — Progress Notes (Signed)
Phone: [phone]  Subjective:  Patient presents today for their annual physical. Chief complaint-noted.   See problem oriented charting- ROS- full  review of systems was completed and negative except for: sweating (tends to be bothered by it), congestion, runny nose  The following were reviewed and entered/updated in epic: Past Medical History:  Diagnosis Date  . ADD (attention deficit disorder) without hyperactivity    according to his mother/ work test possible  . Chronic sinusitis   . Headache   . Kidney stones   . Sleep apnea    Patient Active Problem List   Diagnosis Date Noted  . 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 Depression, major 09/27/2014    Priority: Medium  . 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 Anxiety state 01/11/2014    Priority: Medium  . Obesity (BMI 35.0-39.9 without comorbidity) 01/11/2014    Priority: Medium  . ADHD (attention deficit hyperactivity disorder) 06/02/2011    Priority: Medium  . Wart 02/27/2015    Priority: Low  . Volar plate injury of finger 01/09/2015    Priority: Low  . Rash 09/27/2014    Priority: Low  . Erectile dysfunction 09/27/2014    Priority: Low  . Vitamin D deficiency 07/23/2014    Priority: Low  . S/P partial thyroidectomy 02/28/2014    Priority: Low  . Cyst of mandible 12/22/2013    Priority: Low  . Hyperlipidemia, unspecified 10/22/2017  . Lumbar back pain with radiculopathy affecting right lower extremity 08/29/2016   Past Surgical History:  Procedure Laterality Date  . HERNIA REPAIR     pt. was an infant when this surgery occured  . NASAL SINUS SURGERY    . THYROID SURGERY Right 02/28/2014   DR TEOH    PARTIAL THYROIDECTOMY  . THYROIDECTOMY Right 02/28/2014   Procedure: RIGHT HEMI THYROIDECTOMY;  Surgeon: Ascencion Dike, MD;  Location: [facility] OR;  Service: ENT;  Laterality: Right;    Family History  Problem Relation Age of Onset  . Hyperlipidemia Mother   .  Hypertension Mother   . Thyroid disease Neg Hx     Medications- reviewed and updated Current Outpatient Medications  Medication Sig Dispense Refill  . amphetamine-dextroamphetamine (ADDERALL XR) 30 MG 24 hr capsule     . cyclobenzaprine (FLEXERIL) 10 MG tablet Take 1 tablet (10 mg total) by mouth 2 (two) times daily as needed for muscle spasms. 20 tablet 0  . MYDAYIS 37.5 MG CP24     . SUMAtriptan (IMITREX) 50 MG tablet Take 1 tablet (50 mg total) by mouth every 2 (two) hours as needed for migraine. May repeat in 2 hours if headache persists or recurs. 90 tablet 3  . Thyroid (NATURE-THROID PO) Take 120 mg by mouth daily.      No current facility-administered medications for this visit.     Allergies-reviewed and updated Allergies  Allergen Reactions  . Penicillins Anaphylaxis  . Milk-Related Compounds Other (See Comments)    GI issues   . Topamax Other (See Comments)    Is allergic when taking while drinking alcohol    Social History   Social History Narrative   Married 2016. No kids.        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      HH of 1 pets dog and cats.    Objective: BP 136/82 (BP Location: Left Arm, Patient Position: Sitting, Cuff Size: Large)  Pulse 84   Temp 98.4 F (36.9 C) (Oral)   Ht 6\' 3"  (1.905 m)   Wt 299 lb 3.2 oz (135.7 kg)   SpO2 98%   BMI 37.40 kg/m  Gen: NAD, resting comfortably HEENT: Mucous membranes are moist. Oropharynx normal Neck: no thyromegaly/ history thyroid surgery CV: RRR no murmurs rubs or gallops Lungs: CTAB no crackles, wheeze, rhonchi Abdomen: soft/nontender/nondistended/normal bowel sounds. No rebound or guarding. obese Ext: no edema Skin: warm, dry Neuro: grossly normal, moves all extremities, PERRLA  Assessment/Plan:  42 y.o. male presenting for annual physical.  Health Maintenance counseling: 1. Anticipatory guidance: Patient counseled  regarding regular dental exams -q6 months, eye exams - yearly, wearing seatbelts.  2. Risk factor reduction:  Advised patient of need for regular exercise and diet rich and fruits and vegetables to reduce risk of heart attack and stroke. Exercise- not right now, tough with schedule- encouraged him to get this started. Diet- weight down 4 lbs from last year- cut down on carbs.  Wt Readings from Last 3 Encounters:  10/22/17 299 lb 3.2 oz (135.7 kg)  09/09/17 (!) 301 lb 6.4 oz (136.7 kg)  08/03/17 (!) 301 lb (136.5 kg)  3. Immunizations/screenings/ancillary studies- up to date. Advised flu shot this year with baby.  Immunization History  Administered Date(s) Administered  . Td 07/01/2001  . Tdap 12/12/2012  4. Prostate cancer screening-  no family history, start at age 33-55  5. Colon cancer screening -  no family history, start at age 33-50 6. Skin cancer screening/prevention- no dermatogist. advised regular sunscreen use. Denies worrisome, changing, or new skin lesions.  7. Testicular cancer screening- advised monthly self exams  8. STD screening- patient opts  out. Married/monogamous  Status of chronic or acute concerns   Baby on the way - due 12/26/17.   BP improved from last year  Sweating - was worse on strattera- he has switched to an antipersperant and no issues today and then other days same deodorant has issues. He is going to go to a non aluminium based variant to see if that helps  3 weeks of congestion since getting back from [place]. Treated with z pack and tessalon and some better at least 75%. Still with some lingering congestion, cough but much less severe. A lot of runny nose. Advised to try claritin for 2 weeks to see if this will resolve issues.   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 he also has a lot of life related to upcoming newborn/current pregnancy. 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 Luckily a1c not elevated despite obesity last year- will repeat a1c if cbg high next year   Hyperlipidemia, unspecified mild- have advised weight loss and would not start statin until 42 unless LDL >190. LDL trending down.   Vitamin D deficiency Vitamin D deficiency- at goal this year- advised 2000 units per day- he plans to start this when not getting as much sun exposure  ADHD (attention deficit hyperactivity disorder) ADD- managed by [place] attention specailists. On mydayis and Adderall XR. strattera caused intense sweating  1 year CPE - did not  write on his papers  Return precautions advised.   Garret Reddish, MD

## 2017-10-22 NOTE — Assessment & Plan Note (Signed)
Obesity- weight down 4 lbs from last year. Luckily a1c not elevated despite obesity last year- will repeat a1c if cbg high next year

## 2017-10-22 NOTE — Assessment & Plan Note (Signed)
Vitamin D deficiency- at goal this year- advised 2000 units per day- he plans to start this when not getting as much sun exposure

## 2017-10-22 NOTE — Assessment & Plan Note (Signed)
ADD- managed by [place] attention specailists. On mydayis and Adderall XR. strattera caused intense sweating

## 2017-10-25 DIAGNOSIS — E291 Testicular hypofunction: Secondary | ICD-10-CM | POA: Diagnosis not present

## 2017-11-05 DIAGNOSIS — Z79899 Other long term (current) drug therapy: Secondary | ICD-10-CM | POA: Diagnosis not present

## 2018-01-24 ENCOUNTER — Telehealth: Payer: Self-pay

## 2018-01-24 NOTE — Telephone Encounter (Signed)
Copied from [place] [phone]. Topic: Appointment Scheduling - Scheduling Inquiry for Clinic >> Jan 24, 2018  3:40 PM Robert [name], Marland Kitchen wrote: Reason for CRM: pt has wart on finger and wants to know if different provider can see him to remove it since Dr Yong Channel does not have much availibility.  Pt would prefer 11/05/ if possible. Please call [phone]   [facility] to make app with you?

## 2018-01-25 ENCOUNTER — Ambulatory Visit: Payer: Commercial Managed Care - PPO | Admitting: Family Medicine

## 2018-01-25 ENCOUNTER — Encounter: Payer: Self-pay | Admitting: Family Medicine

## 2018-01-25 VITALS — BP 130/88 | HR 90 | Ht 75.0 in | Wt 302.0 lb

## 2018-01-25 DIAGNOSIS — B079 Viral wart, unspecified: Secondary | ICD-10-CM | POA: Diagnosis not present

## 2018-01-25 NOTE — Patient Instructions (Signed)
There are no preventive care reminders to display for this patient.  Depression screen [facility] 2/9 09/09/2017 05/27/2016 10/17/2015  Decreased Interest 0 0 0  Down, Depressed, Hopeless 0 0 0  PHQ - 2 Score 0 0 0  Altered sleeping 1 - -  Tired, decreased energy 0 - -  Change in appetite 1 - -  Feeling bad or failure about yourself  0 - -  Trouble concentrating 2 - -  Moving slowly or fidgety/restless 0 - -  Suicidal thoughts 0 - -  PHQ-9 Score 4 - -  Difficult doing work/chores Not difficult at all - -

## 2018-01-25 NOTE — Telephone Encounter (Signed)
Pt has been scheduled with Dr Yong Channel. Nothing further needed

## 2018-01-25 NOTE — Progress Notes (Signed)
Subjective:  Robert [name] is a 42 y.o. year old very pleasant male patient who presents for/with See problem oriented charting ROS-no fevers, chills, fatigue/malaise, nausea/vomiting, or recent weight change   Past Medical History-  Patient Active Problem List   Diagnosis Date Noted  . 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 Depression, major 09/27/2014    Priority: Medium  . 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 Anxiety state 01/11/2014    Priority: Medium  . Obesity (BMI 35.0-39.9 without comorbidity) 01/11/2014    Priority: Medium  . ADHD (attention deficit hyperactivity disorder) 06/02/2011    Priority: Medium  . Wart 02/27/2015    Priority: Low  . Volar plate injury of finger 01/09/2015    Priority: Low  . Rash 09/27/2014    Priority: Low  . Erectile dysfunction 09/27/2014    Priority: Low  . Vitamin D deficiency 07/23/2014    Priority: Low  . S/P partial thyroidectomy 02/28/2014    Priority: Low  . Cyst of mandible 12/22/2013    Priority: Low  . Hyperlipidemia, unspecified 10/22/2017  . Lumbar back pain with radiculopathy affecting right lower extremity 08/29/2016    Medications- reviewed and updated Current Outpatient Medications  Medication Sig Dispense Refill  . amphetamine-dextroamphetamine (ADDERALL XR) 30 MG 24 hr capsule     . cyclobenzaprine (FLEXERIL) 10 MG tablet Take 1 tablet (10 mg total) by mouth 2 (two) times daily as needed for muscle spasms. 20 tablet 0  . MYDAYIS 37.5 MG CP24     . SUMAtriptan (IMITREX) 50 MG tablet Take 1 tablet (50 mg total) by mouth every 2 (two) hours as needed for migraine. May repeat in 2 hours if headache persists or recurs. 90 tablet 3  . ARMOUR THYROID 120 MG tablet Take 1 tablet by mouth daily.    . Thyroid (NATURE-THROID PO) Take 120 mg by mouth daily.      Objective: BP 130/88 (BP Location: Left Arm, Patient Position: Sitting, Cuff Size: Large)   Pulse 90   Ht 6\' 3"  (1.905  m)   Wt (!) 302 lb (137 kg)   SpO2 97%   BMI 37.75 kg/m  Gen: NAD, resting comfortably CV: RRR  Lungs: nonlabored, normal respiratory rate Abdomen: soft/nondistended Skin: warm, dry3-4 x 3-4 mm raised verrucous appearing lesion on palmar side of DIP joint  Procedure note: Benefits and risks verbally discussed with patient 10 second freeze thaw cycle of cryotherapy performed  with liquid nitrogen to right 4th finger No complications.  Patient tolerated the procedure well other than mild pain. Gave handout on this from [facility] and we reviewed this content.  [email or web address]  Assessment/Plan:  Viral wart on finger S: right 4th finger- noted wart for at least 6 months. Was getting slightly bigger recently. Seemed to get worse after OTC freeze option. No pain or itching.  A/P: Viral wart on finger- new diagnosis- has toleratd cryotherapy in past with other warts in other locations. 10 second cycle completed. Procedure as above. Discussed repeat in 2-3 weeks if hasn't resolved  We also discussed flu shot but he has upcoming 42th birthday trip and wants to wait until he gets back.   Return precautions advised.  Garret Reddish, MD

## 2018-02-08 DIAGNOSIS — Z79899 Other long term (current) drug therapy: Secondary | ICD-10-CM | POA: Diagnosis not present

## 2018-02-08 DIAGNOSIS — G4733 Obstructive sleep apnea (adult) (pediatric): Secondary | ICD-10-CM | POA: Diagnosis not present

## 2018-02-28 ENCOUNTER — Encounter: Payer: Self-pay | Admitting: Family Medicine

## 2018-03-02 ENCOUNTER — Encounter: Payer: Self-pay | Admitting: Family Medicine

## 2018-03-02 MED ORDER — BUSPIRONE HCL 7.5 MG PO TABS: 7.5000 mg | ORAL_TABLET | Freq: Two times a day (BID) | ORAL | 2 refills | Status: AC | PRN

## 2018-04-09 ENCOUNTER — Emergency Department (INDEPENDENT_AMBULATORY_CARE_PROVIDER_SITE_OTHER)
Admission: EM | Admit: 2018-04-09 | Discharge: 2018-04-09 | Disposition: A | Discharge status: Home / Self Care | Payer: Self-pay | Source: Home / Self Care | Attending: Family Medicine | Admitting: Family Medicine

## 2018-04-09 ENCOUNTER — Encounter: Payer: Self-pay | Admitting: Emergency Medicine

## 2018-04-09 ENCOUNTER — Other Ambulatory Visit: Payer: Self-pay

## 2018-04-09 DIAGNOSIS — R51 Headache: Secondary | ICD-10-CM

## 2018-04-09 DIAGNOSIS — M545 Low back pain, unspecified: Secondary | ICD-10-CM

## 2018-04-09 DIAGNOSIS — S161XXA Strain of muscle, fascia and tendon at neck level, initial encounter: Secondary | ICD-10-CM

## 2018-04-09 DIAGNOSIS — R519 Headache, unspecified: Secondary | ICD-10-CM

## 2018-04-09 MED ORDER — CYCLOBENZAPRINE HCL 10 MG PO TABS: 10.0000 mg | ORAL_TABLET | Freq: Two times a day (BID) | ORAL | 0 refills | Status: DC | PRN
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
## 2018-04-09 NOTE — Discharge Instructions (Signed)
°  You may take 500mg  acetaminophen every 4-6 hours or in combination with ibuprofen 400-600mg  every 6-8 hours as needed for pain and inflammation.  Flexeril (cyclobenzaprine) is a muscle relaxer and may cause drowsiness. Do not drink alcohol, drive, or operate heavy machinery while taking.  Please call to schedule an appointment with family medicine or sports medicine if not improving in 1 week.
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
## 2018-04-11 ENCOUNTER — Telehealth: Payer: Self-pay

## 2018-04-11 NOTE — Telephone Encounter (Signed)
Spoke with patient, doing better, but asked about a script for massage therapy.  Sent message to E. Phelps regarding.

## 2018-04-11 NOTE — Telephone Encounter (Signed)
Spoke with Dr Assunta Found, and pt should use ice and follow up with PCP or sports med.

## 2018-04-13 ENCOUNTER — Telehealth: Payer: Self-pay | Admitting: Family Medicine

## 2018-04-13 NOTE — Telephone Encounter (Signed)
Please schedule him in the 4:20 slot for tomorrow
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
## 2018-04-13 NOTE — Telephone Encounter (Signed)
Patient is scheduled   

## 2018-04-14 ENCOUNTER — Encounter: Payer: Self-pay | Admitting: Family Medicine

## 2018-04-14 ENCOUNTER — Ambulatory Visit: Payer: Commercial Managed Care - PPO | Admitting: Family Medicine

## 2018-04-14 VITALS — BP 138/92 | HR 94 | Temp 98.5°F | Ht 75.0 in | Wt 300.4 lb

## 2018-04-14 DIAGNOSIS — M25561 Pain in right knee: Secondary | ICD-10-CM

## 2018-04-14 DIAGNOSIS — M62838 Other muscle spasm: Secondary | ICD-10-CM | POA: Diagnosis not present

## 2018-04-14 DIAGNOSIS — M545 Low back pain, unspecified: Secondary | ICD-10-CM
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
## 2018-04-14 NOTE — Patient Instructions (Signed)
Lets try massage therapy for 3 weeks. If not improving- perhaps trial of PT for a few weeks- could see Dr. Paulla Fore our sports medicine physician if not doing better at that point  Muscle relaxant probably best to stick at nighttime with that- im willing to refill if you need it

## 2018-05-10 DIAGNOSIS — Z79899 Other long term (current) drug therapy: Secondary | ICD-10-CM | POA: Diagnosis not present

## 2018-06-19 ENCOUNTER — Telehealth: Payer: Commercial Managed Care - PPO | Admitting: Nurse Practitioner

## 2018-06-19 DIAGNOSIS — M5412 Radiculopathy, cervical region: Secondary | ICD-10-CM

## 2018-06-19 MED ORDER — CYCLOBENZAPRINE HCL 10 MG PO TABS: 10.0000 mg | ORAL_TABLET | Freq: Three times a day (TID) | ORAL | 1 refills | Status: DC | PRN

## 2018-06-19 MED ORDER — NAPROXEN 500 MG PO TABS: 500.0000 mg | ORAL_TABLET | Freq: Two times a day (BID) | ORAL | 1 refills | Status: DC

## 2018-06-19 NOTE — Progress Notes (Signed)

## 2018-06-24 ENCOUNTER — Telehealth: Payer: Commercial Managed Care - PPO | Admitting: Nurse Practitioner

## 2018-06-24 DIAGNOSIS — M5441 Lumbago with sciatica, right side: Secondary | ICD-10-CM

## 2018-06-24 MED ORDER — MELOXICAM 15 MG PO TABS: 15.0000 mg | ORAL_TABLET | Freq: Every day | ORAL | 3 refills | Status: DC

## 2018-06-24 MED ORDER — CYCLOBENZAPRINE HCL 10 MG PO TABS: 10.0000 mg | ORAL_TABLET | Freq: Three times a day (TID) | ORAL | 1 refills | Status: DC | PRN

## 2018-06-24 NOTE — Progress Notes (Signed)

## 2018-06-29 ENCOUNTER — Telehealth: Payer: Self-pay

## 2018-06-29 NOTE — Telephone Encounter (Signed)
Yes thanks- may come in for wart treatment- if he has a home mask- encourage him to wear it. We have limited supplies here but masks may prevent him from spreading covid 19 if he is an asymptomatic carrier. If he doesn't have a mask- no problem

## 2018-06-29 NOTE — Telephone Encounter (Signed)
Per front office staff, Pt called and wanted to know if he could be seen. Pt has a a wart on his finger that Dr. Yong Channel is working on and wants to know if he can have this done again. No F/C/C No travel or contact with anyone who has traveled.

## 2018-06-29 NOTE — Telephone Encounter (Signed)
Pt schedule for 06/30/2018.

## 2018-06-30 ENCOUNTER — Ambulatory Visit (INDEPENDENT_AMBULATORY_CARE_PROVIDER_SITE_OTHER): Payer: Commercial Managed Care - PPO | Admitting: Family Medicine

## 2018-06-30 ENCOUNTER — Encounter: Payer: Self-pay | Admitting: Family Medicine

## 2018-06-30 VITALS — Ht 75.0 in | Wt 295.0 lb

## 2018-06-30 DIAGNOSIS — B079 Viral wart, unspecified: Secondary | ICD-10-CM

## 2018-06-30 NOTE — Progress Notes (Signed)
Phone [phone]   Subjective:  Robert [name] is a 43 y.o. year old very pleasant male patient who presents for/with See problem oriented charting ROS- no fever, chills, cough. No unintentional weight loss   Past Medical History-  Patient Active Problem List   Diagnosis Date Noted  . 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 Depression, major 09/27/2014    Priority: Medium  . 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 Anxiety state 01/11/2014    Priority: Medium  . Obesity (BMI 35.0-39.9 without comorbidity) 01/11/2014    Priority: Medium  . ADHD (attention deficit hyperactivity disorder) 06/02/2011    Priority: Medium  . Wart 02/27/2015    Priority: Low  . Volar plate injury of finger 01/09/2015    Priority: Low  . Rash 09/27/2014    Priority: Low  . Erectile dysfunction 09/27/2014    Priority: Low  . Vitamin D deficiency 07/23/2014    Priority: Low  . S/P partial thyroidectomy 02/28/2014    Priority: Low  . Cyst of mandible 12/22/2013    Priority: Low  . Hyperlipidemia, unspecified 10/22/2017  . Lumbar back pain with radiculopathy affecting right lower extremity 08/29/2016    Medications- reviewed and updated Current Outpatient Medications  Medication Sig Dispense Refill  . amphetamine-dextroamphetamine (ADDERALL XR) 30 MG 24 hr capsule     . ARMOUR THYROID 120 MG tablet Take 1 tablet by mouth daily.    . cyclobenzaprine (FLEXERIL) 10 MG tablet Take 1 tablet (10 mg total) by mouth 3 (three) times daily as needed for muscle spasms. 30 tablet 1  . meloxicam (MOBIC) 15 MG tablet Take 1 tablet (15 mg total) by mouth daily. 30 tablet 3  . MYDAYIS 37.5 MG CP24     . oxybutynin (DITROPAN) 5 MG tablet TAKE 1 TABLET BY MOUTH EVERYDAY AT BEDTIME    . SUMAtriptan (IMITREX) 50 MG tablet Take 1 tablet (50 mg total) by mouth every 2 (two) hours as needed for migraine. May repeat in 2 hours if headache persists or recurs. 90 tablet 3   No  current facility-administered medications for this visit.      Objective:  Ht 6\' 3"  (1.905 m)   Wt 295 lb (133.8 kg)   BMI 36.87 kg/m  Gen: NAD, resting comfortably CV: RRR  Lungs: nonlabored, normal respiratory rate Abdomen: soft/nondistended Skin: warm, dry, right ring finger near palmar DIP joint th- 4 x 4 mm slightly raised verruca like papules Gets out of car without difficulty  Procedure note: Benefits and risks verbally discussed with patient 10 second freeze thaw cycle of cryotherapy performed  with liquid nitrogen to wart on palmar surface of right 4th finger No complications.  Patient tolerated the procedure well other than mild pain. Offered handout on this from [facility]- patient declined but we discussed home care [email or web address]     Assessment and Plan   Viral warts S: Wart has been present for at least 3-4 months now. On right ring finger. Similar to spot from 2016.   Apparently had cryotherapy at an office back in January. Then the area seemed to almost completely go away but now grown back. Not bigger than it was before. Denies pain with it. Never bleeds. Does not itch A/P: recurrent wart seemingly in same area from 2016- treated with cryotherapy with 10 second cycle today since recurrence from January at an office. Patient knows to return in 3-4 weeks if doesn't resolve and also gave return precautions  for complications (would do at his car again to try to reduce any potential covid 19 asymptomatic transmission)  Lab/Order associations: Viral warts, unspecified type  Return precautions advised.  Garret Reddish, MD

## 2018-06-30 NOTE — Patient Instructions (Addendum)
Drive up visit

## 2018-07-04 ENCOUNTER — Ambulatory Visit (INDEPENDENT_AMBULATORY_CARE_PROVIDER_SITE_OTHER): Payer: Commercial Managed Care - PPO | Admitting: Family Medicine

## 2018-07-04 ENCOUNTER — Encounter: Payer: Self-pay | Admitting: Family Medicine

## 2018-07-04 ENCOUNTER — Other Ambulatory Visit: Payer: Self-pay

## 2018-07-04 ENCOUNTER — Ambulatory Visit: Payer: Self-pay

## 2018-07-04 VITALS — BP 158/90 | HR 76 | Ht 75.0 in | Wt 308.0 lb

## 2018-07-04 DIAGNOSIS — G5631 Lesion of radial nerve, right upper limb: Secondary | ICD-10-CM | POA: Diagnosis not present

## 2018-07-04 DIAGNOSIS — M25521 Pain in right elbow: Secondary | ICD-10-CM | POA: Diagnosis not present

## 2018-07-04 MED ORDER — DICLOFENAC SODIUM 2 % TD SOLN: 2.0000 g | Freq: Two times a day (BID) | TRANSDERMAL | 3 refills | Status: DC

## 2018-07-04 MED ORDER — TIZANIDINE HCL 4 MG PO TABS: 4.0000 mg | ORAL_TABLET | Freq: Four times a day (QID) | ORAL | 0 refills | Status: DC | PRN

## 2018-07-04 MED ORDER — GABAPENTIN 100 MG PO CAPS: 200.0000 mg | ORAL_CAPSULE | Freq: Every day | ORAL | 3 refills | Status: DC

## 2018-07-04 NOTE — Assessment & Plan Note (Signed)
Radial nerve entrapment, injection today, muscle relaxer and gabapentin and topical anti-inflammatories prescribed.  Discussed not taking the meloxicam and the ibuprofen at the same time anymore.  Discussed avoiding significant wrist extension.  Follow-up again 4 weeks

## 2018-07-04 NOTE — Progress Notes (Signed)
[facility] [address] [place], [place] [address] Phone: [phone] Subjective:   I Robert [name] am serving as a Education administrator for Dr. Hulan Saas.  I'm seeing this patient by the request  of:    CC: Right elbow pain  [id number]  Robert [name] is a 43 y.o. male coming in with complaint of elbow pain. Swinging a Teacher, English as a foreign language. Heard and felt a pop. Numbness and tingling   Onset- 1 month Location- lateral proximal radius   Character- sharp, burning, achy  Aggravating factors-  Reliving factors- ice, heat, oral, topical, E-stim Therapies tried-  Severity-7 out of 10     Past Medical History:  Diagnosis Date  . ADD (attention deficit disorder) without hyperactivity    according to his mother/ work test possible  . Chronic sinusitis   . Headache   . Kidney stones   . Sleep apnea    Past Surgical History:  Procedure Laterality Date  . HERNIA REPAIR     pt. was an infant when this surgery occured  . NASAL SINUS SURGERY    . THYROID SURGERY Right 02/28/2014   DR TEOH    PARTIAL THYROIDECTOMY  . THYROIDECTOMY Right 02/28/2014   Procedure: RIGHT HEMI THYROIDECTOMY;  Surgeon: Ascencion Dike, MD;  Location: [facility] OR;  Service: ENT;  Laterality: Right;   Social History   Socioeconomic History  . Marital status: Married    Spouse name: Not on file  . Number of children: Not on file  . Years of education: Not on file  . Highest education level: Not on file  Occupational History  . Not on file  Social Needs  . Financial resource strain: Not on file  . Food insecurity:    Worry: Not on file    Inability: Not on file  . Transportation needs:    Medical: Not on file    Non-medical: Not on file  Tobacco Use  . Smoking status: Never Smoker  . Smokeless tobacco: Never Used  Substance and Sexual Activity  . Alcohol use: Yes    Comment: social  . Drug use: No  . Sexual activity: Yes  Lifestyle  . Physical activity:    Days per week: Not on  file    Minutes per session: Not on file  . Stress: Not on file  Relationships  . Social connections:    Talks on phone: Not on file    Gets together: Not on file    Attends religious service: Not on file    Active member of club or organization: Not on file    Attends meetings of clubs or organizations: Not on file    Relationship status: Not on file  Other Topics Concern  . Not on file  Social History Narrative   Married 2016. No kids.        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      HH of 1 pets dog and cats.   Allergies  Allergen Reactions  . Penicillins Anaphylaxis  . Milk-Related Compounds Other (See Comments)    GI issues   . Topamax Other (See Comments)    Is allergic when taking while drinking alcohol   Family History  Problem Relation Age of Onset  . Hyperlipidemia Mother   . Hypertension Mother   . Thyroid disease Neg Hx     Current Outpatient Medications (Endocrine & Metabolic):  .  ARMOUR THYROID 120 MG tablet, Take 1 tablet by mouth daily.    Current Outpatient Medications (Analgesics):  .  meloxicam (MOBIC) 15 MG tablet, Take 1 tablet (15 mg total) by mouth daily. .  SUMAtriptan (IMITREX) 50 MG tablet, Take 1 tablet (50 mg total) by mouth every 2 (two) hours as needed for migraine. May repeat in 2 hours if headache persists or recurs.   Current Outpatient Medications (Other):  .  amphetamine-dextroamphetamine (ADDERALL XR) 30 MG 24 hr capsule,  .  cyclobenzaprine (FLEXERIL) 10 MG tablet, Take 1 tablet (10 mg total) by mouth 3 (three) times daily as needed for muscle spasms. Marland Kitchen  MYDAYIS 37.5 MG CP24,  .  oxybutynin (DITROPAN) 5 MG tablet, TAKE 1 TABLET BY MOUTH EVERYDAY AT BEDTIME .  Diclofenac Sodium (PENNSAID) 2 % SOLN, Place 2 g onto the skin 2 (two) times daily. Marland Kitchen  gabapentin (NEURONTIN) 100 MG capsule, Take 2 capsules (200 mg total) by mouth at bedtime. Marland Kitchen  tiZANidine  (ZANAFLEX) 4 MG tablet, Take 1 tablet (4 mg total) by mouth every 6 (six) hours as needed for muscle spasms.    Past medical history, social, surgical and family history all reviewed in electronic medical record.  No pertanent information unless stated regarding to the chief complaint.   Review of Systems:  No headache, visual changes, nausea, vomiting, diarrhea, constipation, dizziness, abdominal pain, skin rash, fevers, chills, night sweats, weight loss, swollen lymph nodes, body aches, joint swelling, chest pain, shortness of breath, mood changes.  Mild positive muscle aches, negative recently  Objective  Blood pressure (!) 158/90, pulse 76, height 6\' 3"  (1.905 m), weight (!) 308 lb (139.7 kg), SpO2 96 %.    General: No apparent distress alert and oriented x3 mood and affect normal, dressed appropriately.  HEENT: Pupils equal, extraocular movements intact  Respiratory: Patient's speak in full sentences and does not appear short of breath  Cardiovascular: No lower extremity edema, non tender, no erythema  Skin: Warm dry intact with no signs of infection or rash on extremities or on axial skeleton.  Abdomen: Soft nontender  Neuro: Cranial nerves II through XII are intact, neurovascularly intact in all extremities with 2+ DTRs and 2+ pulses.  Lymph: No lymphadenopathy of posterior or anterior cervical chain or axillae bilaterally.  Gait normal with good balance and coordination.  MSK:  Non tender with full range of motion and good stability and symmetric strength and tone of shoulders, wrist, hip, knee and ankles bilaterally.   Elbow: Right Unremarkable to inspection. Range of motion is full but patient is tender to palpation just distal to the lateral epicondylar region. Mild weakness with wrist extension 4 out of 5 on the right Ulnar nerve does not sublux. Negative cubital tunnel Tinel's.   Limited musculoskeletal ultrasound was performed and interpreted by Lyndal Pulley  Limited  musculoskeletal ultrasound of patient's lateral epicondylar region does not show any hypoechoic changes and no true tearing noted.  Patient's of radial nerve significant enlargement with inflammation noted. Impression: Radial nerve irritation possible entrapment  Procedure: Real-time Ultrasound Guided Injection of right radial nerve Device: GE Logiq Q7 Ultrasound guided injection is preferred based studies that show increased duration, increased effect, greater accuracy, decreased procedural pain, increased response rate, and decreased cost with ultrasound guided versus blind injection.  Verbal informed consent obtained.  Time-out conducted.  Noted no overlying erythema, induration, or other signs of local infection.  Skin prepped in a sterile fashion.  Local anesthesia: Topical Ethyl chloride.  With sterile technique and under real time ultrasound guidance: With a 21-gauge 2 inch needle injected with 0.5 cc of 0.5% Marcaine and 0.5 cc of Kenalog 40 mg/mL into the nerve sheath. Completed without difficulty  Pain immediately resolved suggesting accurate placement of the medication.  Advised to call if fevers/chills, erythema, induration, drainage, or persistent bleeding.  Images permanently stored and available for review in the ultrasound unit.  Impression: Technically successful ultrasound guided injection.    Impression and Recommendations:     This case required medical decision making of moderate complexity. The above documentation has been reviewed and is accurate and complete Lyndal Pulley, DO       Note: This dictation was prepared with Dragon dictation along with smaller phrase technology. Any transcriptional errors that result from this process are unintentional.

## 2018-07-04 NOTE — Patient Instructions (Signed)
Good to see you  Injected the radial nerve and really hope it helps a lot  pennsaid pinkie amount topically 2 times daily as needed.  Gabapentin 200mg  at night Pick either the meloxicam or the ibuprofen for pain during the day  zanaflex up to 3 times a day as a muscle relaxer  Try to avoid wrist extension  See me again in 4ish weeks

## 2018-07-05 ENCOUNTER — Encounter: Payer: Self-pay | Admitting: Family Medicine

## 2018-07-25 ENCOUNTER — Encounter: Payer: Self-pay | Admitting: Family Medicine

## 2018-07-30 NOTE — Progress Notes (Signed)
[facility] [address] [place], [place] [address] Phone: [phone] Subjective:   I Robert [name] am serving as a Education administrator for Dr. Hulan Saas.   CC: Elbow and hand pain follow-up  [id number]  Robert [name] is a 43 y.o. male coming in with complaint of elbow pain. States that his elbow is doing better. Now just the first finger is numb. No pain. Has rebuilt his deck recently but has no pain in his elbow.     Severity-7 out of 10 Patient was seen previously and given a radial nerve injection back on July 04, 2018.    Past Medical History:  Diagnosis Date  . ADD (attention deficit disorder) without hyperactivity    according to his mother/ work test possible  . Chronic sinusitis   . Headache   . Kidney stones   . Sleep apnea    Past Surgical History:  Procedure Laterality Date  . HERNIA REPAIR     pt. was an infant when this surgery occured  . NASAL SINUS SURGERY    . THYROID SURGERY Right 02/28/2014   DR TEOH    PARTIAL THYROIDECTOMY  . THYROIDECTOMY Right 02/28/2014   Procedure: RIGHT HEMI THYROIDECTOMY;  Surgeon: Ascencion Dike, MD;  Location: [facility] OR;  Service: ENT;  Laterality: Right;   Social History   Socioeconomic History  . Marital status: Married    Spouse name: Not on file  . Number of children: Not on file  . Years of education: Not on file  . Highest education level: Not on file  Occupational History  . Not on file  Social Needs  . Financial resource strain: Not on file  . Food insecurity:    Worry: Not on file    Inability: Not on file  . Transportation needs:    Medical: Not on file    Non-medical: Not on file  Tobacco Use  . Smoking status: Never Smoker  . Smokeless tobacco: Never Used  Substance and Sexual Activity  . Alcohol use: Yes    Comment: social  . Drug use: No  . Sexual activity: Yes  Lifestyle  . Physical activity:    Days per week: Not on file    Minutes per session: Not on file  . Stress:  Not on file  Relationships  . Social connections:    Talks on phone: Not on file    Gets together: Not on file    Attends religious service: Not on file    Active member of club or organization: Not on file    Attends meetings of clubs or organizations: Not on file    Relationship status: Not on file  Other Topics Concern  . Not on file  Social History Narrative   Married 2016. No kids.        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      HH of 1 pets dog and cats.   Allergies  Allergen Reactions  . Penicillins Anaphylaxis  . Milk-Related Compounds Other (See Comments)    GI issues   . Topamax Other (See Comments)    Is allergic when taking while drinking alcohol   Family History  Problem Relation Age of Onset  . Hyperlipidemia Mother   . Hypertension Mother   . Thyroid disease Neg Hx     Current Outpatient Medications (Endocrine & Metabolic):  Marland Kitchen  ARMOUR THYROID 120 MG  tablet, Take 1 tablet by mouth daily.    Current Outpatient Medications (Analgesics):  .  meloxicam (MOBIC) 15 MG tablet, Take 1 tablet (15 mg total) by mouth daily. .  SUMAtriptan (IMITREX) 50 MG tablet, Take 1 tablet (50 mg total) by mouth every 2 (two) hours as needed for migraine. May repeat in 2 hours if headache persists or recurs.   Current Outpatient Medications (Other):  .  amphetamine-dextroamphetamine (ADDERALL XR) 30 MG 24 hr capsule,  .  cyclobenzaprine (FLEXERIL) 10 MG tablet, Take 1 tablet (10 mg total) by mouth 3 (three) times daily as needed for muscle spasms. .  Diclofenac Sodium (PENNSAID) 2 % SOLN, Place 2 g onto the skin 2 (two) times daily. Marland Kitchen  gabapentin (NEURONTIN) 100 MG capsule, Take 2 capsules (200 mg total) by mouth 3 (three) times daily. Marland Kitchen  MYDAYIS 37.5 MG CP24,  .  oxybutynin (DITROPAN) 5 MG tablet, TAKE 1 TABLET BY MOUTH EVERYDAY AT BEDTIME .  tiZANidine (ZANAFLEX) 4 MG tablet, Take 1 tablet (4 mg  total) by mouth every 6 (six) hours as needed for muscle spasms.    Past medical history, social, surgical and family history all reviewed in electronic medical record.  No pertanent information unless stated regarding to the chief complaint.   Review of Systems:  No headache, visual changes, nausea, vomiting, diarrhea, constipation, dizziness, abdominal pain, skin rash, fevers, chills, night sweats, weight loss, swollen lymph nodes, body aches, joint swelling, chest pain, shortness of breath, mood changes.  Positive muscle aches  Objective  Blood pressure (!) 150/90, pulse 77, height 6\' 3"  (1.905 m), weight (!) 301 lb (136.5 kg), SpO2 97 %.    General: No apparent distress alert and oriented x3 mood and affect normal, dressed appropriately.  HEENT: Pupils equal, extraocular movements intact  Respiratory: Patient's speak in full sentences and does not appear short of breath  Cardiovascular: No lower extremity edema, non tender, no erythema  Skin: Warm dry intact with no signs of infection or rash on extremities or on axial skeleton.  Abdomen: Soft nontender  Neuro: Cranial nerves II through XII are intact, neurovascularly intact in all extremities with 2+ DTRs and 2+ pulses.  Lymph: No lymphadenopathy of posterior or anterior cervical chain or axillae bilaterally.  Gait normal with good balance and coordination.  MSK:  Non tender with full range of motion and good stability and symmetric strength and tone of , , wrist, hip, knee and ankles bilaterally.   Elbow exam is completely unremarkable.  Neck exam shows the patient does have loss of lordosis.  Patient does have a positive Spurling's with radicular symptoms in the C6 distribution.  4+ out of 5 strength of the C6 distribution on the right compared to the left  [id number]; 15 additional minutes spent for Therapeutic exercises as stated in above notes.  This included exercises focusing on stretching, strengthening, with significant focus on  eccentric aspects.   Long term goals include an improvement in range of motion, strength, endurance as well as avoiding reinjury. Patient's frequency would include in 1-2 times a day, 3-5 times a week for a duration of 6-12 weeks. Exercises that included:  Basic scapular stabilization to include adduction and depression of scapula Scaption, focusing on proper movement and good control Internal and External rotation utilizing a theraband, with elbow tucked at side entire time Rows with theraband which was given   Proper technique shown and discussed handout in great detail with ATC.  All questions were discussed and answered.  Impression and Recommendations:     This case required medical decision making of moderate complexity. The above documentation has been reviewed and is accurate and complete Lyndal Pulley, DO       Note: This dictation was prepared with Dragon dictation along with smaller phrase technology. Any transcriptional errors that result from this process are unintentional.

## 2018-08-01 ENCOUNTER — Encounter: Payer: Self-pay | Admitting: Family Medicine

## 2018-08-01 ENCOUNTER — Ambulatory Visit: Payer: Commercial Managed Care - PPO | Admitting: Family Medicine

## 2018-08-01 ENCOUNTER — Other Ambulatory Visit: Payer: Self-pay

## 2018-08-01 DIAGNOSIS — M5412 Radiculopathy, cervical region: Secondary | ICD-10-CM | POA: Diagnosis not present

## 2018-08-01 MED ORDER — GABAPENTIN 100 MG PO CAPS: 200.0000 mg | ORAL_CAPSULE | Freq: Three times a day (TID) | ORAL | 3 refills | Status: DC

## 2018-08-01 NOTE — Patient Instructions (Signed)
Good to see you  Ice 20 minutes 2 times daily. Usually after activity and before bed. Exercises 3 times a week.  Keep hands within peripheral vision  Gabapentin 200mg  in AM and 300mg  at night B6 100mg  daily  See me again in 4 weeks if not perfect

## 2018-08-01 NOTE — Assessment & Plan Note (Signed)
Radicular symptoms.  Start gabapentin.  Increase dosing.  We discussed icing regimen and home exercises posture and ergonomics.  Patient will try to make these changes and come back again in 4 weeks.

## 2018-08-24 ENCOUNTER — Encounter: Payer: Self-pay | Admitting: Family Medicine

## 2018-09-20 ENCOUNTER — Encounter: Payer: Self-pay | Admitting: Family Medicine

## 2018-10-28 ENCOUNTER — Ambulatory Visit (INDEPENDENT_AMBULATORY_CARE_PROVIDER_SITE_OTHER): Payer: Commercial Managed Care - PPO | Admitting: Physician Assistant

## 2018-10-28 ENCOUNTER — Encounter: Payer: Self-pay | Admitting: Physician Assistant

## 2018-10-28 DIAGNOSIS — F411 Generalized anxiety disorder: Secondary | ICD-10-CM

## 2018-10-28 DIAGNOSIS — N5201 Erectile dysfunction due to arterial insufficiency: Secondary | ICD-10-CM | POA: Diagnosis not present

## 2018-10-28 DIAGNOSIS — F325 Major depressive disorder, single episode, in full remission: Secondary | ICD-10-CM

## 2018-10-28 MED ORDER — LORAZEPAM 0.5 MG PO TABS: 0.5000 mg | ORAL_TABLET | Freq: Two times a day (BID) | ORAL | 0 refills | Status: DC | PRN

## 2018-10-28 MED ORDER — TADALAFIL 20 MG PO TABS: 10.0000 mg | ORAL_TABLET | ORAL | 2 refills | Status: AC | PRN

## 2018-10-28 NOTE — Progress Notes (Signed)
Virtual Visit via Video   I connected with Robert [name] on 10/28/18 at  9:40 AM EDT by a video enabled telemedicine application and verified that I am speaking with the correct person using two identifiers. Location patient: Home Location provider: [facility], Office Persons participating in the virtual visit: Tiger De Hollingshead, [employer].  I discussed the limitations of evaluation and management by telemedicine and the availability of in person appointments. The patient expressed understanding and agreed to proceed.  I acted as a Education administrator for [employer], [employer], LPN  Subjective:   HPI:   Anxiety/Depression Pt c/o anxiety and depression for the past 2 months. He said his mother fell and broke her hip, he recently discovered that she is have issues with alcohol abuse and he is trying to mentally and physically take care of her. He and his sister are sharing the caregiver burden. He has been on in the past Xanax 0.25 mg and helped with issue. Denies SI/HI. Currently has a 17 month old. Having some issues falling asleep.  He denies any personal issues with addiction or substance abuse.  GAD 7 : Generalized Anxiety Score 10/28/2018  Nervous, Anxious, on Edge 3  Control/stop worrying 3  Worry too much - different things 3  Trouble relaxing 3  Restless 0  Easily annoyed or irritable 3  Afraid - awful might happen 1  Total GAD 7 Score 16  Anxiety Difficulty Somewhat difficult     Office Visit from 10/28/2018 in [place]  PHQ-9 Total Score  9     Libido issue Pt c/o libido issues x 3 months. Pt was on Cialis 3 yrs ago and worked well. Pt is going on vacation in August and would like something to help while on vacation. He feels that this is very situational and will likely resolve as his anxiety is improved.  ROS: See pertinent positives and negatives per HPI.  Patient Active Problem List   Diagnosis Date Noted  . Cervical  radiculopathy at C6 08/01/2018  . Radial nerve entrapment, right 07/04/2018  . Hyperlipidemia, unspecified 10/22/2017  . Secondary polycythemia 09/09/2017  . Lumbar back pain with radiculopathy affecting right lower extremity 08/29/2016  . Wart 02/27/2015  . Volar plate injury of finger 01/09/2015  . Depression, major 09/27/2014  . Rash 09/27/2014  . Erectile dysfunction 09/27/2014  . Vitamin D deficiency 07/23/2014  . 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 S/P partial thyroidectomy 02/28/2014  . Anxiety state 01/11/2014  . Obesity (BMI 35.0-39.9 without comorbidity) 01/11/2014  . Cyst of mandible 12/22/2013  . ADHD (attention deficit hyperactivity disorder) 06/02/2011    Social History   Tobacco Use  . Smoking status: Never Smoker  . Smokeless tobacco: Never Used  Substance Use Topics  . Alcohol use: Yes    Comment: social    Current Outpatient Medications:  .  amphetamine-dextroamphetamine (ADDERALL XR) 30 MG 24 hr capsule, , Disp: , Rfl:  .  ARMOUR THYROID 120 MG tablet, Take 1 tablet by mouth daily., Disp: , Rfl:  .  cyclobenzaprine (FLEXERIL) 10 MG tablet, Take 1 tablet (10 mg total) by mouth 3 (three) times daily as needed for muscle spasms., Disp: 30 tablet, Rfl: 1 .  Diclofenac Sodium (PENNSAID) 2 % SOLN, Place 2 g onto the skin 2 (two) times daily., Disp: 112 g, Rfl: 3 .  gabapentin (NEURONTIN) 100 MG capsule, Take 2 capsules (200 mg total) by mouth 3 (three) times daily., Disp: 270 capsule,  Rfl: 3 .  meloxicam (MOBIC) 15 MG tablet, Take 1 tablet (15 mg total) by mouth daily., Disp: 30 tablet, Rfl: 3 .  MYDAYIS 37.5 MG CP24, Take 1 capsule by mouth daily. , Disp: , Rfl:  .  oxybutynin (DITROPAN) 5 MG tablet, TAKE 1 TABLET BY MOUTH EVERYDAY AT BEDTIME, Disp: , Rfl:  .  SUMAtriptan (IMITREX) 50 MG tablet, Take 1 tablet (50 mg total) by mouth every 2 (two) hours as needed for migraine. May repeat in 2 hours if headache persists or recurs., Disp: 90 tablet, Rfl: 3 .   tiZANidine (ZANAFLEX) 4 MG tablet, Take 1 tablet (4 mg total) by mouth every 6 (six) hours as needed for muscle spasms., Disp: 30 tablet, Rfl: 0 .  LORazepam (ATIVAN) 0.5 MG tablet, Take 1 tablet (0.5 mg total) by mouth 2 (two) times daily as needed for anxiety., Disp: 20 tablet, Rfl: 0 .  tadalafil (CIALIS) 20 MG tablet, Take 0.5-1 tablets (10-20 mg total) by mouth every other day as needed for erectile dysfunction., Disp: 5 tablet, Rfl: 2  Allergies  Allergen Reactions  . Penicillins Anaphylaxis  . Milk-Related Compounds Other (See Comments)    GI issues   . Topamax Other (See Comments)    Is allergic when taking while drinking alcohol    Objective:   VITALS: Per patient if applicable, see vitals. GENERAL: Alert, appears well and in no acute distress. HEENT: Atraumatic, conjunctiva clear, no obvious abnormalities on inspection of external nose and ears. NECK: Normal movements of the head and neck. CARDIOPULMONARY: No increased WOB. Speaking in clear sentences. I:E ratio WNL.  MS: Moves all visible extremities without noticeable abnormality. PSYCH: Pleasant and cooperative, well-groomed. Speech normal rate and rhythm. Affect is appropriate. Insight and judgement are appropriate. Attention is focused, linear, and appropriate.  NEURO: CN grossly intact. Oriented as arrived to appointment on time with no prompting. Moves both UE equally.  SKIN: No obvious lesions, wounds, erythema, or cyanosis noted on face or hands.  Assessment and Plan:   Blade was seen today for anxiety, depression and libido issue.  Diagnoses and all orders for this visit:  Erectile dysfunction due to arterial insufficiency Refill on cialis provided. He has taken this in the past.  Anxiety state; Major depressive disorder with single episode, in full remission ([place]) No SI/HI. Issues are very situational at this time. I have provided short script of ativan. Discussed that this should not be used with alcohol. If  he feels that this medication is ineffective or if he needs refill, I advised him to follow-up with his PCP.  Other orders -     tadalafil (CIALIS) 20 MG tablet; Take 0.5-1 tablets (10-20 mg total) by mouth every other day as needed for erectile dysfunction. -     LORazepam (ATIVAN) 0.5 MG tablet; Take 1 tablet (0.5 mg total) by mouth 2 (two) times daily as needed for anxiety.  . Reviewed expectations re: course of current medical issues. . Discussed self-management of symptoms. . Outlined signs and symptoms indicating need for more acute intervention. . Patient verbalized understanding and all questions were answered. Marland Kitchen Health Maintenance issues including appropriate healthy diet, exercise, and smoking avoidance were discussed with patient. . See orders for this visit as documented in the electronic medical record.  I discussed the assessment and treatment plan with the patient. The patient was provided an opportunity to ask questions and all were answered. The patient agreed with the plan and demonstrated an understanding of the instructions.  The patient was advised to call back or seek an in-person evaluation if the symptoms worsen or if the condition fails to improve as anticipated.   CMA or LPN served as scribe during this visit. History, Physical, and Plan performed by medical provider. The above documentation has been reviewed and is accurate and complete.  [place], [place] 10/28/2018

## 2019-01-09 ENCOUNTER — Telehealth: Payer: Self-pay | Admitting: Family Medicine

## 2019-01-09 ENCOUNTER — Other Ambulatory Visit: Payer: Self-pay | Admitting: Physician Assistant

## 2019-01-09 MED ORDER — LORAZEPAM 0.5 MG PO TABS: 0.5000 mg | ORAL_TABLET | Freq: Two times a day (BID) | ORAL | 0 refills | Status: DC | PRN

## 2019-01-09 NOTE — Telephone Encounter (Signed)
See below

## 2019-01-09 NOTE — Telephone Encounter (Signed)
I refilled this but needs to keep visit on the 23rd to discuss.  Please make sure he is not driving at least 8 hours after taking medication

## 2019-01-09 NOTE — Telephone Encounter (Signed)
See note

## 2019-01-09 NOTE — Telephone Encounter (Signed)
Pt request refill  LORazepam (ATIVAN) 0.5 MG tablet  samantha prescribed in July, and Dr Yong Channel has not ever prescribed .  Pt made appt for 10/23.  But would like refill now, or should he see another provider until he can see Dr Yong Channel?   [id number] [address] [place]

## 2019-01-10 NOTE — Telephone Encounter (Signed)
Pt called and notified of the below.

## 2019-01-20 ENCOUNTER — Encounter: Payer: Self-pay | Admitting: Family Medicine

## 2019-01-20 ENCOUNTER — Ambulatory Visit: Payer: Commercial Managed Care - PPO | Admitting: Family Medicine

## 2019-01-20 ENCOUNTER — Other Ambulatory Visit: Payer: Self-pay

## 2019-01-20 DIAGNOSIS — R0981 Nasal congestion: Secondary | ICD-10-CM

## 2019-01-20 DIAGNOSIS — F439 Reaction to severe stress, unspecified: Secondary | ICD-10-CM

## 2019-01-20 DIAGNOSIS — F411 Generalized anxiety disorder: Secondary | ICD-10-CM

## 2019-01-20 MED ORDER — LORAZEPAM 0.5 MG PO TABS: 0.5000 mg | ORAL_TABLET | Freq: Two times a day (BID) | ORAL | 0 refills | Status: DC | PRN
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
## 2019-01-21 NOTE — Patient Instructions (Signed)
There are no preventive care reminders to display for this patient.  - Flu shot today - High dose flu shot today - declines for this season - will complete later in flu season (please let us know if you get this at another location so we can update your chart)

## 2019-02-16 ENCOUNTER — Ambulatory Visit (INDEPENDENT_AMBULATORY_CARE_PROVIDER_SITE_OTHER): Payer: Commercial Managed Care - PPO | Admitting: Family Medicine

## 2019-02-16 ENCOUNTER — Encounter: Payer: Self-pay | Admitting: Family Medicine

## 2019-02-16 ENCOUNTER — Other Ambulatory Visit: Payer: Self-pay

## 2019-02-16 VITALS — BP 148/84 | HR 77 | Ht 75.0 in | Wt 308.0 lb

## 2019-02-16 DIAGNOSIS — G5701 Lesion of sciatic nerve, right lower limb: Secondary | ICD-10-CM

## 2019-02-16 DIAGNOSIS — M5431 Sciatica, right side: Secondary | ICD-10-CM

## 2019-02-16 MED ORDER — PREDNISONE 50 MG PO TABS: 50.0000 mg | ORAL_TABLET | Freq: Every day | ORAL | 0 refills | Status: DC

## 2019-02-16 MED ORDER — GABAPENTIN 300 MG PO CAPS: ORAL_CAPSULE | ORAL | 3 refills | Status: DC

## 2019-02-16 MED ORDER — HYDROCODONE-ACETAMINOPHEN 5-325 MG PO TABS: 1.0000 | ORAL_TABLET | Freq: Four times a day (QID) | ORAL | 0 refills | Status: DC | PRN

## 2019-02-16 NOTE — Patient Instructions (Addendum)
Thank you for coming in today. I think it is likely that this is a pinched nerve in your back (sciatica) or piriformis syndrome.  Take the prednisone for 5 days.  Use gabapentin for nerve pain.  Use hydrocodone sparingly.  Do the exercise we discussed.  Attend PT.    Come back or go to the emergency room if you notice new weakness new numbness problems walking or bowel or bladder problems.  Please perform the exercise program that we have prepared for you and gone over in detail on a daily basis.  In addition to the handout you were provided you can access your program through: [email or web address]   Your unique program code is:  [phone]

## 2019-02-16 NOTE — Progress Notes (Signed)
I, Wendy Poet, LAT, ATC, am serving as scribe for Dr. Lynne Leader.  Robert [name] is a 43 y.o. male who presents to [place] today for c/o R glute pain x 3-4 weeks.  Pt is a pt of Dr. Hulan Saas and was last seen by Dr. Tamala Julian on 08/01/18 for R elbow and finger pain.  Pt's current glute symptoms began about 3-4 weeks ago.  He states that it was getting better until last evening when it flared up again.  He states that he is having radiating pain along the R post leg to the calf.  He states that the pain in his R calf is new as of yesterday.  He reports tingling into his R LE.  He also reports some lower back pain.  Pt denies any change in his symptoms w/ increased intr-abdominal pressure.  Pt has tried Advil, ice, heat and foam rolling.  Aggravating factors include a SLR on the R LE, weight bearing, general movement.    Of note Taha and his family will be traveling to [place] in 2 days and coming back on Thanksgiving day.  ROS:  As above  Exam:  BP (!) 148/84 (BP Location: Left Arm, Patient Position: Sitting, Cuff Size: Large)   Pulse 77   Ht 6\' 3"  (1.905 m)   Wt (!) 308 lb (139.7 kg)   SpO2 97%   BMI 38.50 kg/m  Wt Readings from Last 5 Encounters:  02/16/19 (!) 308 lb (139.7 kg)  08/01/18 (!) 301 lb (136.5 kg)  07/04/18 (!) 308 lb (139.7 kg)  06/30/18 295 lb (133.8 kg)  04/14/18 (!) 300 lb 6.4 oz (136.3 kg)   General: Well Developed, well nourished, and in no acute distress.  Neuro/Psych: Alert and oriented x3, extra-ocular muscles intact, able to move all 4 extremities, sensation grossly intact. Skin: Warm and dry, no rashes noted.  Respiratory: Not using accessory muscles, speaking in full sentences, trachea midline.  Cardiovascular: Pulses palpable, no extremity edema. Abdomen: Does not appear distended. MSK:  L-spine: Nontender to spinal midline.  Nontender paraspinal musculature. Range of motion: Extension limited.  Flexion limited.  Rotation and  lateral flexion normal bilaterally. Low extremity reflexes strength and sensation are equal normal throughout. Positive right side slump test and positive right-sided straight leg raise test.  Right hip: Normal-appearing nontender to palpation in lateral hip anterior hip and posterior hip. Normal hip motion. Normal strength.  Pain is not reproduced with abduction or external rotation strength testing. Negative FABER and FADIR test.  Patient does experience a good/bad stretching sensation with figure 4 stretch and crossover stretch.  Left hip normal-appearing nontender normal motion.   Normal gait.     Assessment and Plan: 43 y.o. male with  Right buttocks and posterior leg pain.  Pain is due to sciatica/piriformis syndrome.  Discussed treatment options.  Plan for home exercise program, and referral to physical therapy.  Additionally will treat with burst of prednisone and trial of gabapentin.  Limited hydrocodone for more severe pain.  Discussed precautions and warning signs or symptoms.  Recheck back if not improving in near future.  Discussed airplane safety with upcoming travel.   PDMP reviewed during this encounter. Orders Placed This Encounter  Procedures  . Ambulatory referral to Physical Therapy    Referral Priority:   Routine    Referral Type:   Physical Medicine    Referral Reason:   Specialty Services Required    Requested Specialty:   Physical Therapy  Meds ordered this encounter  Medications  . gabapentin (NEURONTIN) 300 MG capsule    Sig: One tab PO qHS for a week, then BID for a week, then TID. May double weekly to a max of 3,600mg /day    Dispense:  180 capsule    Refill:  3  . predniSONE (DELTASONE) 50 MG tablet    Sig: Take 1 tablet (50 mg total) by mouth daily.    Dispense:  5 tablet    Refill:  0  . HYDROcodone-acetaminophen (NORCO/VICODIN) 5-325 MG tablet    Sig: Take 1 tablet by mouth every 6 (six) hours as needed.    Dispense:  15 tablet    Refill:   0    Historical information moved to improve visibility of documentation.  Past Medical History:  Diagnosis Date  . ADD (attention deficit disorder) without hyperactivity    according to his mother/ work test possible  . Chronic sinusitis   . Headache   . Kidney stones   . Sleep apnea    Past Surgical History:  Procedure Laterality Date  . HERNIA REPAIR     pt. was an infant when this surgery occured  . NASAL SINUS SURGERY    . THYROID SURGERY Right 02/28/2014   DR TEOH    PARTIAL THYROIDECTOMY  . THYROIDECTOMY Right 02/28/2014   Procedure: RIGHT HEMI THYROIDECTOMY;  Surgeon: Ascencion Dike, MD;  Location: [facility] OR;  Service: ENT;  Laterality: Right;   Social History   Tobacco Use  . Smoking status: Never Smoker  . Smokeless tobacco: Never Used  Substance Use Topics  . Alcohol use: Yes    Comment: social   family history includes Hyperlipidemia in his mother; Hypertension in his mother.  Medications: Current Outpatient Medications  Medication Sig Dispense Refill  . amphetamine-dextroamphetamine (ADDERALL XR) 30 MG 24 hr capsule     . ARMOUR THYROID 120 MG tablet Take 1 tablet by mouth daily.    Marland Kitchen LORazepam (ATIVAN) 0.5 MG tablet Take 1 tablet (0.5 mg total) by mouth 2 (two) times daily as needed for anxiety. 60 tablet 0  . MYDAYIS 37.5 MG CP24 Take 1 capsule by mouth daily.     Marland Kitchen oxybutynin (DITROPAN) 5 MG tablet TAKE 1 TABLET BY MOUTH EVERYDAY AT BEDTIME    . SUMAtriptan (IMITREX) 50 MG tablet Take 1 tablet (50 mg total) by mouth every 2 (two) hours as needed for migraine. May repeat in 2 hours if headache persists or recurs. 90 tablet 3  . tadalafil (CIALIS) 20 MG tablet Take 0.5-1 tablets (10-20 mg total) by mouth every other day as needed for erectile dysfunction. 5 tablet 2  . cyclobenzaprine (FLEXERIL) 10 MG tablet Take 1 tablet (10 mg total) by mouth 3 (three) times daily as needed for muscle spasms. (Patient not taking: Reported on 02/16/2019) 30 tablet 1  . Diclofenac  Sodium (PENNSAID) 2 % SOLN Place 2 g onto the skin 2 (two) times daily. (Patient not taking: Reported on 02/16/2019) 112 g 3  . gabapentin (NEURONTIN) 300 MG capsule One tab PO qHS for a week, then BID for a week, then TID. May double weekly to a max of 3,600mg /day 180 capsule 3  . HYDROcodone-acetaminophen (NORCO/VICODIN) 5-325 MG tablet Take 1 tablet by mouth every 6 (six) hours as needed. 15 tablet 0  . meloxicam (MOBIC) 15 MG tablet Take 1 tablet (15 mg total) by mouth daily. (Patient not taking: Reported on 02/16/2019) 30 tablet 3  . predniSONE (DELTASONE) 50 MG  tablet Take 1 tablet (50 mg total) by mouth daily. 5 tablet 0   No current facility-administered medications for this visit.    Allergies  Allergen Reactions  . Penicillins Anaphylaxis  . Milk-Related Compounds Other (See Comments)    GI issues   . Topamax Other (See Comments)    Is allergic when taking while drinking alcohol      Discussed warning signs or symptoms. Please see discharge instructions. Patient expresses understanding.  The above documentation has been reviewed and is accurate and complete Lynne Leader

## 2019-02-28 ENCOUNTER — Ambulatory Visit: Payer: Commercial Managed Care - PPO | Admitting: Physical Therapy

## 2019-03-01 ENCOUNTER — Ambulatory Visit (INDEPENDENT_AMBULATORY_CARE_PROVIDER_SITE_OTHER): Payer: Commercial Managed Care - PPO | Admitting: Physical Therapy

## 2019-03-01 ENCOUNTER — Other Ambulatory Visit: Payer: Self-pay

## 2019-03-01 DIAGNOSIS — M5416 Radiculopathy, lumbar region: Secondary | ICD-10-CM | POA: Diagnosis not present

## 2019-03-02 ENCOUNTER — Other Ambulatory Visit: Payer: Self-pay

## 2019-03-03 ENCOUNTER — Encounter: Payer: Self-pay | Admitting: Family Medicine

## 2019-03-03 ENCOUNTER — Ambulatory Visit (INDEPENDENT_AMBULATORY_CARE_PROVIDER_SITE_OTHER): Payer: Commercial Managed Care - PPO | Admitting: Family Medicine

## 2019-03-03 VITALS — BP 140/78 | HR 84 | Temp 97.6°F | Ht 75.0 in | Wt 302.2 lb

## 2019-03-03 DIAGNOSIS — E559 Vitamin D deficiency, unspecified: Secondary | ICD-10-CM | POA: Diagnosis not present

## 2019-03-03 DIAGNOSIS — E890 Postprocedural hypothyroidism: Secondary | ICD-10-CM

## 2019-03-03 DIAGNOSIS — F900 Attention-deficit hyperactivity disorder, predominantly inattentive type: Secondary | ICD-10-CM

## 2019-03-03 DIAGNOSIS — Z0000 Encounter for general adult medical examination without abnormal findings: Secondary | ICD-10-CM

## 2019-03-03 DIAGNOSIS — F411 Generalized anxiety disorder: Secondary | ICD-10-CM

## 2019-03-03 DIAGNOSIS — F325 Major depressive disorder, single episode, in full remission: Secondary | ICD-10-CM

## 2019-03-03 DIAGNOSIS — E785 Hyperlipidemia, unspecified: Secondary | ICD-10-CM | POA: Diagnosis not present

## 2019-03-03 DIAGNOSIS — D751 Secondary polycythemia: Secondary | ICD-10-CM

## 2019-03-03 DIAGNOSIS — Z1283 Encounter for screening for malignant neoplasm of skin: Secondary | ICD-10-CM

## 2019-03-03 LAB — COMPREHENSIVE METABOLIC PANEL
ALT: 25 U/L (ref 0–53)
AST: 16 U/L (ref 0–37)
Albumin: 4.5 g/dL (ref 3.5–5.2)
Alkaline Phosphatase: 61 U/L (ref 39–117)
BUN: 18 mg/dL (ref 6–23)
CO2: 27 mEq/L (ref 19–32)
Calcium: 9.3 mg/dL (ref 8.4–10.5)
Chloride: 106 mEq/L (ref 96–112)
Creatinine, Ser: 1.11 mg/dL (ref 0.40–1.50)
GFR: 72.97 mL/min (ref >60.00)
Glucose, Bld: 93 mg/dL (ref 70–99)
Potassium: 5.1 mEq/L (ref 3.5–5.1)
Sodium: 141 mEq/L (ref 135–145)
Total Bilirubin: 1 mg/dL (ref 0.2–1.2)
Total Protein: 6.4 g/dL (ref 6.0–8.3)

## 2019-03-03 LAB — LIPID PANEL
Cholesterol: 221 mg/dL — ABNORMAL HIGH (ref 0–200)
HDL: 40 mg/dL (ref >39.00)
LDL Cholesterol: 160 mg/dL — ABNORMAL HIGH (ref 0–99)
NonHDL: 181.07
Total CHOL/HDL Ratio: 6
Triglycerides: 104 mg/dL (ref 0.0–149.0)
VLDL: 20.8 mg/dL (ref 0.0–40.0)

## 2019-03-03 LAB — CBC WITH DIFFERENTIAL/PLATELET
Basophils Absolute: 0 10*3/uL (ref 0.0–0.1)
Basophils Relative: 0.6 % (ref 0.0–3.0)
Eosinophils Absolute: 0 10*3/uL (ref 0.0–0.7)
Eosinophils Relative: 0.6 % (ref 0.0–5.0)
HCT: 53.5 % — ABNORMAL HIGH (ref 39.0–52.0)
Hemoglobin: 17.5 g/dL — ABNORMAL HIGH (ref 13.0–17.0)
Lymphocytes Relative: 27.5 % (ref 12.0–46.0)
Lymphs Abs: 2.2 10*3/uL (ref 0.7–4.0)
MCHC: 32.7 g/dL (ref 30.0–36.0)
MCV: 87.5 fl (ref 78.0–100.0)
Monocytes Absolute: 0.9 10*3/uL (ref 0.1–1.0)
Monocytes Relative: 11.5 % (ref 3.0–12.0)
Neutro Abs: 4.8 10*3/uL (ref 1.4–7.7)
Neutrophils Relative %: 59.8 % (ref 43.0–77.0)
Platelets: 280 10*3/uL (ref 150.0–400.0)
RBC: 6.11 Mil/uL — ABNORMAL HIGH (ref 4.22–5.81)
RDW: 15.3 % (ref 11.5–15.5)
WBC: 8.1 10*3/uL (ref 4.0–10.5)

## 2019-03-03 LAB — TSH: TSH: 1.01 u[IU]/mL (ref 0.35–4.50)

## 2019-03-03 LAB — VITAMIN D 25 HYDROXY (VIT D DEFICIENCY, FRACTURES): VITD: 83.51 ng/mL (ref 30.00–100.00)

## 2019-03-03 NOTE — Progress Notes (Signed)
Phone [phone] Virtual visit via Video note   Subjective:  Patient presents today for their annual physical. Chief complaint-noted.  Chief Complaint  Patient presents with  . Annual Exam    fasting    This visit type was conducted due to national recommendations for restrictions regarding the COVID-19 Pandemic (e.g. social distancing).  This format is felt to be most appropriate for this patient at this time balancing risks to patient and risks to population by having him in for in person visit.  No physical exam was performed (except for noted visual exam or audio findings with Telehealth visits).    Our team/I connected with Robert [name] at  8:20 AM EST by a video enabled telemedicine application (doxy.me or caregility through epic) and verified that I am speaking with the correct person using two identifiers.  Location patient: Home[facility] Location provider: [facility], office Persons participating in the virtual visit:  patient  Our team/I discussed the limitations of evaluation and management by telemedicine and the availability of in person appointments. In light of current covid-19 pandemic, patient also understands that we are trying to protect them by minimizing in office contact if at all possible.  The patient expressed consent for telemedicine visit and agreed to proceed. Patient understands insurance will be billed.   See problem oriented charting- ROS- full  review of systems was completed and negative  except for: Slight headache (eye exam today and may have irritated him  The following were reviewed and entered/updated in epic: Past Medical History:  Diagnosis Date  . ADD (attention deficit disorder) without hyperactivity    according to his mother/ work test possible  . Chronic sinusitis   . Headache   . Kidney stones   . Sleep apnea    Patient Active Problem List   Diagnosis Date Noted  . 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 Depression,  major 09/27/2014    Priority: Medium  . 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 Anxiety state 01/11/2014    Priority: Medium  . Obesity (BMI 35.0-39.9 without comorbidity) 01/11/2014    Priority: Medium  . ADHD (attention deficit hyperactivity disorder) 06/02/2011    Priority: Medium  . Wart 02/27/2015    Priority: Low  . Volar plate injury of finger 01/09/2015    Priority: Low  . Rash 09/27/2014    Priority: Low  . Erectile dysfunction 09/27/2014    Priority: Low  . Vitamin D deficiency 07/23/2014    Priority: Low  . S/P partial thyroidectomy 02/28/2014    Priority: Low  . Cyst of mandible 12/22/2013    Priority: Low  . Cervical radiculopathy at C6 08/01/2018  . Radial nerve entrapment, right 07/04/2018  . Hyperlipidemia, unspecified 10/22/2017  . Lumbar back pain with radiculopathy affecting right lower extremity 08/29/2016   Past Surgical History:  Procedure Laterality Date  . HERNIA REPAIR     pt. was an infant when this surgery occured  . NASAL SINUS SURGERY    . THYROID SURGERY Right 02/28/2014   DR TEOH    PARTIAL THYROIDECTOMY  . THYROIDECTOMY Right 02/28/2014   Procedure: RIGHT HEMI THYROIDECTOMY;  Surgeon: Ascencion Dike, MD;  Location: [facility] OR;  Service: ENT;  Laterality: Right;    Family History  Problem Relation Age of Onset  . Hyperlipidemia Mother   . Hypertension Mother   . Thyroid disease Neg Hx     Medications- reviewed and updated Current Outpatient Medications  Medication  Sig Dispense Refill  . amphetamine-dextroamphetamine (ADDERALL XR) 30 MG 24 hr capsule     . ARMOUR THYROID 120 MG tablet Take 1 tablet by mouth daily.    . cyclobenzaprine (FLEXERIL) 10 MG tablet Take 1 tablet (10 mg total) by mouth 3 (three) times daily as needed for muscle spasms. (Patient not taking: Reported on 02/16/2019) 30 tablet 1  . Diclofenac Sodium (PENNSAID) 2 % SOLN Place 2 g onto the skin 2 (two) times daily. (Patient not taking: Reported on  02/16/2019) 112 g 3  . gabapentin (NEURONTIN) 300 MG capsule One tab PO qHS for a week, then BID for a week, then TID. May double weekly to a max of 3,600mg /day 180 capsule 3  . HYDROcodone-acetaminophen (NORCO/VICODIN) 5-325 MG tablet Take 1 tablet by mouth every 6 (six) hours as needed. 15 tablet 0  . LORazepam (ATIVAN) 0.5 MG tablet Take 1 tablet (0.5 mg total) by mouth 2 (two) times daily as needed for anxiety. 60 tablet 0  . meloxicam (MOBIC) 15 MG tablet Take 1 tablet (15 mg total) by mouth daily. (Patient not taking: Reported on 02/16/2019) 30 tablet 3  . MYDAYIS 37.5 MG CP24 Take 1 capsule by mouth daily.     Marland Kitchen oxybutynin (DITROPAN) 5 MG tablet TAKE 1 TABLET BY MOUTH EVERYDAY AT BEDTIME    . predniSONE (DELTASONE) 50 MG tablet Take 1 tablet (50 mg total) by mouth daily. 5 tablet 0  . SUMAtriptan (IMITREX) 50 MG tablet Take 1 tablet (50 mg total) by mouth every 2 (two) hours as needed for migraine. May repeat in 2 hours if headache persists or recurs. 90 tablet 3  . tadalafil (CIALIS) 20 MG tablet Take 0.5-1 tablets (10-20 mg total) by mouth every other day as needed for erectile dysfunction. 5 tablet 2   No current facility-administered medications for this visit.     Allergies-reviewed and updated Allergies  Allergen Reactions  . Penicillins Anaphylaxis  . Milk-Related Compounds Other (See Comments)    GI issues   . Topamax Other (See Comments)    Is allergic when taking while drinking alcohol    Social History   Social History Narrative   Married 2016. 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      HH of 1 pets dog and cats.     Objective:  BP 140/78   Pulse 84   Temp 97.6 F (36.4 C)   Ht 6\' 3"  (1.905 m)   Wt (!) 302 lb 3.2 oz (137.1 kg)   SpO2 96%   BMI 37.77 kg/m  Constitutional: Conversant, no acute distress Eyes: Anicteric sclera; no lid lag or  proptosis Respiratory: Normal respiratory effort, respiratory rate 16 Cardiovascular: No pitting peripheral edema.   Skin: No rash, lesions or ulcers Musculoskeletal: No digital cyanosis.  Normal gait. Neurological: Normal speech, moves upper extremities without difficulty Psych: Intact judgment and insight.  Alert and oriented x3 with friendly affect.     Assessment and Plan:  43 y.o. male presenting for annual physical.  Health Maintenance counseling: 1. Anticipatory guidance: Patient counseled regarding regular dental exams yes -q6 months, eye exams- yes,  avoiding smoking and second hand smoke , limiting alcohol to 2 beverages per day - social and occasionally .   2. Risk factor reduction:  Advised patient of need for regular exercise and diet rich and fruits and vegetables to reduce  risk of heart attack and stroke. Exercise- wants to improve on this once back and buttocks heals up. Diet-tries to do better recently- wants to lose weight particularly with recent back issues (down 6 lbs from last visit). Trying to cut down on fried foods.  Wt Readings from Last 3 Encounters:  03/03/19 (!) 302 lb 3.2 oz (137.1 kg)  02/16/19 (!) 308 lb (139.7 kg)  08/01/18 (!) 301 lb (136.5 kg)  3. Immunizations/screenings/ancillary studies-declines flu shot  Immunization History  Administered Date(s) Administered  . Td 07/01/2001  . Tdap 12/12/2012  4. Prostate cancer screening-   No family history, start at age 12 . Urologist checked prostate a few months ago  5. Colon cancer screening -  no family history, start at age 70  6. Skin cancer screening/prevention- no dermatologist- would like referral- placed today advised regular sunscreen use. Denies worrisome, changing, or new skin lesions.  7. Testicular cancer screening- advised monthly self exams yes 8. STD screening- patient opts out as monogamous 9.  Never smoker-   Status of chronic or acute concerns   Patient recently seen by Dr. Georgina Snell for  posterior leg and buttocks pain.  Thought to be related to piriformis syndrome.  Home exercise program and PT referral was placed.  He was given prednisone and gabapentin as well as limited hydrocodone for more severe pain. He later traveled to [facility] and unfortunately Pain was so severe while he was in [place] that he had to be hospitalized for 37 hours to control pain- only pain control in hospital. CT scan showed bilateral pars fracture of L5- plan was for MRI so they sent him to a different hospital. MRI showed no fracture but showed bulging disc. He was told would see PT/neurology but that never happened. He was switched to decadron from prednisone in the hospital.  -dry needling with PT made a big difference with right glute pain -went to urology PT today -doing some heat, ice, methocarbamol and finds helpful - feels motivated for weight loss (has already lost 6 lbs) and core strengthening  Hyperlipidemia, unspecified hyperlipidemia type - Plan: CBC with Differential/Platelet, Comprehensive metabolic panel, Lipid panel -Not currently treated.  We will update lipid panel today and ASCVD risk calculation.  Risk calculation at 2.5% with LDL at 160- we discussed importance of diet and exercise and to consider statin if levels get above 7.5%   Major depressive disorder with single episode, in full remission/Anxiety state-a lot of situational stress given tension with his mother and her prior illness.  Has not tolerated SSRIs due to worsening erectile dysfunction.  Failed buspirone in the past.  Has worked with his EAP.  Uses sparing lorazepam. - luckily in full remission despite recent stressors. Would like to keep lorazepam on hand- but situation is at least improving with his mom  ED- cialis helpful - Samantha prescribed  Migraines- rare recently but has sumatriptan if needed.   Post-surgical hypothyroidism - Plan: TSH.  On Armour Thyroid through [facility]  Lab Results   Component Value Date   TSH 1.01 03/03/2019   Attention deficit hyperactivity disorder (ADHD)-on Adderall XR through psychiatry- [facility] Amber attention specialist Dr. Johnnye Sima. BP ran high on mydayis  Vitamin D deficiency - Plan: Vitamin D  (25 hydroxy )-currently taking 15000 units about 6 days a week- encouraged him to cut to 1000 units per day. .  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 Told him I would be happy to  sign up on forms for phlebotomy as needed.  -apparently they target testosterone in 800 to 900 range Lab Results  Component Value Date   WBC 8.1 03/03/2019   HGB 17.5 (H) 03/03/2019   HCT 53.5 (H) 03/03/2019   MCV 87.5 03/03/2019   PLT 280.0 03/03/2019   [facility]- following with urology and on PT and using oxybutynin (gets some dry mouth)- may try off  Please note visit started off in person but as I was running behind and patient had a conference call- we completed the visit by video  Recommended follow up: Return in about 6 months (around 09/01/2019) for follow up- or sooner if needed. Future Appointments  Date Time Provider [place]  03/08/2019  8:00 AM Lyndee Hensen, PT [facility]  03/13/2019  8:45 AM Lyndee Hensen, PT [facility]  03/15/2019 11:00 AM Lyndee Hensen, PT [facility]  03/20/2019  9:30 AM Lyndee Hensen, PT [facility]  03/22/2019 11:00 AM Lyndee Hensen, PT [facility]  03/27/2019 11:00 AM Lyndee Hensen, PT [facility]  03/29/2019 11:00 AM Lyndee Hensen, PT [facility]  04/03/2019 11:00 AM Lyndee Hensen, PT [facility]  04/05/2019 11:00 AM Lyndee Hensen, PT [facility]   Lab/Order associations: fasting   ICD-10-CM   1. Preventative health care  Z00.00 CBC with Differential/Platelet    Comprehensive metabolic panel    Lipid panel    TSH    Vitamin D  (25 hydroxy )  2. Hyperlipidemia, unspecified hyperlipidemia type  E78.5 CBC with  Differential/Platelet    Comprehensive metabolic panel    Lipid panel  3. Major depressive disorder with single episode, in full remission ([place])  F32.5   4. Anxiety state  F41.1   5. Post-surgical hypothyroidism  E89.0 TSH  6. Attention deficit hyperactivity disorder (ADHD), predominantly inattentive type  F90.0   7. Vitamin D deficiency  E55.9 Vitamin D  (25 hydroxy )  8. Secondary polycythemia  D75.1   9. Skin cancer screening  Z12.83 Dermatology   Return precautions advised.  Garret Reddish, MD

## 2019-03-03 NOTE — Patient Instructions (Signed)
  Please update PHQ-9 and GAD-7  Recommended follow up: Return in about 6 months (around 09/01/2019) for follow up- or sooner if needed.

## 2019-03-04 NOTE — Patient Instructions (Signed)
Piriformis stretch seated and supine 30 sec x3 each bil,  3 x/day SKTC, 30 sec x3 bil, 3x/day  Tennis ball, roll on glute/wall 2x/day  Hip IR stretch 30 sec x3 bil, 3x/day Hip ITB stretch w strap 30 sec x3, 2 x/day

## 2019-03-06 ENCOUNTER — Encounter: Payer: Self-pay | Admitting: Physical Therapy

## 2019-03-06 DIAGNOSIS — M5416 Radiculopathy, lumbar region: Secondary | ICD-10-CM | POA: Diagnosis not present

## 2019-03-06 NOTE — Therapy (Signed)
[facility] [address] [place], [place], [address] Phone: [phone]   Fax:  [phone]  Physical Therapy Evaluation  Patient Details  Name: Robert [name] MRN: [id number] Date of Birth: Aug 20, 1975 Referring Provider (PT): Lynne Leader   Encounter Date: 03/01/2019  PT End of Session - 03/06/19 0847    Number of Visits  12    Date for PT Re-Evaluation  04/12/19    Authorization Type  [employer]    PT Start Time  [id number]    PT Stop Time  1650    PT Time Calculation (min)  45 min    Activity Tolerance  Patient tolerated treatment well;Patient limited by pain    Behavior During Therapy  [employer] for tasks assessed/performed       Past Medical History:  Diagnosis Date  . ADD (attention deficit disorder) without hyperactivity    according to his mother/ work test possible  . Chronic sinusitis   . Headache   . Kidney stones   . Sleep apnea     Past Surgical History:  Procedure Laterality Date  . HERNIA REPAIR     pt. was an infant when this surgery occured  . NASAL SINUS SURGERY    . THYROID SURGERY Right 02/28/2014   DR TEOH    PARTIAL THYROIDECTOMY  . THYROIDECTOMY Right 02/28/2014   Procedure: RIGHT HEMI THYROIDECTOMY;  Surgeon: Ascencion Dike, MD;  Location: [facility];  Service: ENT;  Laterality: Right;    There were no vitals filed for this visit.   Subjective Assessment - 03/06/19 0841    Subjective  Pt states significant tightness in piriformis a couple weeks ago, no back pain to start. Saw Ortho MD, then went to [facility] for a few days. Pt states severe pain while he was away, ended up in hospital there, states "worst pain he has ever felt" pain in back R, and into R glute region. He has CT report (states pars fracture bil), also had MRI, will get results of that. Pt put on pain meds, and now feels pain has calmed down quite a bit. Most pain now in R glute, and in R low back/SI. Does state pain into LE. Pt works from home, usually travels a  lot, now not traveling due to covid.    Limitations  Sitting;Lifting;Standing;Walking;House hold activities    Patient Stated Goals  Decreased pain    Currently in Pain?  Yes    Pain Score  7     Pain Location  Back    Pain Orientation  Right;Left    Pain Descriptors / Indicators  Aching;Burning;Tightness    Pain Type  Acute pain    Pain Radiating Towards  Into R glute/thigh    Pain Onset  1 to 4 weeks ago    Pain Frequency  Intermittent    Aggravating Factors   Sitting, standing, walking, bending.    Pain Relieving Factors  pain meds.         [facility] PT Assessment - 03/06/19 0001      Assessment   Medical Diagnosis  Piriformis, Sciatica R side    Referring Provider (PT)  Lynne Leader    Prior Therapy  no      Balance Screen   Has the patient fallen in the past 6 months  No      Prior Function   Level of Independence  Independent      Cognition   Overall Cognitive Status  Within Functional Limits  for tasks assessed      AROM   Overall AROM Comments  Lumbar Flexion: mild/mod limitation/pain.  Ext: WNL, SB: WFL.  Hips: WFL,       Strength   Overall Strength Comments  R hip flex: 4-/5, abd: 4-/5, R Knee Ext: 4-/5,   L: 5/5       Palpation   Palpation comment  Tenderness in R piriformis/glute region with deep palpation. Pain in R low lumbar and SI region.       Special Tests   Other special tests  + SLR on R, Bil HS tightness, + slump on R                Objective measurements completed on examination: See above findings.      [facility] Adult PT Treatment/Exercise - 03/06/19 0001      Exercises   Exercises  Lumbar      Lumbar Exercises: Stretches   Single Knee to Chest Stretch  3 reps;30 seconds    Single Knee to Chest Stretch Limitations  bil    ITB Stretch  3 reps;30 seconds    ITB Stretch Limitations  Supine with strap    Piriformis Stretch  2 reps;30 seconds    Piriformis Stretch Limitations  seated and supine    Other Lumbar Stretch Exercise  Hip IR  across body 30 sec x3      Manual Therapy   Manual Therapy  Joint mobilization;Soft tissue mobilization;Passive ROM;Manual Traction    Manual therapy comments  skilled palpation and monitoring of soft tissue with dry needling.        Trigger Point Dry Needling - 03/06/19 0001    Consent Given?  Yes    Education Handout Provided  Yes    Muscles Treated Back/Hip  Gluteus maximus;Piriformis    Gluteus Maximus Response  Twitch response elicited;Palpable increased muscle length    Piriformis Response  Palpable increased muscle length           PT Education - 03/06/19 0846    Education Details  PT POC, Exam findings, HEP    Person(s) Educated  Patient    Methods  Explanation;Demonstration;Handout    Comprehension  Verbalized understanding;Returned demonstration;Verbal cues required;Need further instruction       PT Short Term Goals - 03/06/19 0847      PT SHORT TERM GOAL #1   Title  Pt to be independent with initial HEP    Time  2    Period  Weeks    Status  New    Target Date  03/15/19      PT SHORT TERM GOAL #2   Title  Pt to report pain in back/leg to 4/10    Time  2    Period  Weeks    Status  New    Target Date  03/15/19        PT Long Term Goals - 03/06/19 0847      PT LONG TERM GOAL #1   Title  Pt to be independent with final HEP    Time  6    Period  Weeks    Status  New    Target Date  04/11/19      PT LONG TERM GOAL #2   Title  Pt to report decreased pain to 0-2/10 in back and R LE.    Time  6    Period  Weeks    Status  New    Target Date  04/11/19      PT LONG TERM GOAL #3   Title  Pt to demo improved strength of R LE to at least 4+/5 to improve stability and functional activity    Time  6    Period  Weeks    Status  New    Target Date  04/11/19      PT LONG TERM GOAL #4   Title  Pt to demo ability for bend, squat, lift, with proper mechanics, and no pain, to improve ability for IADLs.    Time  6    Period  Weeks    Status  New     Target Date  04/11/19             Plan - 03/06/19 0848    Clinical Impression Statement  Pt presents with primary complaint of increased pain in R low back and into R LE. Pt does have tightness and soreness in piriformis region, but also now has pain in Back and SI. Pt with most limitation with bending Fwd, also has + SLR and weakness in R LE. Piriformis treated today with DN, will assess effects next visit. WIll continue to assess for symptoms coming from piriformis vs back/radiculopathy. Pt with significant decrease in mobility, bending, lifting, sitting, walking, IADLs, due to pain. Pt to benefit from skilled PT to improve.    Personal Factors and Comorbidities  Fitness    Examination-Activity Limitations  Bathing;Locomotion Level;Transfers;Bend;Sit;Sleep;Squat;Stairs;Dressing;Stand;Lift    Examination-Participation Restrictions  Cleaning;Community Activity;Driving;Shop;Yard Work    Merchant [employer] officer  Evolving/Moderate complexity    Clinical Decision Making  Moderate    Rehab Potential  Good    PT Frequency  2x / week    PT Duration  6 weeks    PT Treatment/Interventions  ADLs/Self Care Home Management;Cryotherapy;Electrical Stimulation;Iontophoresis 4mg /ml Dexamethasone;Moist Heat;Traction;Ultrasound;DME Instruction;Neuromuscular re-education;Balance training;Therapeutic exercise;Therapeutic activities;Functional mobility training;Gait training;Stair training;Patient/family education;Orthotic Fit/Training;Manual techniques;Energy conservation;Dry needling;Passive range of motion;Taping;Spinal Manipulations;Joint Manipulations    Consulted and Agree with Plan of Care  Patient       Patient will benefit from skilled therapeutic intervention in order to improve the following deficits and impairments:  Decreased range of motion, Difficulty walking, Increased muscle spasms, Decreased endurance, Decreased activity tolerance, Pain, Decreased balance, Hypomobility, Impaired  flexibility, Improper body mechanics, Decreased strength, Decreased mobility  Visit Diagnosis: Radiculopathy, lumbar region     Problem List Patient Active Problem List   Diagnosis Date Noted  . Cervical radiculopathy at C6 08/01/2018  . Radial nerve entrapment, right 07/04/2018  . Hyperlipidemia, unspecified 10/22/2017  . Secondary polycythemia 09/09/2017  . Lumbar back pain with radiculopathy affecting right lower extremity 08/29/2016  . Wart 02/27/2015  . Volar plate injury of finger 01/09/2015  . Depression, major 09/27/2014  . Rash 09/27/2014  . Erectile dysfunction 09/27/2014  . Vitamin D deficiency 07/23/2014  . 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 S/P partial thyroidectomy 02/28/2014  . Anxiety state 01/11/2014  . Obesity (BMI 35.0-39.9 without comorbidity) 01/11/2014  . Cyst of mandible 12/22/2013  . ADHD (attention deficit hyperactivity disorder) 06/02/2011    Lyndee Hensen, PT, DPT 8:52 AM  03/06/19    [facility] [place], [place], [address] Phone: [phone]   Fax:  [phone]  Name: Robert [name] MRN: [id number] Date of Birth: Jun 08, 1975

## 2019-03-08 ENCOUNTER — Ambulatory Visit (INDEPENDENT_AMBULATORY_CARE_PROVIDER_SITE_OTHER): Payer: Commercial Managed Care - PPO | Admitting: Physical Therapy

## 2019-03-08 ENCOUNTER — Encounter: Payer: Self-pay | Admitting: Physical Therapy

## 2019-03-08 DIAGNOSIS — M5416 Radiculopathy, lumbar region: Secondary | ICD-10-CM | POA: Diagnosis not present

## 2019-03-08 NOTE — Therapy (Signed)
Robert [name] [address] [place], [place], [address] Phone: [phone]   Fax:  [phone]  Physical Therapy Treatment  Patient Details  Name: GLENDEL [name] MRN: [id number] Date of Birth: 12/29/1975 Referring Provider (PT): Lynne Leader   Encounter Date: 03/08/2019  PT End of Session - 03/08/19 0819    Visit Number  2    Number of Visits  12    Date for PT Re-Evaluation  04/12/19    Authorization Type  [employer]    PT Start Time  0810    PT Stop Time  0845    PT Time Calculation (min)  35 min    Activity Tolerance  Patient tolerated treatment well;Patient limited by pain    Behavior During Therapy  Robert [name] for tasks assessed/performed       Past Medical History:  Diagnosis Date  . ADD (attention deficit disorder) without hyperactivity    according to his mother/ work test possible  . Chronic sinusitis   . Headache   . Kidney stones   . Sleep apnea     Past Surgical History:  Procedure Laterality Date  . HERNIA REPAIR     pt. was an infant when this surgery occured  . NASAL SINUS SURGERY    . THYROID SURGERY Right 02/28/2014   DR TEOH    PARTIAL THYROIDECTOMY  . THYROIDECTOMY Right 02/28/2014   Procedure: RIGHT HEMI THYROIDECTOMY;  Surgeon: Ascencion Dike, MD;  Location: [facility];  Service: ENT;  Laterality: Right;    There were no vitals filed for this visit.  Subjective Assessment - 03/08/19 0818    Subjective  Pt states doing significantly better since last visit.    Patient Stated Goals  Decreased pain    Currently in Pain?  Yes    Pain Score  4     Pain Location  Back    Pain Orientation  Right;Left    Pain Descriptors / Indicators  Aching;Burning;Tightness    Pain Type  Acute pain    Pain Onset  1 to 4 weeks ago    Pain Frequency  Intermittent                       [facility] Adult PT Treatment/Exercise - 03/08/19 0001      Lumbar Exercises: Stretches   ITB Stretch  3 reps;30 seconds    ITB Stretch  Limitations  Supine with strap    Piriformis Stretch  2 reps;30 seconds    Piriformis Stretch Limitations  supine      Lumbar Exercises: Aerobic   Stationary Bike  L2 x 6 min      Lumbar Exercises: Seated   Long Arc Quad on Chair  15 reps;Right    Other Seated Lumbar Exercises  Nerve floss/slump position x20 on L       Lumbar Exercises: Prone   Other Prone Lumbar Exercises  Prone on elbows x2 min,  Prone press ups x15;       Manual Therapy   Manual Therapy  Joint mobilization;Soft tissue mobilization;Manual Traction;Passive ROM    Manual therapy comments  skilled palpation and monitoring of soft tissue with dry needling.     Manual Traction  long leg distraction R, for lumbar pump x3 min;        Trigger Point Dry Needling - 03/08/19 0001    Consent Given?  Yes    Education Handout Provided  Previously provided    Piriformis Response  Twitch response elicited;Palpable increased muscle length             PT Short Term Goals - 03/06/19 0847      PT SHORT TERM GOAL #1   Title  Pt to be independent with initial HEP    Time  2    Period  Weeks    Status  New    Target Date  03/15/19      PT SHORT TERM GOAL #2   Title  Pt to report pain in back/leg to 4/10    Time  2    Period  Weeks    Status  New    Target Date  03/15/19        PT Long Term Goals - 03/06/19 0847      PT LONG TERM GOAL #1   Title  Pt to be independent with final HEP    Time  6    Period  Weeks    Status  New    Target Date  04/11/19      PT LONG TERM GOAL #2   Title  Pt to report decreased pain to 0-2/10 in back and R LE.    Time  6    Period  Weeks    Status  New    Target Date  04/11/19      PT LONG TERM GOAL #3   Title  Pt to demo improved strength of R LE to at least 4+/5 to improve stability and functional activity    Time  6    Period  Weeks    Status  New    Target Date  04/11/19      PT LONG TERM GOAL #4   Title  Pt to demo ability for bend, squat, lift, with proper  mechanics, and no pain, to improve ability for IADLs.    Time  6    Period  Weeks    Status  New    Target Date  04/11/19            Plan - 03/08/19 1118    Clinical Impression Statement  Pt with decreased pain overall today. He was educated on prone positioning for LE pain, and prone press up for HEP, with precaution to avoid extreme extension due to possible pars fracture seen on imaging. Pt continues to have nerve tension and weakness in R LE, with symptoms seeming to stem from back. DN repeated for tightness in pririformis due to + response last visit.    Personal Factors and Comorbidities  Fitness    Examination-Activity Limitations  Bathing;Locomotion Level;Transfers;Bend;Sit;Sleep;Squat;Stairs;Dressing;Stand;Lift    Examination-Participation Restrictions  Cleaning;Community Activity;Driving;Shop;Yard Work    [employer] officer  Evolving/Moderate complexity    Rehab Potential  Good    PT Frequency  2x / week    PT Duration  6 weeks    PT Treatment/Interventions  ADLs/Self Care Home Management;Cryotherapy;Electrical Stimulation;Iontophoresis 4mg /ml Dexamethasone;Moist Heat;Traction;Ultrasound;DME Instruction;Neuromuscular re-education;Balance training;Therapeutic exercise;Therapeutic activities;Functional mobility training;Gait training;Stair training;Patient/family education;Orthotic Fit/Training;Manual techniques;Energy conservation;Dry needling;Passive range of motion;Taping;Spinal Manipulations;Joint Manipulations    Consulted and Agree with Plan of Care  Patient       Patient will benefit from skilled therapeutic intervention in order to improve the following deficits and impairments:  Decreased range of motion, Difficulty walking, Increased muscle spasms, Decreased endurance, Decreased activity tolerance, Pain, Decreased balance, Hypomobility, Impaired flexibility, Improper body mechanics, Decreased strength, Decreased mobility  Visit Diagnosis: Radiculopathy,  lumbar region     Problem List Patient  Active Problem List   Diagnosis Date Noted  . Cervical radiculopathy at C6 08/01/2018  . Radial nerve entrapment, right 07/04/2018  . Hyperlipidemia, unspecified 10/22/2017  . Secondary polycythemia 09/09/2017  . Lumbar back pain with radiculopathy affecting right lower extremity 08/29/2016  . Wart 02/27/2015  . Volar plate injury of finger 01/09/2015  . Depression, major 09/27/2014  . Rash 09/27/2014  . Erectile dysfunction 09/27/2014  . Vitamin D deficiency 07/23/2014  . 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 S/P partial thyroidectomy 02/28/2014  . Anxiety state 01/11/2014  . Obesity (BMI 35.0-39.9 without comorbidity) 01/11/2014  . Cyst of mandible 12/22/2013  . ADHD (attention deficit hyperactivity disorder) 06/02/2011    Lyndee Hensen, PT, DPT 11:22 AM  03/08/19    [facility] [place], [place], [address] Phone: [phone]   Fax:  [phone]  Name: CALLISTER KIESCHNICK MRN: [id number] Date of Birth: 1975/07/02

## 2019-03-13 ENCOUNTER — Ambulatory Visit (INDEPENDENT_AMBULATORY_CARE_PROVIDER_SITE_OTHER): Payer: Commercial Managed Care - PPO | Admitting: Physical Therapy

## 2019-03-13 ENCOUNTER — Encounter: Payer: Self-pay | Admitting: Physical Therapy

## 2019-03-13 DIAGNOSIS — M5416 Radiculopathy, lumbar region: Secondary | ICD-10-CM

## 2019-03-13 NOTE — Therapy (Signed)
[facility] [address] [place], [place], [address] Phone: [phone]   Fax:  [phone]  Physical Therapy Treatment  Patient Details  Name: Robert [name] MRN: [id number] Date of Birth: 1975/05/31 Referring Provider (PT): Lynne Leader   Encounter Date: 03/13/2019  PT End of Session - 03/13/19 1012    Visit Number  3    Number of Visits  12    Date for PT Re-Evaluation  04/12/19    Authorization Type  [employer]HC    PT Start Time  0845    PT Stop Time  0928    PT Time Calculation (min)  43 min    Activity Tolerance  Patient tolerated treatment well;Patient limited by pain    Behavior During Therapy  [facility] for tasks assessed/performed       Past Medical History:  Diagnosis Date  . ADD (attention deficit disorder) without hyperactivity    according to his mother/ work test possible  . Chronic sinusitis   . Headache   . Kidney stones   . Sleep apnea     Past Surgical History:  Procedure Laterality Date  . HERNIA REPAIR     pt. was an infant when this surgery occured  . NASAL SINUS SURGERY    . THYROID SURGERY Right 02/28/2014   DR TEOH    PARTIAL THYROIDECTOMY  . THYROIDECTOMY Right 02/28/2014   Procedure: RIGHT HEMI THYROIDECTOMY;  Surgeon: Ascencion Dike, MD;  Location: [facility];  Service: ENT;  Laterality: Right;    There were no vitals filed for this visit.  Subjective Assessment - 03/13/19 1010    Subjective  Pt with continued pain relief, still sore in R low back, with pain into R LE. Has been doing HEP. Seeing spine MD today    Currently in Pain?  Yes    Pain Score  4     Pain Location  Back    Pain Orientation  Right;Left    Pain Descriptors / Indicators  Aching;Tightness    Pain Type  Acute pain    Pain Radiating Towards  into R glute, posterrior and lateral thigh    Pain Onset  1 to 4 weeks ago    Pain Frequency  Intermittent                       [facility] Adult PT Treatment/Exercise - 03/13/19 0846       Ambulation/Gait   Gait Comments  35 ft x10      Lumbar Exercises: Stretches   ITB Stretch  --    ITB Stretch Limitations  --    Piriformis Stretch  --    Piriformis Stretch Limitations  --    Gastroc Stretch  3 reps;30 seconds;Right      Lumbar Exercises: Aerobic   Stationary Bike  L2 x 6 min      Lumbar Exercises: Standing   Row  20 reps    Theraband Level (Row)  Level 3 (Green)    Other Standing Lumbar Exercises  EIS x20;  Side glides to L at wall x20;       Lumbar Exercises: Seated   Long Arc Quad on Chair  15 reps;Right    Sit to Stand  10 reps    Sit to Stand Limitations  with education on form for hip hinge motion     Other Seated Lumbar Exercises  Nerve floss/slump position x20 on L  Lumbar Exercises: Supine   Ab Set  20 reps    Bent Knee Raise  10 reps      Lumbar Exercises: Prone   Other Prone Lumbar Exercises  Prone on elbows x2 min,  Prone press ups x20 , to hands;       Manual Therapy   Manual Therapy  Joint mobilization;Soft tissue mobilization;Manual Traction;Passive ROM    Manual therapy comments  --    Joint Mobilization  PA mobs lumbar spine gr 3 ;     Manual Traction  long leg distraction R, for lumbar pump x3 min;                PT Short Term Goals - 03/06/19 0847      PT SHORT TERM GOAL #1   Title  Pt to be independent with initial HEP    Time  2    Period  Weeks    Status  New    Target Date  03/15/19      PT SHORT TERM GOAL #2   Title  Pt to report pain in back/leg to 4/10    Time  2    Period  Weeks    Status  New    Target Date  03/15/19        PT Long Term Goals - 03/06/19 0847      PT LONG TERM GOAL #1   Title  Pt to be independent with final HEP    Time  6    Period  Weeks    Status  New    Target Date  04/11/19      PT LONG TERM GOAL #2   Title  Pt to report decreased pain to 0-2/10 in back and R LE.    Time  6    Period  Weeks    Status  New    Target Date  04/11/19      PT LONG TERM GOAL #3    Title  Pt to demo improved strength of R LE to at least 4+/5 to improve stability and functional activity    Time  6    Period  Weeks    Status  New    Target Date  04/11/19      PT LONG TERM GOAL #4   Title  Pt to demo ability for bend, squat, lift, with proper mechanics, and no pain, to improve ability for IADLs.    Time  6    Period  Weeks    Status  New    Target Date  04/11/19            Plan - 03/13/19 1012    Clinical Impression Statement  Pt with decreased pain overall, and improved ability for transfers, standing, and walking mechanics. He does have most limitations with nerve tension in R LE, with decreased ability for fwd flexion. Reviewed press ups today, added side glides. Plan to progress as tolerated.    Personal Factors and Comorbidities  Fitness    Examination-Activity Limitations  Bathing;Locomotion Level;Transfers;Bend;Sit;Sleep;Squat;Stairs;Dressing;Stand;Lift    Examination-Participation Restrictions  Cleaning;Community Activity;Driving;Shop;Yard Work    [employer] officer  Evolving/Moderate complexity    Rehab Potential  Good    PT Frequency  2x / week    PT Duration  6 weeks    PT Treatment/Interventions  ADLs/Self Care Home Management;Cryotherapy;Electrical Stimulation;Iontophoresis 4mg /ml Dexamethasone;Moist Heat;Traction;Ultrasound;DME Instruction;Neuromuscular re-education;Balance training;Therapeutic exercise;Therapeutic activities;Functional mobility training;Gait training;Stair training;Patient/family education;Orthotic Fit/Training;Manual techniques;Energy conservation;Dry needling;Passive range of motion;Taping;Spinal  Manipulations;Joint Manipulations    Consulted and Agree with Plan of Care  Patient       Patient will benefit from skilled therapeutic intervention in order to improve the following deficits and impairments:  Decreased range of motion, Difficulty walking, Increased muscle spasms, Decreased endurance, Decreased activity  tolerance, Pain, Decreased balance, Hypomobility, Impaired flexibility, Improper body mechanics, Decreased strength, Decreased mobility  Visit Diagnosis: Radiculopathy, lumbar region     Problem List Patient Active Problem List   Diagnosis Date Noted  . Cervical radiculopathy at C6 08/01/2018  . Radial nerve entrapment, right 07/04/2018  . Hyperlipidemia, unspecified 10/22/2017  . Secondary polycythemia 09/09/2017  . Lumbar back pain with radiculopathy affecting right lower extremity 08/29/2016  . Wart 02/27/2015  . Volar plate injury of finger 01/09/2015  . Depression, major 09/27/2014  . Rash 09/27/2014  . Erectile dysfunction 09/27/2014  . Vitamin D deficiency 07/23/2014  . 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 S/P partial thyroidectomy 02/28/2014  . Anxiety state 01/11/2014  . Obesity (BMI 35.0-39.9 without comorbidity) 01/11/2014  . Cyst of mandible 12/22/2013  . ADHD (attention deficit hyperactivity disorder) 06/02/2011   Lyndee Hensen, PT, DPT 10:21 AM  03/13/19    [facility] [place], [place], [address] Phone: [phone]   Fax:  [phone]  Name: Robert [name] MRN: [id number] Date of Birth: 11-Sep-1975

## 2019-03-15 ENCOUNTER — Encounter: Payer: Commercial Managed Care - PPO | Admitting: Physical Therapy

## 2019-03-20 ENCOUNTER — Encounter: Payer: Commercial Managed Care - PPO | Admitting: Physical Therapy

## 2019-03-21 ENCOUNTER — Encounter: Payer: Self-pay | Admitting: Family Medicine

## 2019-03-21 ENCOUNTER — Encounter: Payer: Commercial Managed Care - PPO | Admitting: Physical Therapy

## 2019-03-21 ENCOUNTER — Telehealth: Payer: Self-pay | Admitting: Family Medicine

## 2019-03-21 NOTE — Telephone Encounter (Signed)
Left voicemail for the patient to contact the office to set up an in office or virtual appointment with Dr. Yong Channel to discuss concerns.

## 2019-03-21 NOTE — Telephone Encounter (Signed)
pls fu with pt. He has called wanted to set up an appt with a therapist to talk thru some issues. Told pt that I would have DR H or nurse reach out with a contact for him. FU at [phone]

## 2019-03-22 ENCOUNTER — Encounter: Payer: Commercial Managed Care - PPO | Admitting: Physical Therapy

## 2019-03-22 NOTE — Telephone Encounter (Signed)
Pt has appointment tomorrow.

## 2019-03-22 NOTE — Telephone Encounter (Signed)
Please call patient to make app

## 2019-03-22 NOTE — Telephone Encounter (Signed)
Left a voicemail for the patient to give [place] a call back to schedule an appointment.

## 2019-03-23 ENCOUNTER — Encounter: Payer: Self-pay | Admitting: Family Medicine

## 2019-03-23 ENCOUNTER — Ambulatory Visit (INDEPENDENT_AMBULATORY_CARE_PROVIDER_SITE_OTHER): Payer: Commercial Managed Care - PPO | Admitting: Family Medicine

## 2019-03-23 VITALS — Ht 75.0 in | Wt 300.0 lb

## 2019-03-23 DIAGNOSIS — F439 Reaction to severe stress, unspecified: Secondary | ICD-10-CM

## 2019-03-23 NOTE — Patient Instructions (Signed)
There are no preventive care reminders to display for this patient. Depression screen [facility] 2/9 03/03/2019 10/28/2018 09/09/2017  Decreased Interest 0 1 0  Down, Depressed, Hopeless 0 1 0  PHQ - 2 Score 0 2 0  Altered sleeping 0 0 1  Tired, decreased energy 0 1 0  Change in appetite 0 3 1  Feeling bad or failure about yourself  0 0 0  Trouble concentrating 1 3 2   Moving slowly or fidgety/restless 0 0 0  Suicidal thoughts 0 0 0  PHQ-9 Score 1 9 4   Difficult doing work/chores Not difficult at all Somewhat difficult Not difficult at all

## 2019-03-23 NOTE — Progress Notes (Signed)
Phone [phone] Virtual visit via Video note   Subjective:  Chief complaint: Chief Complaint  Patient presents with  . situational stress   This visit type was conducted due to national recommendations for restrictions regarding the COVID-19 Pandemic (e.g. social distancing).  This format is felt to be most appropriate for this patient at this time balancing risks to patient and risks to population by having him in for in person visit.  No physical exam was performed (except for noted visual exam or audio findings with Telehealth visits).    Our team/I connected with Robert [name] at  9:40 AM EST by a video enabled telemedicine application ([email or web address] or caregility through epic) and verified that I am speaking with the correct person using two identifiers.  Location patient: [place] Location provider: [facility], office Persons participating in the virtual visit:  patient  Our team/I discussed the limitations of evaluation and management by telemedicine and the availability of in person appointments. In light of current covid-19 pandemic, patient also understands that we are trying to protect them by minimizing in office contact if at all possible.  The patient expressed consent for telemedicine visit and agreed to proceed. Patient understands insurance will be billed.   ROS- had some mild cough and congestion earlier int the week- had negative covid test. No fever/chills.   Past Medical History-  Patient Active Problem List   Diagnosis Date Noted  . 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 Depression, major 09/27/2014    Priority: Medium  . 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 Anxiety state 01/11/2014    Priority: Medium  . Obesity (BMI 35.0-39.9 without comorbidity) 01/11/2014    Priority: Medium  . ADHD (attention deficit hyperactivity disorder) 06/02/2011    Priority: Medium  . Wart 02/27/2015    Priority: Low  . Volar plate  injury of finger 01/09/2015    Priority: Low  . Rash 09/27/2014    Priority: Low  . Erectile dysfunction 09/27/2014    Priority: Low  . Vitamin D deficiency 07/23/2014    Priority: Low  . S/P partial thyroidectomy 02/28/2014    Priority: Low  . Cyst of mandible 12/22/2013    Priority: Low  . Cervical radiculopathy at C6 08/01/2018  . Radial nerve entrapment, right 07/04/2018  . Hyperlipidemia, unspecified 10/22/2017  . Lumbar back pain with radiculopathy affecting right lower extremity 08/29/2016    Medications- reviewed and updated Current Outpatient Medications  Medication Sig Dispense Refill  . amphetamine-dextroamphetamine (ADDERALL XR) 30 MG 24 hr capsule     . ARMOUR THYROID 120 MG tablet Take 1 tablet by mouth daily.    . cyclobenzaprine (FLEXERIL) 10 MG tablet Take 1 tablet (10 mg total) by mouth 3 (three) times daily as needed for muscle spasms. 30 tablet 1  . gabapentin (NEURONTIN) 300 MG capsule One tab PO qHS for a week, then BID for a week, then TID. May double weekly to a max of 3,600mg /day 180 capsule 3  . HYDROcodone-acetaminophen (NORCO/VICODIN) 5-325 MG tablet Take 1 tablet by mouth every 6 (six) hours as needed. 15 tablet 0  . LORazepam (ATIVAN) 0.5 MG tablet Take 1 tablet (0.5 mg total) by mouth 2 (two) times daily as needed for anxiety. 60 tablet 0  . meloxicam (MOBIC) 15 MG tablet Take 1 tablet (15 mg total) by mouth daily. 30 tablet 3  . oxybutynin (DITROPAN) 5 MG tablet TAKE 1 TABLET BY MOUTH EVERYDAY AT BEDTIME    .  SUMAtriptan (IMITREX) 50 MG tablet Take 1 tablet (50 mg total) by mouth every 2 (two) hours as needed for migraine. May repeat in 2 hours if headache persists or recurs. 90 tablet 3  . tadalafil (CIALIS) 20 MG tablet Take 0.5-1 tablets (10-20 mg total) by mouth every other day as needed for erectile dysfunction. 5 tablet 2  . Diclofenac Sodium (PENNSAID) 2 % SOLN Place 2 g onto the skin 2 (two) times daily. (Patient not taking: Reported on  02/16/2019) 112 g 3   No current facility-administered medications for this visit.     Objective:  Ht 6\' 3"  (1.905 m)   Wt 300 lb (136.1 kg)   BMI 37.50 kg/m  self reported vitals Gen: NAD, resting comfortably Lungs: nonlabored, normal respiratory rate  Skin: appears dry, no obvious rash    Assessment and Plan   # Situational Stress S: Patient with continued irritation/agitation in working with her mother. He and sister had removed alcohol from mother's home to try to reduce her risk- history of alcohol use. Mom is asking for alcohol back in "case she has friends over" which is less than ideal with covid 63. She is using language as if patient stole from his mother.   PHQ9 of 1 today. GAD7 of 1. Feels like irritability with mom and frustration with her situation is the main concern.  A/P: Situational stress-can continue Ativan for peak anxiety.  He does have a history of depression but no evidence that depression is active at present-I do not think a medication such as an SSRI will help him navigate this difficult situation and we discussed this but he can continue sparing Ativan and I would be willing to refill this for up to 6 months from today -We discussed adding counseling in with Baxter Kail will be more helpful than SSRI or other daily therapy  # still following with Janeann Forehand with PT- dry needling most helpful  Recommended follow up: We did not schedule follow-up but next visit should likely be with behavioral health to help with stress management/situational stress Future Appointments  Date Time Provider [place]  03/27/2019 11:00 AM Lyndee Hensen, PT [facility]  03/29/2019 11:00 AM Lyndee Hensen, PT [facility]  04/03/2019 11:00 AM Lyndee Hensen, PT [facility]  04/05/2019 11:00 AM Lyndee Hensen, PT [facility]    Lab/Order associations:   ICD-10-CM   1. Situational stress  F43.9     Time Stamp The duration of face-to-face time during  this visit was greater than 15 minutes. Greater than 50% of this time was spent in counseling, explanation of diagnosis, planning of further management, and/or coordination of care including navigating difficult home situation-patient clearly loves his mother and wants a good relationship with her but there is a lot of strain there-we discussed counseling may be the best bet and I also provided some direct counseling today.     Return precautions advised.  Garret Reddish, MD

## 2019-03-27 ENCOUNTER — Encounter: Payer: Commercial Managed Care - PPO | Admitting: Physical Therapy

## 2019-03-29 ENCOUNTER — Encounter: Payer: Commercial Managed Care - PPO | Admitting: Physical Therapy

## 2019-04-03 ENCOUNTER — Other Ambulatory Visit: Payer: Self-pay

## 2019-04-03 ENCOUNTER — Encounter: Payer: Self-pay | Admitting: Physical Therapy

## 2019-04-03 ENCOUNTER — Ambulatory Visit (INDEPENDENT_AMBULATORY_CARE_PROVIDER_SITE_OTHER): Payer: Commercial Managed Care - PPO | Admitting: Physical Therapy

## 2019-04-03 DIAGNOSIS — M5416 Radiculopathy, lumbar region: Secondary | ICD-10-CM | POA: Diagnosis not present

## 2019-04-03 NOTE — Patient Instructions (Signed)
Access Code: [email or web address]  URL: [email or web address]  Date: 04/03/2019  Prepared by: Lyndee Hensen   Exercises Supine March - 10 reps - 2 sets - 2x daily Supine Bridge - 10 reps - 2 sets - 1x daily Straight Leg Raise - 10 reps - 2 sets - 1x daily Sidelying Hip Abduction - 10 reps - 2 sets - 1x daily

## 2019-04-03 NOTE — Therapy (Signed)
[facility] [address] [place], [place], [address] Phone: [phone]   Fax:  [phone]  Physical Therapy Treatment  Patient Details  Name: Robert [name] MRN: [id number] Date of Birth: [birth year] Referring Provider (PT): Lynne Leader   Encounter Date: 04/03/2019  PT End of Session - 04/03/19 1109    Visit Number  4    Number of Visits  12    Date for PT Re-Evaluation  04/12/19    Authorization Type  [employer]    PT Start Time  1100    PT Stop Time  1154    PT Time Calculation (min)  54 min    Activity Tolerance  Patient tolerated treatment well;Patient limited by pain    Behavior During Therapy  [facility] for tasks assessed/performed       Past Medical History:  Diagnosis Date  . ADD (attention deficit disorder) without hyperactivity    according to his mother/ work test possible  . Chronic sinusitis   . Headache   . Kidney stones   . Sleep apnea     Past Surgical History:  Procedure Laterality Date  . HERNIA REPAIR     pt. was an infant when this surgery occured  . NASAL SINUS SURGERY    . THYROID SURGERY Right 02/28/2014   DR TEOH    PARTIAL THYROIDECTOMY  . THYROIDECTOMY Right 02/28/2014   Procedure: RIGHT HEMI THYROIDECTOMY;  Surgeon: Ascencion Dike, MD;  Location: [facility];  Service: ENT;  Laterality: Right;    There were no vitals filed for this visit.  Subjective Assessment - 04/03/19 1107    Subjective  Pt states decreased pain and tingling in back and LE in last couple weeks. He has increased pain in R glute for a few days. Has been doing HEP.    Currently in Pain?  Yes    Pain Score  3     Pain Location  Hip    Pain Orientation  Right    Pain Descriptors / Indicators  Aching;Tightness    Pain Type  Acute pain    Pain Onset  1 to 4 weeks ago    Pain Frequency  Intermittent                       [facility] Adult PT Treatment/Exercise - 04/03/19 1100      Ambulation/Gait   Gait Comments  --      Lumbar  Exercises: Stretches   Piriformis Stretch  2 reps;30 seconds    Piriformis Stretch Limitations  seated    Gastroc Stretch  --      Lumbar Exercises: Aerobic   Stationary Bike  L3 x 8 min      Lumbar Exercises: Standing   Row  --    Theraband Level (Row)  --    Other Standing Lumbar Exercises  EIS x20;  Side glides to L at wall x20;       Lumbar Exercises: Seated   Long Arc Quad on Chair  --    Sit to Stand  --    Sit to Stand Limitations  --    Other Seated Lumbar Exercises  --      Lumbar Exercises: Supine   Ab Set  10 reps    Bent Knee Raise  20 reps    Bridge  15 reps    Straight Leg Raise  10 reps      Lumbar Exercises: Sidelying  Hip Abduction  Both;15 reps      Lumbar Exercises: Prone   Other Prone Lumbar Exercises  --      Manual Therapy   Manual Therapy  Joint mobilization;Soft tissue mobilization;Manual Traction;Passive ROM    Manual therapy comments  skilled palpation and monitoring of soft tissue with dry needling.     Joint Mobilization  --    Manual Traction  --       Trigger Point Dry Needling - 04/03/19 0001    Consent Given?  Yes    Education Handout Provided  Previously provided    Muscles Treated Back/Hip  Gluteus medius;Gluteus maximus;Piriformis    Gluteus Medius Response  Twitch response elicited;Palpable increased muscle length    Gluteus Maximus Response  Twitch response elicited;Palpable increased muscle length    Piriformis Response  Palpable increased muscle length           PT Education - 04/03/19 1209    Education Details  updated HEP    Person(s) Educated  Patient    Methods  Explanation;Demonstration;Tactile cues;Verbal cues;Handout    Comprehension  Verbalized understanding;Returned demonstration;Verbal cues required;Need further instruction       PT Short Term Goals - 04/03/19 1210      PT SHORT TERM GOAL #1   Title  Pt to be independent with initial HEP    Time  2    Period  Weeks    Status  Achieved    Target Date   03/15/19      PT SHORT TERM GOAL #2   Title  Pt to report pain in back/leg to 4/10    Time  2    Period  Weeks    Status  Achieved    Target Date  03/15/19        PT Long Term Goals - 03/06/19 0847      PT LONG TERM GOAL #1   Title  Pt to be independent with final HEP    Time  6    Period  Weeks    Status  New    Target Date  04/11/19      PT LONG TERM GOAL #2   Title  Pt to report decreased pain to 0-2/10 in back and R LE.    Time  6    Period  Weeks    Status  New    Target Date  04/11/19      PT LONG TERM GOAL #3   Title  Pt to demo improved strength of R LE to at least 4+/5 to improve stability and functional activity    Time  6    Period  Weeks    Status  New    Target Date  04/11/19      PT LONG TERM GOAL #4   Title  Pt to demo ability for bend, squat, lift, with proper mechanics, and no pain, to improve ability for IADLs.    Time  6    Period  Weeks    Status  New    Target Date  04/11/19            Plan - 04/03/19 1211    Clinical Impression Statement  Pt with improving pain in back from previous session. He has improving lumbar flexion ability, still has mild nerve tension with flexion. Pt progressed with core strength today, challenged wtih this with much instabiliy noted, HEP updated. He has signficant tightness in R glute today, addressed with manual and  DN, may require additional DN for this. Plan to progress strength as tolerated, as pain improves.    Personal Factors and Comorbidities  Fitness    Examination-Activity Limitations  Bathing;Locomotion Level;Transfers;Bend;Sit;Sleep;Squat;Stairs;Dressing;Stand;Lift    Examination-Participation Restrictions  Cleaning;Community Activity;Driving;Shop;Yard Work    [employer] officer  Evolving/Moderate complexity    Rehab Potential  Good    PT Frequency  2x / week    PT Duration  6 weeks    PT Treatment/Interventions  ADLs/Self Care Home Management;Cryotherapy;Electrical  Stimulation;Iontophoresis 4mg /ml Dexamethasone;Moist Heat;Traction;Ultrasound;DME Instruction;Neuromuscular re-education;Balance training;Therapeutic exercise;Therapeutic activities;Functional mobility training;Gait training;Stair training;Patient/family education;Orthotic Fit/Training;Manual techniques;Energy conservation;Dry needling;Passive range of motion;Taping;Spinal Manipulations;Joint Manipulations    Consulted and Agree with Plan of Care  Patient       Patient will benefit from skilled therapeutic intervention in order to improve the following deficits and impairments:  Decreased range of motion, Difficulty walking, Increased muscle spasms, Decreased endurance, Decreased activity tolerance, Pain, Decreased balance, Hypomobility, Impaired flexibility, Improper body mechanics, Decreased strength, Decreased mobility  Visit Diagnosis: Radiculopathy, lumbar region     Problem List Patient Active Problem List   Diagnosis Date Noted  . Cervical radiculopathy at C6 08/01/2018  . Radial nerve entrapment, right 07/04/2018  . Hyperlipidemia, unspecified 10/22/2017  . Secondary polycythemia 09/09/2017  . Lumbar back pain with radiculopathy affecting right lower extremity 08/29/2016  . Wart 02/27/2015  . Volar plate injury of finger 01/09/2015  . Depression, major 09/27/2014  . Rash 09/27/2014  . Erectile dysfunction 09/27/2014  . Vitamin D deficiency 07/23/2014  . 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 S/P partial thyroidectomy 02/28/2014  . Anxiety state 01/11/2014  . Obesity (BMI 35.0-39.9 without comorbidity) 01/11/2014  . Cyst of mandible 12/22/2013  . ADHD (attention deficit hyperactivity disorder) 06/02/2011   Lyndee Hensen, PT, DPT 12:13 PM  04/03/19    [facility] [place], [place], [address] Phone: [phone]   Fax:  [phone]  Name: KYRION MORLES MRN: [id number] Date of Birth: May 22, 1975

## 2019-04-05 ENCOUNTER — Other Ambulatory Visit: Payer: Self-pay

## 2019-04-05 ENCOUNTER — Ambulatory Visit (INDEPENDENT_AMBULATORY_CARE_PROVIDER_SITE_OTHER): Payer: Commercial Managed Care - PPO | Admitting: Physical Therapy

## 2019-04-05 ENCOUNTER — Encounter: Payer: Self-pay | Admitting: Physical Therapy

## 2019-04-05 ENCOUNTER — Encounter: Payer: Commercial Managed Care - PPO | Admitting: Physical Therapy

## 2019-04-05 DIAGNOSIS — M5416 Radiculopathy, lumbar region: Secondary | ICD-10-CM | POA: Diagnosis not present

## 2019-04-07 ENCOUNTER — Encounter: Payer: Self-pay | Admitting: Physical Therapy

## 2019-04-07 NOTE — Therapy (Signed)
[facility] [address] [place], [place], [address] Phone: [phone]   Fax:  [phone]  Physical Therapy Treatment  Patient Details  Name: Robert [name] MRN: [id number] Date of Birth: 1975/09/10 Referring Provider (PT): Lynne Leader   Encounter Date: 04/05/2019  PT End of Session - 04/07/19 1330    Visit Number  5    Number of Visits  12    Date for PT Re-Evaluation  04/12/19    Authorization Type  [employer]HC    PT Start Time  1302    PT Stop Time  1345    PT Time Calculation (min)  43 min    Activity Tolerance  Patient tolerated treatment well;Patient limited by pain    Behavior During Therapy  [facility] for tasks assessed/performed       Past Medical History:  Diagnosis Date  . ADD (attention deficit disorder) without hyperactivity    according to his mother/ work test possible  . Chronic sinusitis   . Headache   . Kidney stones   . Sleep apnea     Past Surgical History:  Procedure Laterality Date  . HERNIA REPAIR     pt. was an infant when this surgery occured  . NASAL SINUS SURGERY    . THYROID SURGERY Right 02/28/2014   DR TEOH    PARTIAL THYROIDECTOMY  . THYROIDECTOMY Right 02/28/2014   Procedure: RIGHT HEMI THYROIDECTOMY;  Surgeon: Ascencion Dike, MD;  Location: [facility];  Service: ENT;  Laterality: Right;    There were no vitals filed for this visit.  Subjective Assessment - 04/07/19 1329    Subjective  Pt states less pain in glute since last visit. Overall, minimal pain in back. Calf feels very tight.    Patient Stated Goals  Decreased pain    Currently in Pain?  Yes    Pain Score  2     Pain Location  Hip    Pain Orientation  Right    Pain Descriptors / Indicators  Aching    Pain Type  Acute pain    Pain Onset  More than a month ago    Pain Frequency  Intermittent                       [facility] Adult PT Treatment/Exercise - 04/07/19 0001      Lumbar Exercises: Stretches   Piriformis Stretch  2  reps;30 seconds    Piriformis Stretch Limitations  seated      Lumbar Exercises: Aerobic   Stationary Bike  L3 x 8 min      Lumbar Exercises: Seated   Long Arc Quad on Chair  20 reps;Right    Other Seated Lumbar Exercises  Nerve floss/slump position x20 on L       Lumbar Exercises: Supine   Ab Set  10 reps    Clam  20 reps    Clam Limitations  GTB     Bent Knee Raise  20 reps    Bridge  20 reps      Lumbar Exercises: Sidelying   Hip Abduction  Both;20 reps      Manual Therapy   Manual Therapy  Joint mobilization;Soft tissue mobilization;Manual Traction;Passive ROM    Manual therapy comments  skilled palpation and monitoring of soft tissue with dry needling.        Trigger Point Dry Needling - 04/07/19 0001    Consent Given?  Yes    Education  Handout Provided  Previously provided    Muscles Treated Lower Quadrant  Gastrocnemius;Soleus    Muscles Treated Back/Hip  Gluteus medius;Piriformis    Gastrocnemius Response  Twitch response elicited;Palpable increased muscle length    Soleus Response  Twitch response elicited;Palpable increased muscle length    Gluteus Medius Response  Twitch response elicited    Piriformis Response  Twitch response elicited;Palpable increased muscle length             PT Short Term Goals - 04/03/19 1210      PT SHORT TERM GOAL #1   Title  Pt to be independent with initial HEP    Time  2    Period  Weeks    Status  Achieved    Target Date  03/15/19      PT SHORT TERM GOAL #2   Title  Pt to report pain in back/leg to 4/10    Time  2    Period  Weeks    Status  Achieved    Target Date  03/15/19        PT Long Term Goals - 03/06/19 0847      PT LONG TERM GOAL #1   Title  Pt to be independent with final HEP    Time  6    Period  Weeks    Status  New    Target Date  04/11/19      PT LONG TERM GOAL #2   Title  Pt to report decreased pain to 0-2/10 in back and R LE.    Time  6    Period  Weeks    Status  New    Target Date   04/11/19      PT LONG TERM GOAL #3   Title  Pt to demo improved strength of R LE to at least 4+/5 to improve stability and functional activity    Time  6    Period  Weeks    Status  New    Target Date  04/11/19      PT LONG TERM GOAL #4   Title  Pt to demo ability for bend, squat, lift, with proper mechanics, and no pain, to improve ability for IADLs.    Time  6    Period  Weeks    Status  New    Target Date  04/11/19            Plan - 04/07/19 1332    Clinical Impression Statement  Pt with minimal pain in back with ther ex today. He has tightness and discomfort in R glute and in R calf. DN and IASTM done to address today. Will continue to progress core strength and stabilization. Pt with decreasing nerve tension in R LE, and improving lumbar flexion ability, but still not back to WNL.    Personal Factors and Comorbidities  Fitness    Examination-Activity Limitations  Bathing;Locomotion Level;Transfers;Bend;Sit;Sleep;Squat;Stairs;Dressing;Stand;Lift    Examination-Participation Restrictions  Cleaning;Community Activity;Driving;Shop;Yard Work    [employer] officer  Evolving/Moderate complexity    Rehab Potential  Good    PT Frequency  2x / week    PT Duration  6 weeks    PT Treatment/Interventions  ADLs/Self Care Home Management;Cryotherapy;Electrical Stimulation;Iontophoresis 4mg /ml Dexamethasone;Moist Heat;Traction;Ultrasound;DME Instruction;Neuromuscular re-education;Balance training;Therapeutic exercise;Therapeutic activities;Functional mobility training;Gait training;Stair training;Patient/family education;Orthotic Fit/Training;Manual techniques;Energy conservation;Dry needling;Passive range of motion;Taping;Spinal Manipulations;Joint Manipulations    Consulted and Agree with Plan of Care  Patient       Patient will benefit from  skilled therapeutic intervention in order to improve the following deficits and impairments:  Decreased range of motion, Difficulty  walking, Increased muscle spasms, Decreased endurance, Decreased activity tolerance, Pain, Decreased balance, Hypomobility, Impaired flexibility, Improper body mechanics, Decreased strength, Decreased mobility  Visit Diagnosis: Radiculopathy, lumbar region     Problem List Patient Active Problem List   Diagnosis Date Noted  . Cervical radiculopathy at C6 08/01/2018  . Radial nerve entrapment, right 07/04/2018  . Hyperlipidemia, unspecified 10/22/2017  . Secondary polycythemia 09/09/2017  . Lumbar back pain with radiculopathy affecting right lower extremity 08/29/2016  . Wart 02/27/2015  . Volar plate injury of finger 01/09/2015  . Depression, major 09/27/2014  . Rash 09/27/2014  . Erectile dysfunction 09/27/2014  . Vitamin D deficiency 07/23/2014  . 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 S/P partial thyroidectomy 02/28/2014  . Anxiety state 01/11/2014  . Obesity (BMI 35.0-39.9 without comorbidity) 01/11/2014  . Cyst of mandible 12/22/2013  . ADHD (attention deficit hyperactivity disorder) 06/02/2011    Lyndee Hensen, PT, DPT 1:35 PM  04/07/19    [facility] [place], [place], [address] Phone: [phone]   Fax:  [phone]  Name: Robert [name] MRN: [id number] Date of Birth: July 02, 1975

## 2019-04-10 ENCOUNTER — Encounter: Payer: Commercial Managed Care - PPO | Admitting: Physical Therapy

## 2019-04-10 ENCOUNTER — Other Ambulatory Visit: Payer: Self-pay | Admitting: Neurosurgery

## 2019-04-11 ENCOUNTER — Ambulatory Visit (INDEPENDENT_AMBULATORY_CARE_PROVIDER_SITE_OTHER): Payer: Commercial Managed Care - PPO | Admitting: Physical Therapy

## 2019-04-11 ENCOUNTER — Other Ambulatory Visit: Payer: Self-pay

## 2019-04-11 ENCOUNTER — Encounter: Payer: Self-pay | Admitting: Physical Therapy

## 2019-04-11 DIAGNOSIS — M5416 Radiculopathy, lumbar region: Secondary | ICD-10-CM

## 2019-04-11 NOTE — Therapy (Signed)
[facility] [address] [place], [place], [address] Phone: [phone]   Fax:  [phone]  Physical Therapy Treatment/Discharge   Patient Details  Name: Robert [name] MRN: [id number] Date of Birth: 11-20-1975 Referring Provider (PT): Lynne Leader   Encounter Date: 04/11/2019  PT End of Session - 04/11/19 1204    Visit Number  6    Number of Visits  12    Date for PT Re-Evaluation  04/12/19    Authorization Type  UHC    PT Start Time  1102    PT Stop Time  1136    PT Time Calculation (min)  34 min    Activity Tolerance  Patient tolerated treatment well;Patient limited by pain    Behavior During Therapy  [facility] for tasks assessed/performed       Past Medical History:  Diagnosis Date  . ADD (attention deficit disorder) without hyperactivity    according to his mother/ work test possible  . Chronic sinusitis   . Headache   . Kidney stones   . Sleep apnea     Past Surgical History:  Procedure Laterality Date  . HERNIA REPAIR     pt. was an infant when this surgery occured  . NASAL SINUS SURGERY    . THYROID SURGERY Right 02/28/2014   DR TEOH    PARTIAL THYROIDECTOMY  . THYROIDECTOMY Right 02/28/2014   Procedure: RIGHT HEMI THYROIDECTOMY;  Surgeon: Ascencion Dike, MD;  Location: [facility];  Service: ENT;  Laterality: Right;    There were no vitals filed for this visit.  Subjective Assessment - 04/11/19 1202    Subjective  Pt had MD visit yesterday, states he will be having surgery on 1/22 for back. Pt states pain at night and in evening has been worse this week.    Currently in Pain?  Yes    Pain Score  3     Pain Location  Back    Pain Orientation  Right    Pain Descriptors / Indicators  Aching;Burning    Pain Type  Acute pain    Pain Onset  More than a month ago    Pain Frequency  Intermittent                       [facility] Adult PT Treatment/Exercise - 04/11/19 1116      Self-Care   Self-Care  ADL's;Other  Self-Care Comments    Other Self-Care Comments   Education on surgical expectations, HEP leading up to surgery, and implications for post surgery, Discussed home work set up and suggestions      Lumbar Exercises: Stretches   Press Ups  20 reps    Piriformis Stretch  --    Piriformis Stretch Limitations  --      Lumbar Exercises: Aerobic   Stationary Bike  L3 x 8 min      Lumbar Exercises: Standing   Other Standing Lumbar Exercises  EIS x20       Lumbar Exercises: Seated   Long Arc Quad on Chair  --    Other Seated Lumbar Exercises  --      Lumbar Exercises: Supine   Ab Set  10 reps    Clam  --    Clam Limitations  --    Bent Knee Raise  --    Bridge  --    Other Supine Lumbar Exercises  Modified crunch      Lumbar Exercises: Sidelying  Hip Abduction  --      Manual Therapy   Manual Therapy  Joint mobilization;Soft tissue mobilization;Manual Traction;Passive ROM    Manual therapy comments  --             PT Education - 04/11/19 1203    Education Details  HEP: Pt advised to continue prone press ups, standing extensin, and abdominal bracing until surgery. Discussed home work Personal assistant as well.    Person(s) Educated  Patient    Methods  Explanation;Demonstration;Handout    Comprehension  Verbalized understanding;Returned demonstration       PT Short Term Goals - 04/11/19 1204      PT SHORT TERM GOAL #1   Title  Pt to be independent with initial HEP    Time  2    Period  Weeks    Status  Achieved    Target Date  03/15/19      PT SHORT TERM GOAL #2   Title  Pt to report pain in back/leg to 4/10    Time  2    Period  Weeks    Status  Achieved    Target Date  03/15/19        PT Long Term Goals - 04/11/19 1205      PT LONG TERM GOAL #1   Title  Pt to be independent with final HEP    Time  6    Period  Weeks    Status  Achieved      PT LONG TERM GOAL #2   Title  Pt to report decreased pain to 0-2/10 in back and R LE.    Time  6    Period  Weeks     Status  Partially Met      PT LONG TERM GOAL #3   Title  Pt to demo improved strength of R LE to at least 4+/5 to improve stability and functional activity    Time  6    Period  Weeks    Status  Partially Met      PT LONG TERM GOAL #4   Title  Pt to demo ability for bend, squat, lift, with proper mechanics, and no pain, to improve ability for IADLs.    Time  6    Period  Weeks    Status  Partially Met            Plan - 04/11/19 1205    Clinical Impression Statement  Pt educated on HEP to continue until surgery for comfort. DIscussed home work Personal assistant for pre and post surgery. Pt will be discharged at this time, may return in future, post surgical if needed.    Personal Factors and Comorbidities  Fitness    Examination-Activity Limitations  Bathing;Locomotion Level;Transfers;Bend;Sit;Sleep;Squat;Stairs;Dressing;Stand;Lift    Examination-Participation Restrictions  Cleaning;Community Activity;Driving;Shop;Yard Work    [employer]chant [employer] officer  Evolving/Moderate complexity    Rehab Potential  Good    PT Frequency  2x / week    PT Duration  6 weeks    PT Treatment/Interventions  ADLs/Self Care Home Management;Cryotherapy;Electrical Stimulation;Iontophoresis 27m/ml Dexamethasone;Moist Heat;Traction;Ultrasound;DME Instruction;Neuromuscular re-education;Balance training;Therapeutic exercise;Therapeutic activities;Functional mobility training;Gait training;Stair training;Patient/family education;Orthotic Fit/Training;Manual techniques;Energy conservation;Dry needling;Passive range of motion;Taping;Spinal Manipulations;Joint Manipulations    Consulted and Agree with Plan of Care  Patient       Patient will benefit from skilled therapeutic intervention in order to improve the following deficits and impairments:  Decreased range of motion, Difficulty walking, Increased muscle spasms, Decreased endurance, Decreased activity tolerance,  Pain, Decreased balance, Hypomobility,  Impaired flexibility, Improper body mechanics, Decreased strength, Decreased mobility  Visit Diagnosis: Radiculopathy, lumbar region     Problem List Patient Active Problem List   Diagnosis Date Noted  . Cervical radiculopathy at C6 08/01/2018  . Radial nerve entrapment, right 07/04/2018  . Hyperlipidemia, unspecified 10/22/2017  . Secondary polycythemia 09/09/2017  . Lumbar back pain with radiculopathy affecting right lower extremity 08/29/2016  . Wart 02/27/2015  . Volar plate injury of finger 01/09/2015  . Depression, major 09/27/2014  . Rash 09/27/2014  . Erectile dysfunction 09/27/2014  . Vitamin D deficiency 07/23/2014  . 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 S/P partial thyroidectomy 02/28/2014  . Anxiety state 01/11/2014  . Obesity (BMI 35.0-39.9 without comorbidity) 01/11/2014  . Cyst of mandible 12/22/2013  . ADHD (attention deficit hyperactivity disorder) 06/02/2011    Lyndee Hensen, PT, DPT 12:08 PM  04/11/19    [facility] [place], [place], [address] Phone: [phone]   Fax:  [phone]  Name: Robert [name] MRN: [id number] Date of Birth: Jan 15, 1976   PHYSICAL THERAPY DISCHARGE SUMMARY  Visits from Start of Care: 6 Plan: Patient agrees to discharge.  Patient goals were partially met. Patient is being discharged due to a change in medical status.  ?????    Lyndee Hensen, PT, DPT 12:08 PM  04/11/19

## 2019-04-12 ENCOUNTER — Encounter: Payer: Commercial Managed Care - PPO | Admitting: Physical Therapy

## 2019-04-17 ENCOUNTER — Encounter: Payer: Commercial Managed Care - PPO | Admitting: Physical Therapy

## 2019-04-18 NOTE — H&P (Signed)
Patient ID:   [id number] Patient: Robert [name]  Date of Birth: 1975-11-27 Visit Type: Office Visit   Date: 04/10/2019 10:30 AM Provider: Danae Orleans. Venetia Maxon MD   This 44 year old male presents for Leg pain.  HISTORY OF PRESENT ILLNESS:  1.  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  Patient recalls some right buttock spasm and pain in November prior to a trip to [facility].  Steroid and physical therapy was prescribed.  During his travel, pain and spasm prevented ambulation.  He was seen at an emergency department in [facility], diagnosed with pars fracture, and transferred to another hospital facility for MRI.  HNP was diagnosed, steroid and pain medication were prescribed, and patient was discharged to follow-up here locally.  Symptoms subsided initially with steroid, but leg pain and numbness have increased over the last few days.  Neurontin 900 mg t.i.d. Left over Norco from November taken nightly recently Left over Flexeril from November taken nightly recently  Physical therapy has continued twice weekly with dry needling.  He has found significant relief with therapy until recently.  History:  OSA (CPAP), ADHD, hypothyroidism Surgical history:  Right hemithyroidectomy 2014, septoplasty 2010  Penicillin causes anaphylactic reaction  Lumbar MRI 02/19/2019 on canopy  The patient's x-rays revealed bilateral pars defects at L5.  At L5-S1 there is spondylolisthesis of 7 mm on neutral lateral radiograph, 5.3 mm on flexion and 7.5 mm on extension in addition the MRI of the lumbar spine reveals a large herniated disc on the right at the L5-S1 level causing significant right S1 nerve root compression.  The patient describes pain in his back and right leg at 7 to 8/10.  The patient says that his pain has been  severe but it is getting much worse over the last 7-10 days          PAST MEDICAL/SURGICAL HISTORY:   (Detailed)    Disease/disorder Onset Date Management Date Comments    Thyroidectomy    Sleep Apnea-CPAP      Thyroid disease      Elevated lipids         PAST MEDICAL HISTORY, SURGICAL HISTORY, FAMILY HISTORY, SOCIAL HISTORY AND REVIEW OF SYSTEMS I have reviewed the patient's past medical, surgical, family and social history as well as the comprehensive review of systems as included on the [facility] history form dated 03/13/2019, which I have signed.  Family History:  (Detailed) Relationship Family Member Name Deceased Age at Death Condition Onset Age Cause of Death  Father    Pancreatitis  N     Social History:  (Detailed) Tobacco use reviewed. Preferred language is [place].   Tobacco use status: Current non-smoker. Smoking status: Never smoker.  SMOKING STATUS Type Smoking Status Usage Per Day Years Used Total Pack Years   Never smoker          MEDICATIONS: (added, continued or stopped this visit) Started Medication Directions Instruction Stopped   Adderall XR 30 mg capsule,extended release      Neurontin 300 mg capsule take 3 capsule by oral route 3 times every day     thyroid (pork) 120 mg tablet        ALLERGIES: Ingredient Reaction Medication Name Comment  PENICILLINS      Reviewed, no changes.   REVIEW OF SYSTEMS  See scanned patient registration form, dated 03/13/2019, signed and dated on 04/10/2019  Review of Systems Details System Neg/Pos Details  Constitutional Negative Chills, Fatigue, Fever, Malaise, Night sweats, Weight gain and Weight loss.  ENMT Negative Ear drainage, Hearing loss, Nasal drainage, Otalgia, Sinus pressure and Sore throat.  Eyes Negative Eye discharge, Eye pain and Vision changes.  Respiratory Negative Chronic cough, Cough, Dyspnea, Known TB exposure and Wheezing.  Cardio Negative Chest  pain, Claudication, Edema and Irregular heartbeat/palpitations.  GI Negative Abdominal pain, Blood in stool, Change in stool pattern, Constipation, Decreased appetite, Diarrhea, Heartburn, Nausea and Vomiting.  GU Negative Dribbling, Dysuria, Erectile dysfunction, Hematuria, Polyuria (Genitourinary), Slow stream, Urinary frequency, Urinary incontinence and Urinary retention.  Endocrine Negative Cold intolerance, Heat intolerance, Polydipsia and Polyphagia.  Neuro Positive Gait disturbance, Numbness in extremity.  Psych Negative Anxiety, Depression and Insomnia.  Integumentary Negative Brittle hair, Brittle nails, Change in shape/size of mole(s), Hair loss, Hirsutism, Hives, Pruritus, Rash and Skin lesion.  MS Positive Back pain.  Hema/Lymph Negative Easy bleeding, Easy bruising and Lymphadenopathy.  Allergic/Immuno Negative Contact allergy, Environmental allergies, Food allergies and Seasonal allergies.  Reproductive Negative Penile discharge and Sexual dysfunction.   PHYSICAL EXAM:   Vitals Date Temp F BP Pulse Ht In Wt Lb BMI BSA Pain Score  04/10/2019 96.2 165/95 105 75 308.6 38.57  3/10    PHYSICAL EXAM Details General Level of Distress: no acute distress Overall Appearance: obese  Head and Face  Right Left  Fundoscopic Exam:  normal normal    Cardiovascular Cardiac: regular rate and rhythm without murmur  Right Left  Carotid Pulses: normal normal  Respiratory Lungs: clear to auscultation  Neurological Orientation: normal Recent and Remote Memory: normal Attention Span and Concentration:   normal Language: normal Fund of Knowledge: normal  Right Left Sensation: normal normal Upper Extremity Coordination: normal normal  Lower Extremity Coordination: normal normal  Musculoskeletal Gait and Station: normal  Right Left Upper Extremity Muscle Strength: normal normal Lower Extremity Muscle Strength: normal normal Upper Extremity Muscle Tone:  normal normal Lower  Extremity Muscle Tone: normal normal   Motor Strength Upper and lower extremity motor strength was tested in the clinically pertinent muscles. Any abnormal findings will be noted below.   Right Left EHL: 4/5    Deep Tendon Reflexes  Right Left Biceps: normal normal Triceps: normal normal Brachioradialis: normal normal Patellar: normal normal Achilles: normal normal  Sensory Sensation was tested at L1 to S1.   Cranial Nerves II. Optic Nerve/Visual Fields: normal III. Oculomotor: normal IV. Trochlear: normal V. Trigeminal: normal VI. Abducens: normal VII. Facial: normal VIII. Acoustic/Vestibular: normal IX. Glossopharyngeal: normal X. Vagus: normal XI. Spinal Accessory: normal XII. Hypoglossal: normal  Motor and other Tests Lhermittes: negative Rhomberg: negative Pronator drift: absent     Right Left Hoffman's: normal normal Clonus: normal normal Babinski: normal normal SLR: positive at 20 degrees negative Patrick's Pearlean Brownie): negative negative Toe Walk: normal normal Toe Lift: normal normal Heel Walk: normal normal SI Joint: nontender nontender   Additional Findings:  Patient is able to bend to within 12 in of the floor with his upper extremities outstretched.  He is able stand on his heels and toes.    IMPRESSION:   The patient has a severe right leg radiculopathy in an S1 distribution.  He also has weakness in his right extensor hallucis longus at 4/5 and a positive straight leg raise at 20 on the right.  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  PLAN:  I have recommended to the patient that he undergo L5 Gill with diskectomy and interbody grafting with pedicle screw fixation L5 through S1 levels.  I have set this up on an expedited basis given the severity of his pain and his significant imaging findings.  Risks and benefits were discussed in detail with the patient and he wishes to proceed with surgery.  He was fitted for LSO  brace today.  Orders: Diagnostic Procedures: Assessment Procedure  M54.16 Lumbar Spine- AP/Lat  M54.16 Lumbar Spine- AP/Lat/Obls/Spot/Flex/Ex  Instruction(s)/Education: Assessment Instruction  R03.0 Lifestyle education  [phone] Dietary management education, guidance, and counseling  Miscellaneous: Assessment   M54.16 LSO Brace   Completed Orders (this encounter) Order Details Reason Side Interpretation Result Initial Treatment Date Region  Lifestyle education Patient will monitor and contact primary care physician if needed.        Dietary management education, guidance, and counseling Encouraged patient to eat well balanced diet.        Lumbar Spine- AP/Lat/Obls/Spot/Flex/Ex      04/10/2019 All Levels to All Levels   Assessment/Plan   # Detail Type Description   1. 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       2. Assessment Spondylolisthesis, lumbosacral region (M43.17).       3. Assessment Lumbar radiculopathy (M54.16).   Plan Orders LSO Brace.       4. 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       5. Assessment Herniated nucleus pulposus, L5-S1 (M51.27).       6. Assessment Elevated blood-pressure reading, w/o diagnosis of htn (R03.0).       7. Assessment Body mass index (BMI) 38.0-38.9, adult (X91.47).   Plan Orders Today's instructions / counseling include(s) Dietary management education, guidance, and counseling. Clinical information/comments: Encouraged patient to eat well balanced diet.         Pain Management Plan Pain Scale: 3/10. Method: Numeric Pain Intensity Scale. Location: back. Onset: 04/10/2019. Duration: varies. Quality: discomforting. Pain management follow-up plan of care: Patient will continue medication management..              Provider:  Danae Orleans. Venetia Maxon MD  04/12/2019 04:08 PM    Dictation edited by: Danae Orleans. Venetia Maxon    CC  Providers: Tana Conch Sheldon @ [facility] [address] [place],  [place]  [address]-   Maeola Harman MD  [address] [place], [place] [address]               Electronically signed by Danae Orleans. Venetia Maxon MD on 04/12/2019 04:08 PM

## 2019-04-18 NOTE — Progress Notes (Signed)
[id number] [address], [place] [address] Phone: [phone] Fax: [facility] # [address] [place] [address] Phone: [phone] Fax: [phone]  [facility] [place], [place] [address] Phone: [phone] Fax: [phone]      Your procedure is scheduled on Friday, January 22nd.  Report to [facility] Main Entrance "A" at 10:30 A.M., and check in at the Admitting office.  Call this number if you have problems the morning of surgery:  [phone]  Call [phone] if you have any questions prior to your surgery date Monday-Friday 8am-4pm    Remember:  Do not eat or drink after midnight the night before your surgery     Take these medicines the morning of surgery with A SIP OF WATER   Armour thyroid  Gabapentin (Neurontin)  Lorazepam (Ativan)  7 days prior to surgery STOP taking any Aspirin (unless otherwise instructed by your surgeon), Aleve, Naproxen, Ibuprofen, Motrin, Advil, Goody's, BC's, all herbal medications, fish oil, and all vitamins.    The Morning of Surgery  Do not wear jewelry.  Do not wear lotions, powders, colognes, or deodorant  Men may shave face and neck.  Do not bring valuables to the hospital.  [facility] is not responsible for any belongings or valuables.  If you are a smoker, DO NOT Smoke 24 hours prior to surgery  If you wear a CPAP at night please bring your mask the morning of surgery   Remember that you must have someone to transport you home after your surgery, and remain with you for 24 hours if you are discharged the same day.   Please bring cases for contacts, glasses, hearing aids, dentures or bridgework because it cannot be worn into surgery.    Leave your suitcase in the car.  After surgery it may be brought to your room.  For  patients admitted to the hospital, discharge time will be determined by your treatment team.  Patients discharged the day of surgery will not be allowed to drive home.    Special instructions:   [facility]- Preparing For Surgery  Before surgery, you can play an important role. Because skin is not sterile, your skin needs to be as free of germs as possible. You can reduce the number of germs on your skin by washing with CHG (chlorahexidine gluconate) Soap before surgery.  CHG is an antiseptic cleaner which kills germs and bonds with the skin to continue killing germs even after washing.    Oral Hygiene is also important to reduce your risk of infection.  Remember - BRUSH YOUR TEETH THE MORNING OF SURGERY WITH YOUR REGULAR TOOTHPASTE  Please do not use if you have an allergy to CHG or antibacterial soaps. If your skin becomes reddened/irritated stop using the CHG.  Do not shave (including legs and underarms) for at least 48 hours prior to first CHG shower. It is OK to shave your face.  Please follow these instructions carefully.   1. Shower the NIGHT BEFORE SURGERY and the MORNING OF SURGERY with CHG Soap.   2. If you chose to wash your hair, wash your hair first as usual with your normal shampoo.  3. After you shampoo, rinse your hair and body thoroughly to remove the shampoo.  4. Use CHG as you would any other liquid soap.  You can apply CHG directly to the skin and wash gently with a scrungie or a clean washcloth.   5. Apply the CHG Soap to your body ONLY FROM THE NECK DOWN.  Do not use on open wounds or open sores. Avoid contact with your eyes, ears, mouth and genitals (private parts). Wash Face and genitals (private parts)  with your normal soap.   6. Wash thoroughly, paying special attention to the area where your surgery will be performed.  7. Thoroughly rinse your body with warm water from the neck down.  8. DO NOT shower/wash with your normal soap after using and rinsing off the  CHG Soap.  9. Pat yourself dry with a CLEAN TOWEL.  10. Wear CLEAN PAJAMAS to bed the night before surgery, wear comfortable clothes the morning of surgery  11. Place CLEAN SHEETS on your bed the night of your first shower and DO NOT SLEEP WITH PETS.    Day of Surgery:  Please shower the morning of surgery with the CHG soap Do not apply any deodorants/lotions. Please wear clean clothes to the hospital/surgery center.   Remember to brush your teeth WITH YOUR REGULAR TOOTHPASTE.   Please read over the following fact sheets that you were given.

## 2019-04-19 ENCOUNTER — Other Ambulatory Visit: Payer: Self-pay

## 2019-04-19 ENCOUNTER — Encounter (HOSPITAL_COMMUNITY): Payer: Self-pay

## 2019-04-19 ENCOUNTER — Encounter: Payer: Commercial Managed Care - PPO | Admitting: Physical Therapy

## 2019-04-19 ENCOUNTER — Encounter (HOSPITAL_COMMUNITY)
Admission: RE | Admit: 2019-04-19 | Discharge: 2019-04-19 | Disposition: A | Discharge status: Home / Self Care | Payer: Commercial Managed Care - PPO | Source: Ambulatory Visit | Attending: Neurosurgery | Admitting: Neurosurgery

## 2019-04-19 ENCOUNTER — Other Ambulatory Visit (HOSPITAL_COMMUNITY)
Admission: RE | Admit: 2019-04-19 | Discharge: 2019-04-19 | Disposition: A | Discharge status: Home / Self Care | Payer: Commercial Managed Care - PPO | Source: Ambulatory Visit | Attending: Neurosurgery | Admitting: Neurosurgery

## 2019-04-19 DIAGNOSIS — Z01818 Encounter for other preprocedural examination: Secondary | ICD-10-CM | POA: Insufficient documentation

## 2019-04-19 HISTORY — DX: Personal history of urinary calculi: Z87.442

## 2019-04-19 HISTORY — DX: Depression, unspecified: F32.A

## 2019-04-19 HISTORY — DX: Hypothyroidism, unspecified: E03.9

## 2019-04-19 HISTORY — DX: Essential (primary) hypertension: I10

## 2019-04-19 HISTORY — DX: Hyperlipidemia, unspecified: E78.5

## 2019-04-19 HISTORY — DX: Anxiety disorder, unspecified: F41.9

## 2019-04-19 LAB — CBC
HCT: 56.3 % — ABNORMAL HIGH (ref 39.0–52.0)
Hemoglobin: 18.5 g/dL — ABNORMAL HIGH (ref 13.0–17.0)
MCH: 28.5 pg (ref 26.0–34.0)
MCHC: 32.9 g/dL (ref 30.0–36.0)
MCV: 86.6 fL (ref 80.0–100.0)
Platelets: 277 10*3/uL (ref 150–400)
RBC: 6.5 MIL/uL — ABNORMAL HIGH (ref 4.22–5.81)
RDW: 13.8 % (ref 11.5–15.5)
WBC: 6.7 10*3/uL (ref 4.0–10.5)
nRBC: 0 % (ref 0.0–0.2)

## 2019-04-19 LAB — ABO/RH: ABO/RH(D): O NEG

## 2019-04-19 LAB — TYPE AND SCREEN
ABO/RH(D): O NEG
Antibody Screen: NEGATIVE

## 2019-04-19 LAB — SURGICAL PCR SCREEN
MRSA, PCR: NEGATIVE
Staphylococcus aureus: NEGATIVE

## 2019-04-19 LAB — SARS CORONAVIRUS 2 (TAT 6-24 HRS): SARS Coronavirus 2: NEGATIVE

## 2019-04-19 NOTE — Progress Notes (Signed)
Patient denies shortness of breath, fever, cough and chest pain.  PCP - Dr. Garret Reddish Cardiologist - denies  Chest x-ray - denies EKG - 04/19/19 Stress Test - denies ECHO - denies Cardiac Cath - denies  Sleep Study - Yes CPAP - Uses CPAP nightly.  Anesthesia review: No  Coronavirus Screening Have you experienced the following symptoms:  Cough yes/no: No Fever (>100.48F)  yes/no: No Runny nose yes/no: No Sore throat yes/no: No Difficulty breathing/shortness of breath  yes/no: No  Have you traveled in the last 14 days and where? yes/no: No  Patient verbalized understanding of instructions that were given to them at the PAT appointment.

## 2019-04-19 NOTE — Progress Notes (Signed)
[id number] [address], [place] [address] Phone: [phone] Fax: [facility] # [address] [place] [address] Phone: [phone] Fax: [phone]  [facility] [place], [place] [address] Phone: [phone] Fax: [phone]      Your procedure is scheduled on Friday, January 22nd.  Report to [facility] Main Entrance "A" at 10:30 A.M., and check in at the Admitting office.  Call this number if you have problems the morning of surgery:  [phone]  Call [phone] if you have any questions prior to your surgery date Monday-Friday 8am-4pm    Remember:  Do not eat or drink after midnight the night before your surgery     Take these medicines the morning of surgery with A SIP OF WATER   Armour thyroid  Gabapentin (Neurontin)  Lorazepam (Ativan) if needed  STOP now taking any Aspirin (unless otherwise instructed by your surgeon), Aleve, Naproxen, Ibuprofen, Motrin, Advil, Goody's, BC's, all herbal medications, fish oil, and all vitamins.    The Morning of Surgery  Do not wear jewelry.  Do not wear lotions, powders, colognes or deodorant  Men may shave face and neck.  Do not bring valuables to the hospital.  [facility] is not responsible for any belongings or valuables.  If you are a smoker, DO NOT Smoke 24 hours prior to surgery  If you wear a CPAP at night please bring your mask the morning of surgery   Remember that you must have someone to transport you home after your surgery, and remain with you for 24 hours if you are discharged the same day.   Please bring cases for contacts, glasses, hearing aids, dentures or bridgework because it cannot be worn into surgery.   Leave your suitcase in the car.  After surgery it may be brought to your room.  For patients admitted  to the hospital, discharge time will be determined by your treatment team.  Patients discharged the day of surgery will not be allowed to drive home.    Special instructions:   [facility]- Preparing For Surgery  Before surgery, you can play an important role. Because skin is not sterile, your skin needs to be as free of germs as possible. You can reduce the number of germs on your skin by washing with CHG (chlorahexidine gluconate) Soap before surgery.  CHG is an antiseptic cleaner which kills germs and bonds with the skin to continue killing germs even after washing.    Oral Hygiene is also important to reduce your risk of infection.  Remember - BRUSH YOUR TEETH THE MORNING OF SURGERY WITH YOUR REGULAR TOOTHPASTE  Please do not use if you have an allergy to CHG or antibacterial soaps. If your skin becomes reddened/irritated stop using the CHG.  Do not shave (including legs and underarms) for at least 48 hours prior to first CHG shower. It is OK to shave your face.  Please follow these instructions carefully.   1. Shower the NIGHT BEFORE SURGERY Thurs and the MORNING OF SURGERY Fri with CHG Soap.   2. If you chose to wash your hair, wash your hair first as usual with your normal shampoo.  3. After you shampoo, rinse your hair and body thoroughly to remove the shampoo.  4. Use CHG as you would any other liquid soap. You can  apply CHG directly to the skin and wash gently with a scrungie or a clean washcloth.   5. Apply the CHG Soap to your body ONLY FROM THE NECK DOWN.  Do not use on open wounds or open sores. Avoid contact with your eyes, ears, mouth and genitals (private parts). Wash Face and genitals (private parts)  with your normal soap.   6. Wash thoroughly, paying special attention to the area where your surgery will be performed.  7. Thoroughly rinse your body with warm water from the neck down.  8. DO NOT shower/wash with your normal soap after using and rinsing off the CHG  Soap.  9. Pat yourself dry with a CLEAN TOWEL.  10. Wear CLEAN PAJAMAS to bed the night before surgery, wear comfortable clothes the morning of surgery  11. Place CLEAN SHEETS on your bed the night of your first shower and DO NOT SLEEP WITH PETS.    Day of Surgery:  Please shower the morning of surgery with the CHG soap Do not apply any deodorants/lotions. Please wear clean clothes to the hospital/surgery center.   Remember to brush your teeth WITH YOUR REGULAR TOOTHPASTE.   Please read over the following fact sheets that you were given.

## 2019-04-20 ENCOUNTER — Ambulatory Visit (INDEPENDENT_AMBULATORY_CARE_PROVIDER_SITE_OTHER): Payer: Commercial Managed Care - PPO | Admitting: Psychology

## 2019-04-20 ENCOUNTER — Ambulatory Visit: Payer: Commercial Managed Care - PPO | Admitting: Psychology

## 2019-04-20 DIAGNOSIS — F4322 Adjustment disorder with anxiety: Secondary | ICD-10-CM

## 2019-04-20 MED ORDER — VANCOMYCIN HCL 1500 MG/300ML IV SOLN
1500.0000 mg | INTRAVENOUS | Status: AC
  Administered 2019-04-21: 12:00:00 1500 mg via INTRAVENOUS
  Filled 2019-04-20 (×2): qty 300

## 2019-04-21 ENCOUNTER — Encounter (HOSPITAL_COMMUNITY)
Admission: RE | Disposition: A | Discharge status: Home / Self Care | Payer: Self-pay | Source: Home / Self Care | Attending: Neurosurgery

## 2019-04-21 ENCOUNTER — Inpatient Hospital Stay (HOSPITAL_COMMUNITY): Payer: Commercial Managed Care - PPO | Admitting: Anesthesiology

## 2019-04-21 ENCOUNTER — Inpatient Hospital Stay (HOSPITAL_COMMUNITY): Payer: Commercial Managed Care - PPO

## 2019-04-21 ENCOUNTER — Encounter (HOSPITAL_COMMUNITY): Payer: Self-pay | Admitting: Neurosurgery

## 2019-04-21 ENCOUNTER — Other Ambulatory Visit: Payer: Self-pay

## 2019-04-21 ENCOUNTER — Inpatient Hospital Stay (HOSPITAL_COMMUNITY)
Admission: RE | Admit: 2019-04-21 | Discharge: 2019-04-22 | DRG: 455 | Disposition: A | Discharge status: Home / Self Care | Payer: Commercial Managed Care - PPO | Attending: Neurosurgery | Admitting: Neurosurgery

## 2019-04-21 DIAGNOSIS — Z20822 Contact with and (suspected) exposure to covid-19: Secondary | ICD-10-CM | POA: Diagnosis present

## 2019-04-21 DIAGNOSIS — M5117 Intervertebral disc disorders with radiculopathy, lumbosacral region: Secondary | ICD-10-CM | POA: Diagnosis present

## 2019-04-21 DIAGNOSIS — Z419 Encounter for procedure for purposes other than remedying health state, unspecified: Secondary | ICD-10-CM

## 2019-04-21 DIAGNOSIS — M4317 Spondylolisthesis, lumbosacral region: Secondary | ICD-10-CM | POA: Diagnosis present

## 2019-04-21 DIAGNOSIS — F909 Attention-deficit hyperactivity disorder, unspecified type: Secondary | ICD-10-CM | POA: Diagnosis present

## 2019-04-21 DIAGNOSIS — G4733 Obstructive sleep apnea (adult) (pediatric): Secondary | ICD-10-CM | POA: Diagnosis present

## 2019-04-21 DIAGNOSIS — Z880 Allergy status to penicillin: Secondary | ICD-10-CM

## 2019-04-21 DIAGNOSIS — M4306 Spondylolysis, lumbar region: Secondary | ICD-10-CM | POA: Diagnosis present

## 2019-04-21 LAB — BASIC METABOLIC PANEL
Anion gap: 8 (ref 5–15)
BUN: 18 mg/dL (ref 6–20)
CO2: 25 mmol/L (ref 22–32)
Calcium: 8.8 mg/dL — ABNORMAL LOW (ref 8.9–10.3)
Chloride: 108 mmol/L (ref 98–111)
Creatinine, Ser: 0.99 mg/dL (ref 0.61–1.24)
GFR calc Af Amer: >60 mL/min (ref >60)
GFR calc non Af Amer: >60 mL/min (ref >60)
Glucose, Bld: 94 mg/dL (ref 70–99)
Potassium: 4.6 mmol/L (ref 3.5–5.1)
Sodium: 141 mmol/L (ref 135–145)

## 2019-04-21 SURGERY — POSTERIOR LUMBAR FUSION 1 LEVEL
Anesthesia: General | Site: Spine Lumbar

## 2019-04-21 MED ORDER — HYDROCODONE-ACETAMINOPHEN 5-325 MG PO TABS
ORAL_TABLET | ORAL | Status: AC
  Filled 2019-04-21: qty 2

## 2019-04-21 MED ORDER — BUPIVACAINE LIPOSOME 1.3 % IJ SUSP
20.0000 mL | Freq: Once | INTRAMUSCULAR | Status: DC
  Filled 2019-04-21: qty 20

## 2019-04-21 MED ORDER — ONDANSETRON HCL 4 MG/2ML IJ SOLN: 4.0000 mg | Freq: Four times a day (QID) | INTRAMUSCULAR | Status: DC | PRN

## 2019-04-21 MED ORDER — ROCURONIUM BROMIDE 10 MG/ML (PF) SYRINGE
PREFILLED_SYRINGE | INTRAVENOUS | Status: DC | PRN
  Administered 2019-04-21: 20 mg via INTRAVENOUS
  Administered 2019-04-21: 60 mg via INTRAVENOUS
  Administered 2019-04-21 (×3): 20 mg via INTRAVENOUS

## 2019-04-21 MED ORDER — VITAMIN D 25 MCG (1000 UNIT) PO TABS
1000.0000 [IU] | ORAL_TABLET | Freq: Every day | ORAL | Status: DC
  Administered 2019-04-22: 10:00:00 1000 [IU] via ORAL
  Filled 2019-04-21 (×3): qty 1

## 2019-04-21 MED ORDER — PROPOFOL 10 MG/ML IV BOLUS
INTRAVENOUS | Status: DC | PRN
  Administered 2019-04-21: 200 mg via INTRAVENOUS

## 2019-04-21 MED ORDER — LORAZEPAM 0.5 MG PO TABS: 0.5000 mg | ORAL_TABLET | Freq: Two times a day (BID) | ORAL | Status: DC | PRN

## 2019-04-21 MED ORDER — METHOCARBAMOL 1000 MG/10ML IJ SOLN
500.0000 mg | Freq: Four times a day (QID) | INTRAVENOUS | Status: DC | PRN
  Filled 2019-04-21 (×2): qty 5

## 2019-04-21 MED ORDER — SODIUM CHLORIDE 0.9% FLUSH
3.0000 mL | Freq: Two times a day (BID) | INTRAVENOUS | Status: DC
  Administered 2019-04-21: 3 mL via INTRAVENOUS

## 2019-04-21 MED ORDER — PANTOPRAZOLE SODIUM 40 MG PO TBEC
40.0000 mg | DELAYED_RELEASE_TABLET | Freq: Every day | ORAL | Status: DC
  Administered 2019-04-21: 21:00:00 40 mg via ORAL
  Filled 2019-04-21: qty 1

## 2019-04-21 MED ORDER — ROCURONIUM BROMIDE 10 MG/ML (PF) SYRINGE
PREFILLED_SYRINGE | INTRAVENOUS | Status: AC
  Filled 2019-04-21: qty 10

## 2019-04-21 MED ORDER — FENTANYL CITRATE (PF) 100 MCG/2ML IJ SOLN
INTRAMUSCULAR | Status: AC
  Filled 2019-04-21: qty 2

## 2019-04-21 MED ORDER — PROMETHAZINE HCL 25 MG/ML IJ SOLN
6.2500 mg | INTRAMUSCULAR | Status: DC | PRN
  Administered 2019-04-21: 17:00:00 12.5 mg via INTRAVENOUS

## 2019-04-21 MED ORDER — GABAPENTIN 300 MG PO CAPS
900.0000 mg | ORAL_CAPSULE | Freq: Three times a day (TID) | ORAL | Status: DC
  Administered 2019-04-21 – 2019-04-22 (×2): 900 mg via ORAL
  Filled 2019-04-21 (×2): qty 3

## 2019-04-21 MED ORDER — AMPHETAMINE-DEXTROAMPHET ER 30 MG PO CP24: 30.0000 mg | ORAL_CAPSULE | Freq: Every day | ORAL | Status: DC

## 2019-04-21 MED ORDER — SUMATRIPTAN SUCCINATE 50 MG PO TABS
50.0000 mg | ORAL_TABLET | ORAL | Status: DC | PRN
  Filled 2019-04-21: qty 1

## 2019-04-21 MED ORDER — CELECOXIB 200 MG PO CAPS: 400.0000 mg | ORAL_CAPSULE | Freq: Once | ORAL | Status: AC

## 2019-04-21 MED ORDER — MENTHOL 3 MG MT LOZG: 1.0000 | LOZENGE | OROMUCOSAL | Status: DC | PRN

## 2019-04-21 MED ORDER — THYROID 120 MG PO TABS
120.0000 mg | ORAL_TABLET | Freq: Every day | ORAL | Status: DC
  Administered 2019-04-22: 10:00:00 120 mg via ORAL
  Filled 2019-04-21: qty 1

## 2019-04-21 MED ORDER — BUPIVACAINE LIPOSOME 1.3 % IJ SUSP
INTRAMUSCULAR | Status: DC | PRN
  Administered 2019-04-21: 20 mL

## 2019-04-21 MED ORDER — KCL IN DEXTROSE-NACL 20-5-0.45 MEQ/L-%-% IV SOLN: INTRAVENOUS | Status: DC

## 2019-04-21 MED ORDER — ACETAMINOPHEN 500 MG PO TABS: 1000.0000 mg | ORAL_TABLET | Freq: Once | ORAL | Status: AC

## 2019-04-21 MED ORDER — FENTANYL CITRATE (PF) 250 MCG/5ML IJ SOLN
INTRAMUSCULAR | Status: AC
  Filled 2019-04-21: qty 5

## 2019-04-21 MED ORDER — CHLORHEXIDINE GLUCONATE CLOTH 2 % EX PADS: 6.0000 | MEDICATED_PAD | Freq: Once | CUTANEOUS | Status: DC

## 2019-04-21 MED ORDER — ALUM & MAG HYDROXIDE-SIMETH 200-200-20 MG/5ML PO SUSP: 30.0000 mL | Freq: Four times a day (QID) | ORAL | Status: DC | PRN

## 2019-04-21 MED ORDER — BISACODYL 10 MG RE SUPP: 10.0000 mg | Freq: Every day | RECTAL | Status: DC | PRN

## 2019-04-21 MED ORDER — SUGAMMADEX SODIUM 200 MG/2ML IV SOLN
INTRAVENOUS | Status: DC | PRN
  Administered 2019-04-21: 300 mg via INTRAVENOUS

## 2019-04-21 MED ORDER — METHOCARBAMOL 500 MG PO TABS
500.0000 mg | ORAL_TABLET | Freq: Four times a day (QID) | ORAL | Status: DC | PRN
  Administered 2019-04-21 – 2019-04-22 (×3): 500 mg via ORAL
  Filled 2019-04-21 (×2): qty 1

## 2019-04-21 MED ORDER — PHENYLEPHRINE 40 MCG/ML (10ML) SYRINGE FOR IV PUSH (FOR BLOOD PRESSURE SUPPORT)
PREFILLED_SYRINGE | INTRAVENOUS | Status: AC
  Filled 2019-04-21: qty 10

## 2019-04-21 MED ORDER — ONDANSETRON HCL 4 MG PO TABS: 4.0000 mg | ORAL_TABLET | Freq: Four times a day (QID) | ORAL | Status: DC | PRN

## 2019-04-21 MED ORDER — ACETAMINOPHEN 500 MG PO TABS
ORAL_TABLET | ORAL | Status: AC
  Administered 2019-04-21: 1000 mg via ORAL
  Filled 2019-04-21: qty 2

## 2019-04-21 MED ORDER — LIDOCAINE-EPINEPHRINE 1 %-1:100000 IJ SOLN
INTRAMUSCULAR | Status: DC | PRN
  Administered 2019-04-21: 5 mL

## 2019-04-21 MED ORDER — LIDOCAINE 2% (20 MG/ML) 5 ML SYRINGE
INTRAMUSCULAR | Status: AC
  Filled 2019-04-21: qty 5

## 2019-04-21 MED ORDER — PROPOFOL 10 MG/ML IV BOLUS
INTRAVENOUS | Status: AC
  Filled 2019-04-21: qty 20

## 2019-04-21 MED ORDER — SUCCINYLCHOLINE CHLORIDE 200 MG/10ML IV SOSY
PREFILLED_SYRINGE | INTRAVENOUS | Status: AC
  Filled 2019-04-21: qty 10

## 2019-04-21 MED ORDER — PHENOL 1.4 % MT LIQD: 1.0000 | OROMUCOSAL | Status: DC | PRN

## 2019-04-21 MED ORDER — ONDANSETRON HCL 4 MG/2ML IJ SOLN
INTRAMUSCULAR | Status: AC
  Filled 2019-04-21: qty 2

## 2019-04-21 MED ORDER — FENTANYL CITRATE (PF) 100 MCG/2ML IJ SOLN
25.0000 ug | INTRAMUSCULAR | Status: DC | PRN
  Administered 2019-04-21: 25 ug via INTRAVENOUS

## 2019-04-21 MED ORDER — PANTOPRAZOLE SODIUM 40 MG IV SOLR: 40.0000 mg | Freq: Every day | INTRAVENOUS | Status: DC

## 2019-04-21 MED ORDER — LIDOCAINE 2% (20 MG/ML) 5 ML SYRINGE
INTRAMUSCULAR | Status: DC | PRN
  Administered 2019-04-21: 100 mg via INTRAVENOUS

## 2019-04-21 MED ORDER — MIDAZOLAM HCL 2 MG/2ML IJ SOLN
INTRAMUSCULAR | Status: AC
  Filled 2019-04-21: qty 2

## 2019-04-21 MED ORDER — VANCOMYCIN HCL 1500 MG/300ML IV SOLN
1500.0000 mg | Freq: Once | INTRAVENOUS | Status: AC
  Administered 2019-04-21: 23:00:00 1500 mg via INTRAVENOUS
  Filled 2019-04-21: qty 300

## 2019-04-21 MED ORDER — OXYCODONE HCL 5 MG PO TABS
5.0000 mg | ORAL_TABLET | ORAL | Status: DC | PRN
  Administered 2019-04-22: 10:00:00 10 mg via ORAL
  Filled 2019-04-21: qty 2

## 2019-04-21 MED ORDER — HYDROCODONE-ACETAMINOPHEN 5-325 MG PO TABS
1.0000 | ORAL_TABLET | ORAL | Status: DC | PRN
  Administered 2019-04-22 (×2): 2 via ORAL
  Filled 2019-04-21 (×2): qty 2

## 2019-04-21 MED ORDER — THROMBIN 5000 UNITS EX SOLR
OROMUCOSAL | Status: DC | PRN
  Administered 2019-04-21: 5 mL via TOPICAL

## 2019-04-21 MED ORDER — FLEET ENEMA 7-19 GM/118ML RE ENEM: 1.0000 | ENEMA | Freq: Once | RECTAL | Status: DC | PRN

## 2019-04-21 MED ORDER — CYCLOBENZAPRINE HCL 10 MG PO TABS: 10.0000 mg | ORAL_TABLET | Freq: Three times a day (TID) | ORAL | Status: DC | PRN

## 2019-04-21 MED ORDER — ZOLPIDEM TARTRATE 5 MG PO TABS: 5.0000 mg | ORAL_TABLET | Freq: Every evening | ORAL | Status: DC | PRN

## 2019-04-21 MED ORDER — BUPIVACAINE HCL (PF) 0.5 % IJ SOLN
INTRAMUSCULAR | Status: DC | PRN
  Administered 2019-04-21: 5 mL

## 2019-04-21 MED ORDER — METHOCARBAMOL 500 MG PO TABS
ORAL_TABLET | ORAL | Status: AC
  Filled 2019-04-21: qty 1

## 2019-04-21 MED ORDER — DEXAMETHASONE SODIUM PHOSPHATE 10 MG/ML IJ SOLN
INTRAMUSCULAR | Status: DC | PRN
  Administered 2019-04-21: 10 mg via INTRAVENOUS

## 2019-04-21 MED ORDER — 0.9 % SODIUM CHLORIDE (POUR BTL) OPTIME
TOPICAL | Status: DC | PRN
  Administered 2019-04-21: 1000 mL

## 2019-04-21 MED ORDER — SODIUM CHLORIDE 0.9% FLUSH: 3.0000 mL | INTRAVENOUS | Status: DC | PRN

## 2019-04-21 MED ORDER — FENTANYL CITRATE (PF) 250 MCG/5ML IJ SOLN
INTRAMUSCULAR | Status: DC | PRN
  Administered 2019-04-21 (×3): 50 ug via INTRAVENOUS
  Administered 2019-04-21: 100 ug via INTRAVENOUS
  Administered 2019-04-21 (×5): 50 ug via INTRAVENOUS

## 2019-04-21 MED ORDER — DOCUSATE SODIUM 100 MG PO CAPS
100.0000 mg | ORAL_CAPSULE | Freq: Two times a day (BID) | ORAL | Status: DC
  Administered 2019-04-21 – 2019-04-22 (×2): 100 mg via ORAL
  Filled 2019-04-21 (×2): qty 1

## 2019-04-21 MED ORDER — BUPIVACAINE HCL (PF) 0.5 % IJ SOLN
INTRAMUSCULAR | Status: AC
  Filled 2019-04-21: qty 30

## 2019-04-21 MED ORDER — THROMBIN 5000 UNITS EX SOLR
CUTANEOUS | Status: AC
  Filled 2019-04-21: qty 5000

## 2019-04-21 MED ORDER — SODIUM CHLORIDE 0.9 % IV SOLN: 250.0000 mL | INTRAVENOUS | Status: DC

## 2019-04-21 MED ORDER — SUCCINYLCHOLINE CHLORIDE 200 MG/10ML IV SOSY
PREFILLED_SYRINGE | INTRAVENOUS | Status: DC | PRN
  Administered 2019-04-21: 140 mg via INTRAVENOUS

## 2019-04-21 MED ORDER — PHENYLEPHRINE HCL-NACL 10-0.9 MG/250ML-% IV SOLN
INTRAVENOUS | Status: DC | PRN
  Administered 2019-04-21: 20 ug/min via INTRAVENOUS

## 2019-04-21 MED ORDER — CELECOXIB 200 MG PO CAPS
ORAL_CAPSULE | ORAL | Status: AC
  Administered 2019-04-21: 11:00:00 400 mg via ORAL
  Filled 2019-04-21: qty 2

## 2019-04-21 MED ORDER — PHENYLEPHRINE 40 MCG/ML (10ML) SYRINGE FOR IV PUSH (FOR BLOOD PRESSURE SUPPORT)
PREFILLED_SYRINGE | INTRAVENOUS | Status: DC | PRN
  Administered 2019-04-21: 40 ug via INTRAVENOUS
  Administered 2019-04-21 (×2): 80 ug via INTRAVENOUS

## 2019-04-21 MED ORDER — MIDAZOLAM HCL 5 MG/5ML IJ SOLN
INTRAMUSCULAR | Status: DC | PRN
  Administered 2019-04-21: 2 mg via INTRAVENOUS

## 2019-04-21 MED ORDER — ACETAMINOPHEN 650 MG RE SUPP: 650.0000 mg | RECTAL | Status: DC | PRN

## 2019-04-21 MED ORDER — POLYETHYLENE GLYCOL 3350 17 G PO PACK: 17.0000 g | PACK | Freq: Every day | ORAL | Status: DC | PRN

## 2019-04-21 MED ORDER — PROMETHAZINE HCL 25 MG/ML IJ SOLN
INTRAMUSCULAR | Status: AC
  Filled 2019-04-21: qty 1

## 2019-04-21 MED ORDER — LACTATED RINGERS IV SOLN: INTRAVENOUS | Status: DC | PRN

## 2019-04-21 MED ORDER — HYDROCODONE-ACETAMINOPHEN 5-325 MG PO TABS
2.0000 | ORAL_TABLET | ORAL | Status: DC | PRN
  Administered 2019-04-21: 2 via ORAL

## 2019-04-21 MED ORDER — ONDANSETRON HCL 4 MG/2ML IJ SOLN
INTRAMUSCULAR | Status: DC | PRN
  Administered 2019-04-21: 4 mg via INTRAVENOUS

## 2019-04-21 MED ORDER — HYDROMORPHONE HCL 1 MG/ML IJ SOLN
1.0000 mg | INTRAMUSCULAR | Status: DC | PRN
  Administered 2019-04-21: 1 mg via INTRAVENOUS
  Filled 2019-04-21: qty 1

## 2019-04-21 MED ORDER — LIDOCAINE-EPINEPHRINE 1 %-1:100000 IJ SOLN
INTRAMUSCULAR | Status: AC
  Filled 2019-04-21: qty 1

## 2019-04-21 MED ORDER — ASCORBIC ACID 500 MG PO TABS
1000.0000 mg | ORAL_TABLET | Freq: Every day | ORAL | Status: DC
  Administered 2019-04-22: 10:00:00 1000 mg via ORAL
  Filled 2019-04-21: qty 2

## 2019-04-21 MED ORDER — TAMSULOSIN HCL 0.4 MG PO CAPS
0.4000 mg | ORAL_CAPSULE | Freq: Every day | ORAL | Status: DC
  Administered 2019-04-22: 10:00:00 0.4 mg via ORAL
  Filled 2019-04-21: qty 1

## 2019-04-21 MED ORDER — ACETAMINOPHEN 325 MG PO TABS
650.0000 mg | ORAL_TABLET | ORAL | Status: DC | PRN
  Administered 2019-04-22: 10:00:00 650 mg via ORAL
  Filled 2019-04-21: qty 2

## 2019-04-21 MED ORDER — HYDROCODONE-ACETAMINOPHEN 5-325 MG PO TABS: 1.0000 | ORAL_TABLET | Freq: Four times a day (QID) | ORAL | Status: DC | PRN

## 2019-04-21 SURGICAL SUPPLY — 78 items
BASKET BONE COLLECTION (BASKET) ×3 IMPLANT
BENZOIN TINCTURE PRP APPL 2/3 (GAUZE/BANDAGES/DRESSINGS) ×3 IMPLANT
BLADE CLIPPER SURG (BLADE) ×3 IMPLANT
BUR MATCHSTICK NEURO 3.0 LAGG (BURR) ×3 IMPLANT
BUR PRECISION FLUTE 5.0 (BURR) ×3 IMPLANT
CAGE PLIF 8X9X23-12 LUMBAR (Cage) ×6 IMPLANT
CANISTER SUCT 3000ML PPV (MISCELLANEOUS) ×3 IMPLANT
CARTRIDGE OIL MAESTRO DRILL (MISCELLANEOUS) ×1 IMPLANT
CLOSURE WOUND 1/2 X4 (GAUZE/BANDAGES/DRESSINGS) ×1
CONT SPEC 4OZ CLIKSEAL STRL BL (MISCELLANEOUS) ×3 IMPLANT
COVER BACK TABLE 60X90IN (DRAPES) ×3 IMPLANT
COVER WAND RF STERILE (DRAPES) IMPLANT
DECANTER SPIKE VIAL GLASS SM (MISCELLANEOUS) ×3 IMPLANT
DERMABOND ADVANCED (GAUZE/BANDAGES/DRESSINGS) ×2
DERMABOND ADVANCED .7 DNX12 (GAUZE/BANDAGES/DRESSINGS) ×1 IMPLANT
DIFFUSER DRILL AIR PNEUMATIC (MISCELLANEOUS) ×3 IMPLANT
DRAPE C-ARM 42X72 X-RAY (DRAPES) ×3 IMPLANT
DRAPE C-ARMOR (DRAPES) ×3 IMPLANT
DRAPE LAPAROTOMY 100X72X124 (DRAPES) ×3 IMPLANT
DRAPE SURG 17X23 STRL (DRAPES) ×3 IMPLANT
DRSG OPSITE POSTOP 4X6 (GAUZE/BANDAGES/DRESSINGS) ×3 IMPLANT
DURAPREP 26ML APPLICATOR (WOUND CARE) ×3 IMPLANT
ELECT BLADE 4.0 EZ CLEAN MEGAD (MISCELLANEOUS) ×3
ELECT REM PT RETURN 9FT ADLT (ELECTROSURGICAL) ×3
ELECTRODE BLDE 4.0 EZ CLN MEGD (MISCELLANEOUS) ×1 IMPLANT
ELECTRODE REM PT RTRN 9FT ADLT (ELECTROSURGICAL) ×1 IMPLANT
EVACUATOR 1/8 PVC DRAIN (DRAIN) IMPLANT
GAUZE 4X4 16PLY RFD (DISPOSABLE) IMPLANT
GAUZE SPONGE 4X4 12PLY STRL (GAUZE/BANDAGES/DRESSINGS) IMPLANT
GLOVE BIO SURGEON STRL SZ8 (GLOVE) ×6 IMPLANT
GLOVE BIOGEL PI IND STRL 7.0 (GLOVE) ×2 IMPLANT
GLOVE BIOGEL PI IND STRL 8 (GLOVE) ×3 IMPLANT
GLOVE BIOGEL PI IND STRL 8.5 (GLOVE) ×2 IMPLANT
GLOVE BIOGEL PI INDICATOR 7.0 (GLOVE) ×4
GLOVE BIOGEL PI INDICATOR 8 (GLOVE) ×6
GLOVE BIOGEL PI INDICATOR 8.5 (GLOVE) ×4
GLOVE ECLIPSE 7.5 STRL STRAW (GLOVE) ×9 IMPLANT
GLOVE ECLIPSE 8.0 STRL XLNG CF (GLOVE) ×6 IMPLANT
GLOVE EXAM NITRILE XL STR (GLOVE) IMPLANT
GOWN STRL REUS W/ TWL LRG LVL3 (GOWN DISPOSABLE) IMPLANT
GOWN STRL REUS W/ TWL XL LVL3 (GOWN DISPOSABLE) ×2 IMPLANT
GOWN STRL REUS W/TWL 2XL LVL3 (GOWN DISPOSABLE) ×6 IMPLANT
GOWN STRL REUS W/TWL LRG LVL3 (GOWN DISPOSABLE)
GOWN STRL REUS W/TWL XL LVL3 (GOWN DISPOSABLE) ×4
HEMOSTAT POWDER KIT SURGIFOAM (HEMOSTASIS) ×3 IMPLANT
KIT BASIN OR (CUSTOM PROCEDURE TRAY) ×3 IMPLANT
KIT POSITION SURG JACKSON T1 (MISCELLANEOUS) ×3 IMPLANT
KIT TURNOVER KIT B (KITS) ×3 IMPLANT
MILL MEDIUM DISP (BLADE) ×3 IMPLANT
NEEDLE HYPO 21X1.5 SAFETY (NEEDLE) ×3 IMPLANT
NEEDLE HYPO 25X1 1.5 SAFETY (NEEDLE) ×3 IMPLANT
NEEDLE SPNL 18GX3.5 QUINCKE PK (NEEDLE) ×3 IMPLANT
NS IRRIG 1000ML POUR BTL (IV SOLUTION) ×3 IMPLANT
OIL CARTRIDGE MAESTRO DRILL (MISCELLANEOUS) ×3
PACK LAMINECTOMY NEURO (CUSTOM PROCEDURE TRAY) ×3 IMPLANT
PAD ARMBOARD 7.5X6 YLW CONV (MISCELLANEOUS) ×9 IMPLANT
PATTIES SURGICAL .5 X.5 (GAUZE/BANDAGES/DRESSINGS) IMPLANT
PATTIES SURGICAL .5 X1 (DISPOSABLE) IMPLANT
PATTIES SURGICAL 1X1 (DISPOSABLE) IMPLANT
ROD RELINE-O LORD 5.5X40 (Rod) ×6 IMPLANT
SCREW LOCK RELINE 5.5 TULIP (Screw) ×12 IMPLANT
SCREW RELINE-O POLY 7.5X45 (Screw) ×6 IMPLANT
SCREW RELINE-O POLY 7.5X50 (Screw) ×4 IMPLANT
SCREW RLINE PLY 2S 50X7.5XPA (Screw) ×2 IMPLANT
SPONGE LAP 4X18 RFD (DISPOSABLE) IMPLANT
SPONGE SURGIFOAM ABS GEL 100 (HEMOSTASIS) IMPLANT
STAPLER SKIN PROX WIDE 3.9 (STAPLE) IMPLANT
STRIP CLOSURE SKIN 1/2X4 (GAUZE/BANDAGES/DRESSINGS) ×2 IMPLANT
SUT VIC AB 1 CT1 18XBRD ANBCTR (SUTURE) ×1 IMPLANT
SUT VIC AB 1 CT1 8-18 (SUTURE) ×2
SUT VIC AB 2-0 CT1 18 (SUTURE) ×6 IMPLANT
SUT VIC AB 3-0 SH 8-18 (SUTURE) ×3 IMPLANT
SYR 20ML LL LF (SYRINGE) ×3 IMPLANT
SYR 5ML LL (SYRINGE) IMPLANT
TOWEL GREEN STERILE (TOWEL DISPOSABLE) ×3 IMPLANT
TOWEL GREEN STERILE FF (TOWEL DISPOSABLE) ×3 IMPLANT
TRAY FOLEY MTR SLVR 16FR STAT (SET/KITS/TRAYS/PACK) ×3 IMPLANT
WATER STERILE IRR 1000ML POUR (IV SOLUTION) ×3 IMPLANT

## 2019-04-21 NOTE — Anesthesia Postprocedure Evaluation (Signed)
Anesthesia Post Note  Patient: [name]  Procedure(s) Performed: LUMBAR FIVE GILL, LUMBAR FIVE- SACRAL ONEPOSTERIOR LUMBAR INTERBODY  FUSION (N/A Spine Lumbar)     Patient location during evaluation: PACU Anesthesia Type: General Level of consciousness: sedated Pain management: pain level controlled Vital Signs Assessment: post-procedure vital signs reviewed and stable Respiratory status: spontaneous breathing and respiratory function stable Cardiovascular status: stable Postop Assessment: no apparent nausea or vomiting Anesthetic complications: no    Last Vitals:  Vitals:   04/21/19 1720 04/21/19 1735  BP: (!) 148/82 (!) 122/93  Pulse: 100 89  Resp: 20 12  Temp:    SpO2: 90% 93%    Last Pain:  Vitals:   04/21/19 1735  TempSrc:   PainSc: 7                  Adiana Smelcer DANIEL

## 2019-04-21 NOTE — Brief Op Note (Addendum)
04/21/2019  5:15 PM  PATIENT:  Robert [name]  44 y.o. 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 S 1 with HNP, lumbago, radiculopathy  PROCEDURE:  Procedure(s) with comments: LUMBAR FIVE GILL, LUMBAR FIVE- SACRAL ONEPOSTERIOR LUMBAR INTERBODY  FUSION (N/A) - posterior with PEEK cages, pedicle screw fixation, posterolateral arthrodesis  SURGEON:  Surgeon(s) and Role:    Erline Levine, MD - Primary    * Kary Kos, MD - Assisting  PHYSICIAN ASSISTANT:   ASSISTANTS: Poteat, RN   ANESTHESIA:   general  EBL:  200 mL   BLOOD ADMINISTERED:none  DRAINS: none   LOCAL MEDICATIONS USED:  MARCAINE    and LIDOCAINE   SPECIMEN:  No Specimen  DISPOSITION OF SPECIMEN:  N/A  COUNTS:  YES  TOURNIQUET:  * No tourniquets in log *  DICTATION: Patient is 44 year old woman with mobile spondylolisthesis of L 5 on S 1 with lumbar disc herniation and radiculopathyy. He has a severe right  S 1 radiculopathy. It was elected to take him to surgery for decompression and fusion at this level.   Procedure: Patient was placed in a prone position on the [place] table after smooth and uncomplicated induction of general endotracheal anesthesia. His low back was prepped and draped in usual sterile fashion with betadine scrub and DuraPrep after marking anatomy with C arm. Area of incision was infiltrated with local lidocaine. Incision was made to the lumbodorsal fascia was incised and exposure was performed of the L 5 through S 1 spinous processes laminae facet joint and transverse processes. Intraoperative x-ray was obtained which confirmed correct orientation. A Gill procedure of L 5 was performed with disarticulation of the facet joints at this level and thorough decompression was performed of both L 5 and S 1 nerve roots along with the common dural tube.  This decompression was more involved than would be typical of that performed for PLIF alone and included painstaking dissection of adherent ligament compressing the thecal sac and wide decompression of all neural elements. A thorough discectomy was initially performed on the left with preparation of the endplates for grafting a trial spacer was placed this level and a thorough discectomy was performed on the right as well. A large disc herniation was carefully removed on the right with decompression of the S 1 nerve root. Bilateral median 8 x 9 x 23 mm x 12 degree mm peek cages were packed with autograft  and were inserted the interspace and countersunk appropriately along with 10 cc of morselized bone autograft. The posterolateral region was extensively decorticated and pedicle probes were placed at L 5 and S 1 bilaterally. Intraoperative fluoroscopy confirmed correct orientationin the AP and lateral plane. 50 x 7.5 mm pedicle screws were placed at L5 bilaterally and 45 x 7.5 mm screws placed at S 1 bilaterally final x-rays demonstrated well-positioned interbody grafts and pedicle screw fixation. A 40 mm lordotic rod was placed on the right and a 40 mm rod was placed on the left locked down in situ and the posterolateral region was packed with the remaining autograft (15 cc on each side). The wound was irrigated and long-acting Marcaine was injected in the deep musculature. Fascia was closed with 1 Vicryl sutures skin edges were reapproximated 2 and 3-0 Vicryl sutures. The wound is dressed with Dermabond and an occlusive dressing the patient was extubated in  the operating room and taken to recovery in stable satisfactory condition. He tolerated the operation well counts were correct at the end of the case.   PLAN OF CARE: Admit to inpatient   PATIENT DISPOSITION:  PACU - hemodynamically stable.   Delay start of Pharmacological VTE agent (>24hrs) due to surgical blood loss or risk of bleeding: yes

## 2019-04-21 NOTE — Progress Notes (Signed)
Pharmacy Antibiotic Note  Robert [name] is a 44 y.o. 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  Pt is S/P lumbar fusion this afternoon. Pharmacy has been consulted for vancomycin dosing for surgical prophylaxis (hx of anaphylaxis with PCN). Per neurosurgeon op note, pt has no drains in place post op  Pt rec'd vancomycin 1500 mg IV X 1 at 1157 AM today pre op.  WBC 6.7, afebrile, Scr 0.99, CrCl >100 ml/min  Plan: Vancomycin 1500 mg IV X 1 ~12 hrs after pre op dose (post op dose scheduled for 2330 tonight)  Height: 6\' 3"  (190.5 cm) Weight: (!) 305 lb 11.2 oz (138.7 kg) IBW/kg (Calculated) : 84.5  Temp (24hrs), Avg:98.1 F (36.7 C), Min:97.9 F (36.6 C), Max:98.3 F (36.8 C)  Recent Labs  Lab 04/19/19 0940 04/21/19 1015  WBC 6.7  --   CREATININE  --  0.99    Estimated Creatinine Clearance: 147.5 mL/min (by C-G formula based on SCr of 0.99 mg/dL).    Allergies  Allergen Reactions  . Penicillins Anaphylaxis  . Milk-Related Compounds Other (See Comments)    GI issues   . Morphine And Related Itching    Full body itching  . Topamax Other (See Comments)    FACE BILATERAL NUMBNESS    Microbiology results: 1/20 MRSA PCR: negative 1/20 COVID: negative  Thank you for allowing pharmacy to be a part of this patient's care.  Gillermina Hu, PharmD, BCPS, [facility] Clinical Pharmacist 04/21/2019 7:24 PM

## 2019-04-21 NOTE — Interval H&P Note (Signed)
History and Physical Interval Note:  04/21/2019 1:02 PM  Robert [name]  has presented today for surgery, with the diagnosis of FRACTURE OF UNSPECIFIED PARTS OF LUMBOSACRAL SPINE.  The various methods of treatment have been discussed with the patient and family. After consideration of risks, benefits and other options for treatment, the patient has consented to  Procedure(s) with comments: LUMBAR 5 GILL, LUMBAR 5- SACRAL 1 POSTERIOR LUMBAR INTERBODY  FUSION (N/A) - LUMBAR 5 GILL, LUMBAR 5- SACRAL 1 POSTERIOR LUMBAR INTERBODY  FUSION as a surgical intervention.  The patient's history has been reviewed, patient examined, no change in status, stable for surgery.  I have reviewed the patient's chart and labs.  Questions were answered to the patient's satisfaction.     Peggyann Shoals

## 2019-04-21 NOTE — Progress Notes (Signed)
Awake, alert, conversant. Full strength both legs, PF, DF, EHL bilaterally.  Some numbness in right foot.  Doing well.

## 2019-04-21 NOTE — Op Note (Addendum)
04/21/2019  5:15 PM  PATIENT:  Robert [name]  44 y.o. 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 S 1 with HNP, lumbago, radiculopathy  PROCEDURE:  Procedure(s) with comments: LUMBAR FIVE GILL, LUMBAR FIVE- SACRAL ONEPOSTERIOR LUMBAR INTERBODY  FUSION (N/A) - posterior with PEEK cages, pedicle screw fixation, posterolateral arthrodesis  SURGEON:  Surgeon(s) and Role:    Erline Levine, MD - Primary    * Kary Kos, MD - Assisting  PHYSICIAN ASSISTANT:   ASSISTANTS: Poteat, RN   ANESTHESIA:   general  EBL:  200 mL   BLOOD ADMINISTERED:none  DRAINS: none   LOCAL MEDICATIONS USED:  MARCAINE    and LIDOCAINE   SPECIMEN:  No Specimen  DISPOSITION OF SPECIMEN:  N/A  COUNTS:  YES  TOURNIQUET:  * No tourniquets in log *  DICTATION: Patient is 44 year old woman with mobile spondylolisthesis of L 5 on S 1 with lumbar disc herniation and radiculopathyy. He has a severe right  S 1 radiculopathy. It was elected to take him to surgery for decompression and fusion at this level.   Procedure: Patient was placed in a prone position on the [place] table after smooth and uncomplicated induction of general endotracheal anesthesia. His low back was prepped and draped in usual sterile fashion with betadine scrub and DuraPrep after marking anatomy with C arm. Area of incision was infiltrated with local lidocaine. Incision was made to the lumbodorsal fascia was incised and exposure was performed of the L 5 through S 1 spinous processes laminae facet joint and transverse processes. Intraoperative x-ray was obtained which confirmed correct orientation. A Gill procedure of L 5 was performed with disarticulation of the facet joints at this level and thorough decompression was performed of both L 5 and S 1 nerve roots along with the common dural tube.  This decompression was more involved than would be typical of that performed for PLIF alone and included painstaking dissection of adherent ligament compressing the thecal sac and wide decompression of all neural elements. A thorough discectomy was initially performed on the left with preparation of the endplates for grafting a trial spacer was placed this level and a thorough discectomy was performed on the right as well. A large disc herniation was carefully removed on the right with decompression of the S 1 nerve root. Bilateral median 8 x 9 x 23 mm x 12 degree mm peek cages were packed with autograft  and were inserted the interspace and countersunk appropriately along with 10 cc of morselized bone autograft. The posterolateral region was extensively decorticated and pedicle probes were placed at L 5 and S 1 bilaterally. Intraoperative fluoroscopy confirmed correct orientationin the AP and lateral plane. 50 x 7.5 mm pedicle screws were placed at L5 bilaterally and 45 x 7.5 mm screws placed at S 1 bilaterally final x-rays demonstrated well-positioned interbody grafts and pedicle screw fixation. A 40 mm lordotic rod was placed on the right and a 40 mm rod was placed on the left locked down in situ and the posterolateral region was packed with the remaining autograft (15 cc on each side). The wound was irrigated and long-acting Marcaine was injected in the deep musculature. Fascia was closed with 1 Vicryl sutures skin edges were reapproximated 2 and 3-0 Vicryl sutures. The wound is dressed with Dermabond and an occlusive dressing the patient was extubated in  the operating room and taken to recovery in stable satisfactory condition. He tolerated the operation well counts were correct at the end of the case.   PLAN OF CARE: Admit to inpatient   PATIENT DISPOSITION:  PACU - hemodynamically stable.   Delay start of Pharmacological VTE agent (>24hrs) due to surgical blood loss or risk of bleeding: yes

## 2019-04-21 NOTE — Anesthesia Procedure Notes (Signed)
Procedure Name: Intubation Date/Time: 04/21/2019 1:16 PM Performed by: Harden Mo, CRNA Pre-anesthesia Checklist: Patient identified, Emergency Drugs available, Suction available and Patient being monitored Patient Re-evaluated:Patient Re-evaluated prior to induction Oxygen Delivery Method: Circle System Utilized Preoxygenation: Pre-oxygenation with 100% oxygen Induction Type: IV induction and Rapid sequence Laryngoscope Size: Miller and 2 Grade View: Grade I Tube type: Oral Tube size: 7.5 mm Number of attempts: 1 Airway Equipment and Method: Stylet and Oral airway Placement Confirmation: ETT inserted through vocal cords under direct vision,  positive ETCO2 and breath sounds checked- equal and bilateral Secured at: 23 cm Tube secured with: Tape Dental Injury: Teeth and Oropharynx as per pre-operative assessment

## 2019-04-21 NOTE — Progress Notes (Signed)
Patient placed himself on home CPAP unit for the night.  

## 2019-04-21 NOTE — Transfer of Care (Signed)
Immediate Anesthesia Transfer of Care Note  Patient: Robert [name]  Procedure(s) Performed: LUMBAR FIVE GILL, LUMBAR FIVE- SACRAL ONEPOSTERIOR LUMBAR INTERBODY  FUSION (N/A Spine Lumbar)  Patient Location: PACU  Anesthesia Type:General  Level of Consciousness: awake, alert , oriented and patient cooperative  Airway & Oxygen Therapy: Patient Spontanous Breathing and Patient connected to nasal cannula oxygen  Post-op Assessment: Report given to RN and Post -op Vital signs reviewed and stable  Post vital signs: Reviewed and stable  Last Vitals:  Vitals Value Taken Time  BP 143/80 04/21/19 1705  Temp    Pulse 101 04/21/19 1710  Resp 18 04/21/19 1710  SpO2 91 % 04/21/19 1710  Vitals shown include unvalidated device data.  Last Pain:  Vitals:   04/21/19 1108  TempSrc:   PainSc: 0-No pain         Complications: No apparent anesthesia complications

## 2019-04-21 NOTE — Anesthesia Preprocedure Evaluation (Addendum)
Anesthesia Evaluation  Patient identified by MRN, date of birth, ID band Patient awake    Reviewed: Allergy & Precautions, NPO status , Patient's Chart, lab work & pertinent test results  Airway Mallampati: II  TM Distance: >3 FB Neck ROM: Full    Dental  (+) Teeth Intact, Dental Advisory Given   Pulmonary sleep apnea and Continuous Positive Airway Pressure Ventilation ,    Pulmonary exam normal        Cardiovascular hypertension, Normal cardiovascular exam     Neuro/Psych  Headaches, PSYCHIATRIC DISORDERS Anxiety Depression    GI/Hepatic negative GI ROS, Neg liver ROS,   Endo/Other  negative endocrine ROS  Renal/GU negative Renal ROS     Musculoskeletal negative musculoskeletal ROS (+)   Abdominal   Peds  Hematology negative hematology ROS (+)   Anesthesia Other Findings Day of surgery medications reviewed with the patient.  Reproductive/Obstetrics                           Anesthesia Physical Anesthesia Plan  ASA: II  Anesthesia Plan: General   Post-op Pain Management:    Induction: Intravenous  PONV Risk Score and Plan: 3 and Ondansetron, Dexamethasone and Midazolam  Airway Management Planned: Oral ETT  Additional Equipment:   Intra-op Plan:   Post-operative Plan: Extubation in OR  Informed Consent: I have reviewed the patients History and Physical, chart, labs and discussed the procedure including the risks, benefits and alternatives for the proposed anesthesia with the patient or authorized representative who has indicated his/her understanding and acceptance.     Dental advisory given  Plan Discussed with: CRNA and Anesthesiologist  Anesthesia Plan Comments:        Anesthesia Quick Evaluation

## 2019-04-22 MED ORDER — OXYCODONE-ACETAMINOPHEN 5-325 MG PO TABS: 1.0000 | ORAL_TABLET | ORAL | 0 refills | Status: DC | PRN

## 2019-04-22 MED ORDER — METHOCARBAMOL 500 MG PO TABS: 500.0000 mg | ORAL_TABLET | Freq: Four times a day (QID) | ORAL | 0 refills | Status: DC

## 2019-04-22 NOTE — Evaluation (Signed)
Physical Therapy Evaluation Patient Details Name: Robert [name] MRN: [id number] DOB: 1976-01-16 Today's Date: 04/22/2019   History of Present Illness  Pt is a 44 y/o male s/p L5-S1 PLIF. PMH including but not limited to ADHD and hypothyroidism.  Clinical Impression  Pt presented seated upright at EOB, awake and willing to participate in therapy session. Prior to admission, pt reported that he was independent with all functional mobility and ADLs. Pt lives with his wife in a two level home with a couple of steps to enter. At the time of evaluation, pt overall moving very well at a supervision level. He participated in stair training without difficulties. PT provided pt education re: back precautions, appropriate use/wear of lumbar corset, car transfers and a generalized walking program for pt to initiate upon d/c home. All questions were answered at end of session. No further acute PT needs identified at this time. PT signing off.     Follow Up Recommendations No PT follow up    Equipment Recommendations  None recommended by PT    Recommendations for Other Services       Precautions / Restrictions Precautions Precautions: Back Precaution Comments: reviewed 3/3 back precautions with pt throughout Required Braces or Orthoses: Spinal Brace Spinal Brace: Lumbar corset;Applied in sitting position Restrictions Weight Bearing Restrictions: No      Mobility  Bed Mobility               General bed mobility comments: pt sitting EOB upon arrival  Transfers Overall transfer level: Needs assistance Equipment used: None Transfers: Sit to/from Stand Sit to Stand: Supervision         General transfer comment: steady with transition  Ambulation/Gait Ambulation/Gait assistance: Supervision Gait Distance (Feet): 500 Feet Assistive device: None Gait Pattern/deviations: Step-through pattern;Decreased stride length Gait velocity: decreased   General Gait Details: pt overall  steady with ambulation in hallway without use of an AD, no LOB or need for physical assistance  Stairs Stairs: Yes Stairs assistance: Supervision Stair Management: One rail Right;Alternating pattern;Forwards Number of Stairs: 10 General stair comments: pt performed without difficulties  Wheelchair Mobility    Modified Rankin (Stroke Patients Only)       Balance Overall balance assessment: No apparent balance deficits (not formally assessed)                                           Pertinent Vitals/Pain Pain Assessment: Faces Faces Pain Scale: Hurts a little bit Pain Location: lower back, incision site Pain Descriptors / Indicators: Sore Pain Intervention(s): Monitored during session;Repositioned    Home Living Family/patient expects to be discharged to:: Private residence Living Arrangements: Spouse/significant other;Children Available Help at Discharge: Family;Available PRN/intermittently Type of Home: House Home Access: Stairs to enter Entrance Stairs-Rails: None Entrance Stairs-Number of Steps: 2 Home Layout: Laundry or work area in basement;Able to live on main level with bedroom/bathroom Home Equipment: None      Prior Function Level of Independence: Independent         Comments: works in [employer], can work from home     Journalist, newspaper        Extremity/Trunk Assessment   Upper Extremity Assessment Upper Extremity Assessment: Overall WFL for tasks assessed    Lower Extremity Assessment Lower Extremity Assessment: Overall WFL for tasks assessed    Cervical / Trunk Assessment Cervical / Trunk Assessment: Other exceptions Cervical /  Trunk Exceptions: s/p lumbar sx  Communication   Communication: No difficulties  Cognition Arousal/Alertness: Awake/alert Behavior During Therapy: WFL for tasks assessed/performed Overall Cognitive Status: Within Functional Limits for tasks assessed                                         General Comments      Exercises     Assessment/Plan    PT Assessment Patent does not need any further PT services  PT Problem List         PT Treatment Interventions      PT Goals (Current goals can be found in the Care Plan section)  Acute Rehab PT Goals Patient Stated Goal: "home today" PT Goal Formulation: All assessment and education complete, DC therapy    Frequency     Barriers to discharge        Co-evaluation               AM-PAC PT "6 Clicks" Mobility  Outcome Measure Help needed turning from your back to your side while in a flat bed without using bedrails?: None Help needed moving from lying on your back to sitting on the side of a flat bed without using bedrails?: None Help needed moving to and from a bed to a chair (including a wheelchair)?: None Help needed standing up from a chair using your arms (e.g., wheelchair or bedside chair)?: None Help needed to walk in hospital room?: None Help needed climbing 3-5 steps with a railing? : None 6 Click Score: 24    End of Session Equipment Utilized During Treatment: Back brace Activity Tolerance: Patient tolerated treatment well Patient left: in bed;with call bell/phone within reach Nurse Communication: Mobility status PT Visit Diagnosis: Other abnormalities of gait and mobility (R26.89)    [id number] PT Time Calculation (min) (ACUTE ONLY): 18 min   Charges:   PT Evaluation $PT Eval Low Complexity: 1 Low          Eduard Clos, PT, DPT  Acute Rehabilitation Services Pager [phone] Office [place] 04/22/2019, 9:29 AM

## 2019-04-22 NOTE — Discharge Summary (Signed)
Physician Discharge Summary  Patient ID: Robert [name] MRN: [id number] DOB/AGE: 1975/12/20 44 y.o.  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 The patient tolerated the procedure well and was taken to the recovery room and then to the floor in stable condition. The hospital course was routine. There were no complications. The wound remained clean dry and intact. Pt had appropriate back soreness. No complaints of leg pain or new N/T/W. The patient remained afebrile with stable vital signs, and tolerated a regular diet. The patient continued to increase activities, and pain was well controlled with oral pain medications.   Consults: None  Significant Diagnostic Studies:  Results for orders placed or performed during the hospital encounter of 04/21/19  Basic metabolic panel  Result Value Ref Range   Sodium 141 135 - 145 mmol/L   Potassium 4.6 3.5 - 5.1 mmol/L   Chloride 108 98 - 111 mmol/L   CO2 25 22 - 32 mmol/L   Glucose, Bld 94 70 - 99 mg/dL   BUN 18 6 - 20 mg/dL   Creatinine, Ser 2.72 0.61 - 1.24 mg/dL   Calcium 8.8 (L) 8.9 - 10.3 mg/dL   GFR calc non Af Amer >60 >60 mL/min   GFR calc Af Amer >60 >60 mL/min   Anion gap 8 5 - 15    DG Lumbar Spine 2-3 Views  Result Date: 04/21/2019 CLINICAL DATA:  Intraoperative evaluation of the lower lumbar spine. EXAM: LUMBAR SPINE - 2-3 VIEW COMPARISON:  None. FINDINGS: Bilateral pedicle screws are seen at the levels of L1-5 and S1. Radiopaque operative material is seen within the intervertebral disc space at the level of L5-S1. Alignment is normal. IMPRESSION: Bilateral pedicle screws at L1-5 and S1. Electronically Signed   By: Aram Candela  M.D.   On: 04/21/2019 17:08   DG C-Arm 1-60 Min  Result Date: 04/21/2019 CLINICAL DATA:  Lumbar interbody fusion for fracture. EXAM: DG C-ARM 1-60 MIN CONTRAST:  None. FLUOROSCOPY TIME:  Fluoroscopy Time:  28 seconds Radiation Exposure Index (if provided by the fluoroscopic device): N/A Number of Acquired Spot Images: 2 COMPARISON:  None. FINDINGS: Bilateral pedicle screws are seen at the levels of L5 and S1. Radiopaque operative material is seen within the intervertebral disc space at the level of L5-S1. There is normal alignment. IMPRESSION: Postsurgical changes at L5-S1. Electronically Signed   By: Aram Candela M.D.   On: 04/21/2019 17:07    Antibiotics:  Anti-infectives (From admission, onward)   Start     Dose/Rate Route Frequency Ordered Stop   04/21/19 2330  vancomycin (VANCOREADY) IVPB 1500 mg/300 mL     1,500 mg 150 mL/hr over 120 Minutes Intravenous  Once 04/21/19 1929 04/22/19 0827   04/21/19 0600  vancomycin (VANCOREADY) IVPB 1500 mg/300 mL     1,500 mg 150 mL/hr over 120 Minutes Intravenous On call to O.R. 04/20/19 0739 04/21/19 1900      Discharge Exam: Blood pressure 140/90, pulse 88, temperature 98.4 F (36.9 C), temperature source Oral, resp. rate 18, height 6\' 3"  (1.905 m), weight (!) 138.7 kg, SpO2 95 %. Neurologic: Grossly normal ambualting and voiding well, incision cdi  Discharge Medications:   Allergies as of 04/22/2019  Reactions   Penicillins Anaphylaxis   Milk-related Compounds Other (See Comments)   GI issues    Morphine And Related Itching   Full body itching   Topamax Other (See Comments)   FACE BILATERAL NUMBNESS      Medication List    STOP taking these medications   HYDROcodone-acetaminophen 5-325 MG tablet Commonly known as: NORCO/VICODIN     TAKE these medications   amphetamine-dextroamphetamine 30 MG 24 hr capsule Commonly known as: ADDERALL XR Take 30 mg by mouth daily.   Armour Thyroid 120 MG tablet Generic drug:  thyroid Take 120 mg by mouth daily before breakfast.   cyclobenzaprine 10 MG tablet Commonly known as: FLEXERIL Take 1 tablet (10 mg total) by mouth 3 (three) times daily as needed for muscle spasms.   Diclofenac Sodium 2 % Soln Commonly known as: Pennsaid Place 2 g onto the skin 2 (two) times daily.   gabapentin 300 MG capsule Commonly known as: NEURONTIN One tab PO qHS for a week, then BID for a week, then TID. May double weekly to a max of 3,600mg /day What changed:   how much to take  how to take this  when to take this  additional instructions   LORazepam 0.5 MG tablet Commonly known as: ATIVAN Take 1 tablet (0.5 mg total) by mouth 2 (two) times daily as needed for anxiety.   meloxicam 15 MG tablet Commonly known as: MOBIC Take 1 tablet (15 mg total) by mouth daily.   methocarbamol 500 MG tablet Commonly known as: Robaxin Take 1 tablet (500 mg total) by mouth 4 (four) times daily.   oxyCODONE-acetaminophen 5-325 MG tablet Commonly known as: Percocet Take 1 tablet by mouth every 4 (four) hours as needed for severe pain.   SUMAtriptan 50 MG tablet Commonly known as: IMITREX Take 1 tablet (50 mg total) by mouth every 2 (two) hours as needed for migraine. May repeat in 2 hours if headache persists or recurs.   tadalafil 20 MG tablet Commonly known as: CIALIS Take 0.5-1 tablets (10-20 mg total) by mouth every other day as needed for erectile dysfunction.   tamsulosin 0.4 MG Caps capsule Commonly known as: FLOMAX Take 0.4 mg by mouth daily.   vitamin C 1000 MG tablet Take 1,000 mg by mouth daily.   VITAMIN D3 PO Take 1 capsule by mouth daily.   VITAMIN K2 PO Take 1 capsule by mouth daily.       Disposition: home   Final Dx: PLIF L5-S1  Discharge Instructions     Remove dressing in 72 hours   Complete by: As directed    Call MD for:  difficulty breathing, headache or visual disturbances   Complete by: As directed    Call MD for:  hives   Complete  by: As directed    Call MD for:  persistant nausea and vomiting   Complete by: As directed    Call MD for:  redness, tenderness, or signs of infection (pain, swelling, redness, odor or green/yellow discharge around incision site)   Complete by: As directed    Call MD for:  severe uncontrolled pain   Complete by: As directed    Call MD for:  temperature >100.4   Complete by: As directed    Diet - low sodium heart healthy   Complete by: As directed    Driving Restrictions   Complete by: As directed    No driving for 2 weeks, no riding in the car for 1 week  Increase activity slowly   Complete by: As directed    Lifting restrictions   Complete by: As directed    No lifting more than 8 lbs         Signed: Tiana Loft Colandra Ohanian 04/22/2019, 8:44 AM

## 2019-04-22 NOTE — Discharge Instructions (Signed)

## 2019-04-22 NOTE — Evaluation (Signed)
Occupational Therapy Evaluation Patient Details Name: Robert [name] MRN: [id number] DOB: 1975/07/12 Today's Date: 04/22/2019    History of Present Illness Pt is a 44 y/o male s/p L5-S1 PLIF. PMH including but not limited to ADHD and hypothyroidism.   Clinical Impression   Patient is s/p see above surgery resulting in the deficits listed below (see OT Problem List). Patient is going to have family support when going home as needed. Patient was able to complete UE/LE dressing with increase time. Patient able to recall 3/3 precautions and don/doff brace. Patient educated on AE on how to follow precautions when home.  Patient will benefit from skilled OT to increase their safety and independence with ADL and functional mobility for ADL (while adhering to their precautions) to facilitate discharge to venue listed below.      Follow Up Recommendations  No OT follow up;Supervision - Intermittent    Equipment Recommendations       Recommendations for Other Services       Precautions / Restrictions Precautions Precautions: Back Precaution Booklet Issued: Yes (comment) Precaution Comments: reviewed 3/3 back precautions with pt throughout Required Braces or Orthoses: Spinal Brace Spinal Brace: Lumbar corset;Applied in sitting position Restrictions Weight Bearing Restrictions: No      Mobility Bed Mobility Overal bed mobility: Needs Assistance Bed Mobility: Supine to Sit     Supine to sit: Supervision     General bed mobility comments: pt sitting EOB upon arrival  Transfers Overall transfer level: Needs assistance Equipment used: None Transfers: Sit to/from Stand Sit to Stand: Supervision         General transfer comment: steady with transition    Balance Overall balance assessment: No apparent balance deficits (not formally assessed)                                         ADL either performed or assessed with clinical judgement   ADL Overall  ADL's : Needs assistance/impaired Eating/Feeding: Independent;Sitting   Grooming: Wash/dry hands;Wash/dry face;Modified independent   Upper Body Bathing: Independent;Standing;Sitting   Lower Body Bathing: Supervison/ safety;Sit to/from stand;Cueing for sequencing   Upper Body Dressing : Modified independent;Sitting   Lower Body Dressing: Supervision/safety;Cueing for safety   Toilet Transfer: Modified Independent;Comfort height toilet   Toileting- Clothing Manipulation and Hygiene: Modified independent;Sit to/from stand   Tub/ Shower Transfer: Min guard;Adhering to back precautions   Functional mobility during ADLs: Modified independent       Vision Baseline Vision/History: No visual deficits Patient Visual Report: No change from baseline Vision Assessment?: No apparent visual deficits     Perception Perception Perception Tested?: No   Praxis Praxis Praxis tested?: Not tested    Pertinent Vitals/Pain Pain Assessment: 0-10 Pain Score: 3  Faces Pain Scale: Hurts a little bit Pain Location: lower back, incision site Pain Descriptors / Indicators: Sore Pain Intervention(s): Limited activity within patient's tolerance;Monitored during session     Hand Dominance     Extremity/Trunk Assessment Upper Extremity Assessment Upper Extremity Assessment: Overall WFL for tasks assessed   Lower Extremity Assessment Lower Extremity Assessment: Defer to PT evaluation   Cervical / Trunk Assessment Cervical / Trunk Assessment: Other exceptions Cervical / Trunk Exceptions: s/p lumbar sx   Communication Communication Communication: No difficulties   Cognition Arousal/Alertness: Awake/alert Behavior During Therapy: WFL for tasks assessed/performed Overall Cognitive Status: Within Functional Limits for tasks assessed  General Comments       Exercises     Shoulder Instructions      Home Living Family/patient expects to  be discharged to:: Private residence Living Arrangements: Spouse/significant other;Children Available Help at Discharge: Family;Available PRN/intermittently Type of Home: House Home Access: Stairs to enter [employer] of Steps: 2 Entrance Stairs-Rails: None Home Layout: Laundry or work area in basement;Able to live on main level with bedroom/bathroom     Bathroom Shower/Tub: Occupational psychologist: Handicapped height Bathroom Accessibility: Yes How Accessible: Accessible via walker Home Equipment: None          Prior Functioning/Environment Level of Independence: Independent        Comments: works in [employer], can work from home        OT Problem List: Decreased strength;Decreased activity tolerance;Decreased range of motion;Impaired balance (sitting and/or standing);Decreased safety awareness;Decreased knowledge of use of DME or AE;Decreased knowledge of precautions;Cardiopulmonary status limiting activity;Pain      OT Treatment/Interventions:      OT Goals(Current goals can be found in the care plan section) Acute Rehab OT Goals Patient Stated Goal: "home today" OT Goal Formulation: With patient Time For Goal Achievement: 04/22/19 Potential to Achieve Goals: Good  OT Frequency:     Barriers to D/C:            Co-evaluation              AM-PAC OT "6 Clicks" Daily Activity     Outcome Measure Help from another person eating meals?: None Help from another person taking care of personal grooming?: None Help from another person toileting, which includes using toliet, bedpan, or urinal?: None Help from another person bathing (including washing, rinsing, drying)?: A Little Help from another person to put on and taking off regular upper body clothing?: None Help from another person to put on and taking off regular lower body clothing?: None 6 Click Score: 23   End of Session Equipment Utilized During Treatment: Gait belt;Back  brace  Activity Tolerance: Patient tolerated treatment well Patient left: in bed;with call bell/phone within reach  OT Visit Diagnosis: Unsteadiness on feet (R26.81);Other abnormalities of gait and mobility (R26.89);Pain Pain - part of body: (back)                Time: [id number] OT Time Calculation (min): 26 min Charges:  OT General Charges $OT Visit: 1 Visit OT Evaluation $OT Eval Low Complexity: 1 Low OT Treatments $Self Care/Home Management : 8-22 mins  Joeseph Amor OTR/L  Acute Rehab Services  [phone] office number [phone] pager number   Joeseph Amor 04/22/2019, 10:12 AM

## 2019-04-22 NOTE — Progress Notes (Signed)
Patient is discharged from room 3C03 at this time. Alert and in stable condition. IV site d/c'd and instructions read to patient with understanding verbalized. Left unit via wheelchair with all belongings at side. 

## 2019-04-27 ENCOUNTER — Ambulatory Visit: Payer: Self-pay | Admitting: Psychology

## 2019-05-03 ENCOUNTER — Ambulatory Visit (INDEPENDENT_AMBULATORY_CARE_PROVIDER_SITE_OTHER): Payer: Commercial Managed Care - PPO | Admitting: Psychology

## 2019-05-03 DIAGNOSIS — F4322 Adjustment disorder with anxiety: Secondary | ICD-10-CM | POA: Diagnosis not present

## 2019-05-04 ENCOUNTER — Ambulatory Visit: Payer: Self-pay | Admitting: Psychology

## 2019-05-09 ENCOUNTER — Ambulatory Visit (INDEPENDENT_AMBULATORY_CARE_PROVIDER_SITE_OTHER): Payer: Commercial Managed Care - PPO | Admitting: Psychology

## 2019-05-09 DIAGNOSIS — F4322 Adjustment disorder with anxiety: Secondary | ICD-10-CM | POA: Diagnosis not present

## 2019-05-19 ENCOUNTER — Encounter: Payer: Self-pay | Admitting: Family Medicine

## 2019-05-19 ENCOUNTER — Other Ambulatory Visit: Payer: Self-pay

## 2019-05-19 ENCOUNTER — Ambulatory Visit (INDEPENDENT_AMBULATORY_CARE_PROVIDER_SITE_OTHER): Payer: Commercial Managed Care - PPO | Admitting: Family Medicine

## 2019-05-19 VITALS — Temp 98.5°F | Ht 75.0 in | Wt 291.5 lb

## 2019-05-19 DIAGNOSIS — R0981 Nasal congestion: Secondary | ICD-10-CM | POA: Diagnosis not present

## 2019-05-19 DIAGNOSIS — R6883 Chills (without fever): Secondary | ICD-10-CM | POA: Diagnosis not present

## 2019-05-19 DIAGNOSIS — B9689 Other specified bacterial agents as the cause of diseases classified elsewhere: Secondary | ICD-10-CM

## 2019-05-19 DIAGNOSIS — J329 Chronic sinusitis, unspecified: Secondary | ICD-10-CM

## 2019-05-19 MED ORDER — AZITHROMYCIN 250 MG PO TABS: ORAL_TABLET | ORAL | 0 refills | Status: DC

## 2019-05-19 NOTE — Progress Notes (Signed)
Phone [phone] Virtual visit via Video note   Subjective:  Chief complaint: Chief Complaint  Patient presents with  . virtual  . cold, chills and sore throat   This visit type was conducted due to national recommendations for restrictions regarding the COVID-19 Pandemic (e.g. social distancing).  This format is felt to be most appropriate for this patient at this time balancing risks to patient and risks to population by having him in for in person visit.  No physical exam was performed (except for noted visual exam or audio findings with Telehealth visits).    Our team/I connected with [name] at  1:00 PM EST by a video enabled telemedicine application (doxy.me or caregility through epic) and verified that I am speaking with the correct person using two identifiers.  Location patient: [facility] Location provider: [facility], office Persons participating in the virtual visit:  patient  Our team/I discussed the limitations of evaluation and management by telemedicine and the availability of in person appointments. In light of current covid-19 pandemic, patient also understands that we are trying to protect them by minimizing in office contact if at all possible.  The patient expressed consent for telemedicine visit and agreed to proceed. Patient understands insurance will be billed.   Past Medical History-  Patient Active Problem List   Diagnosis Date Noted  . 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 Depression, major 09/27/2014    Priority: Medium  . 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 Anxiety state 01/11/2014    Priority: Medium  . Obesity (BMI 35.0-39.9 without comorbidity) 01/11/2014    Priority: Medium  . ADHD (attention deficit hyperactivity disorder) 06/02/2011    Priority: Medium  . Wart 02/27/2015    Priority: Low  . Volar plate injury of finger 01/09/2015    Priority: Low  . Rash 09/27/2014    Priority: Low  .  Erectile dysfunction 09/27/2014    Priority: Low  . Vitamin D deficiency 07/23/2014    Priority: Low  . S/P partial thyroidectomy 02/28/2014    Priority: Low  . Cyst of mandible 12/22/2013    Priority: Low  . 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 Cervical radiculopathy at C6 08/01/2018  . Radial nerve entrapment, right 07/04/2018  . Hyperlipidemia, unspecified 10/22/2017  . Lumbar back pain with radiculopathy affecting right lower extremity 08/29/2016    Medications- reviewed and updated Current Outpatient Medications  Medication Sig Dispense Refill  . amphetamine-dextroamphetamine (ADDERALL XR) 30 MG 24 hr capsule Take 30 mg by mouth daily.     Francia Greaves THYROID 120 MG tablet Take 120 mg by mouth daily before breakfast.     . Ascorbic Acid (VITAMIN C) 1000 MG tablet Take 1,000 mg by mouth daily.    . Cholecalciferol (VITAMIN D3 PO) Take 1 capsule by mouth daily.    . cyclobenzaprine (FLEXERIL) 10 MG tablet Take 1 tablet (10 mg total) by mouth 3 (three) times daily as needed for muscle spasms. 30 tablet 1  . gabapentin (NEURONTIN) 300 MG capsule One tab PO qHS for a week, then BID for a week, then TID. May double weekly to a max of 3,600mg /day (Patient taking differently: Take 900 mg by mouth 3 (three) times daily. ) 180 capsule 3  . LORazepam (ATIVAN) 0.5 MG tablet Take 1 tablet (0.5 mg total) by mouth 2 (two) times daily as needed for anxiety. 60 tablet 0  . meloxicam (MOBIC) 15 MG tablet Take 1 tablet (15  mg total) by mouth daily. 30 tablet 3  . Menaquinone-7 (VITAMIN K2 PO) Take 1 capsule by mouth daily.    . methocarbamol (ROBAXIN) 500 MG tablet Take 1 tablet (500 mg total) by mouth 4 (four) times daily. 40 tablet 0  . oxyCODONE-acetaminophen (PERCOCET) 5-325 MG tablet Take 1 tablet by mouth every 4 (four) hours as needed for severe pain. 20 tablet 0  . SUMAtriptan (IMITREX) 50 MG tablet Take 1 tablet (50 mg total) by mouth every 2 (two) hours as needed for migraine. May repeat  in 2 hours if headache persists or recurs. 90 tablet 3  . tadalafil (CIALIS) 20 MG tablet Take 0.5-1 tablets (10-20 mg total) by mouth every other day as needed for erectile dysfunction. 5 tablet 2  . tamsulosin (FLOMAX) 0.4 MG CAPS capsule Take 0.4 mg by mouth daily.    Marland Kitchen azithromycin (ZITHROMAX) 250 MG tablet Take 2 tabs on day 1, then 1 tab daily until finished 6 tablet 0  . Diclofenac Sodium (PENNSAID) 2 % SOLN Place 2 g onto the skin 2 (two) times daily. (Patient not taking: Reported on 02/16/2019) 112 g 3   No current facility-administered medications for this visit.     Objective:  Temp 98.5 F (36.9 C)   Ht 6\' 3"  (1.905 m)   Wt 291 lb 8 oz (132.2 kg)   BMI 36.44 kg/m  self reported vitals Gen: NAD, resting comfortably Lungs: nonlabored, normal respiratory rate  Skin: appears dry, no obvious rash     Assessment and Plan   Cold/chills/sore throat S: Pt c/o fever, chills, cough congestion and sore throat that started a week ago with first day of symptoms around the 10th.   Nasal congestion with some pressure with yellow and green mucous. Pt has been taking mucinex, vitamin C and Xicam. He reports a fever on Saturday night of 100.8. Daughter has double ear infection and bronchiolitis. Pt has diminished taste of smell  (not worse than normal cold) and his COVID [facility] rapid test was negaive on 05/14/19. Very slight improvement as of today .  Had just had lumbar spine surgery with Dr. Vertell Limber on 04/21/2019 so chills were really distressing.  A/P: Patient with 10 days of sinus congestion/discharge/pressure-concerning for bacterial sinusitis.  He has penicillin allergy with anaphylaxis so cannot use penicillin or cephalosporin.  We opted to treat with azithromycin which would also cover for strep throat if present given sore throat.  We will also need to retest for COVID-19 with nonrapid test as below.  If COVID-19 test is negative and he fails to improve on azithromycin would likely use  doxycycline as next up.  Patient with symptoms concerning for potential covid 19 Therefore: -information provided on testing scheduling "please text "COVID" to [phone], OR you can log on to [email or web address] to easily make an on-line appointment. "  - recommended patient watch closely for shortness of breath or confusion or worsening symptoms and if those occur he should contact us immediately  -recommended patient consider purchasing pulse oximeter and if levels 94% or below persistently- seek care at the hospital  -recommended self isolation until negative non rapid test  at minimum . Also discussed potential for false negatives and to still be cautious even with negative test  - for quarantine if covid 19 test positive would need to be at least 10 days since first symptom AND at least 24 hours fever free without fever reducing medications AND improvement in respiratory symptoms  - we also discussed close  contacts would need 14 day quarantine after last close contact with patient IF patient is positive  -Hopeful results back within 48 hours of test   Recommended follow up:  Future Appointments  Date Time Provider [place]  05/23/2019  4:00 PM Dolores Lory [facility] None   Lab/Order associations:   ICD-10-CM   1. Bacterial sinusitis  J32.9    B96.89   2. Nasal congestion  R09.81   3. Chills  R68.83     Meds ordered this encounter  Medications  . azithromycin (ZITHROMAX) 250 MG tablet    Sig: Take 2 tabs on day 1, then 1 tab daily until finished    Dispense:  6 tablet    Refill:  0    Return precautions advised.  Garret Reddish, MD

## 2019-05-19 NOTE — Progress Notes (Signed)
Duplicate note

## 2019-05-19 NOTE — Patient Instructions (Signed)
There are no preventive care reminders to display for this patient. Depression screen [facility] 2/9 03/23/2019 03/03/2019 10/28/2018  Decreased Interest 0 0 1  Down, Depressed, Hopeless 0 0 1  PHQ - 2 Score 0 0 2  Altered sleeping 1 0 0  Tired, decreased energy 0 0 1  Change in appetite 0 0 3  Feeling bad or failure about yourself  0 0 0  Trouble concentrating 0 1 3  Moving slowly or fidgety/restless 0 0 0  Suicidal thoughts 0 0 0  PHQ-9 Score 1 1 9   Difficult doing work/chores Not difficult at all Not difficult at all Somewhat difficult

## 2019-05-21 ENCOUNTER — Encounter: Payer: Self-pay | Admitting: Family Medicine

## 2019-05-22 MED ORDER — BUDESONIDE 90 MCG/ACT IN AEPB: 2.0000 | INHALATION_SPRAY | Freq: Two times a day (BID) | RESPIRATORY_TRACT | 0 refills | Status: DC

## 2019-05-23 ENCOUNTER — Emergency Department (HOSPITAL_BASED_OUTPATIENT_CLINIC_OR_DEPARTMENT_OTHER)
Admission: EM | Admit: 2019-05-23 | Discharge: 2019-05-23 | Disposition: A | Discharge status: Home / Self Care | Payer: Commercial Managed Care - PPO | Attending: Emergency Medicine | Admitting: Emergency Medicine

## 2019-05-23 ENCOUNTER — Encounter (HOSPITAL_BASED_OUTPATIENT_CLINIC_OR_DEPARTMENT_OTHER): Payer: Self-pay | Admitting: *Deleted

## 2019-05-23 ENCOUNTER — Encounter: Payer: Self-pay | Admitting: Family Medicine

## 2019-05-23 ENCOUNTER — Emergency Department (HOSPITAL_BASED_OUTPATIENT_CLINIC_OR_DEPARTMENT_OTHER): Payer: Commercial Managed Care - PPO

## 2019-05-23 ENCOUNTER — Ambulatory Visit (INDEPENDENT_AMBULATORY_CARE_PROVIDER_SITE_OTHER): Payer: Commercial Managed Care - PPO | Admitting: Family Medicine

## 2019-05-23 ENCOUNTER — Ambulatory Visit (INDEPENDENT_AMBULATORY_CARE_PROVIDER_SITE_OTHER): Payer: Commercial Managed Care - PPO | Admitting: Psychology

## 2019-05-23 ENCOUNTER — Other Ambulatory Visit: Payer: Self-pay

## 2019-05-23 VITALS — HR 125 | Ht 75.0 in | Wt 291.5 lb

## 2019-05-23 DIAGNOSIS — R0902 Hypoxemia: Secondary | ICD-10-CM

## 2019-05-23 DIAGNOSIS — R0602 Shortness of breath: Secondary | ICD-10-CM | POA: Diagnosis present

## 2019-05-23 DIAGNOSIS — I10 Essential (primary) hypertension: Secondary | ICD-10-CM | POA: Diagnosis not present

## 2019-05-23 DIAGNOSIS — F4322 Adjustment disorder with anxiety: Secondary | ICD-10-CM

## 2019-05-23 DIAGNOSIS — E039 Hypothyroidism, unspecified: Secondary | ICD-10-CM | POA: Diagnosis not present

## 2019-05-23 DIAGNOSIS — U071 COVID-19: Secondary | ICD-10-CM | POA: Insufficient documentation

## 2019-05-23 DIAGNOSIS — Z79899 Other long term (current) drug therapy: Secondary | ICD-10-CM | POA: Diagnosis not present

## 2019-05-23 NOTE — ED Notes (Signed)
Presents with respiratory complaints of feeling short of breath when ambulating and some activity. States this past Sunday was informed had Positive Covid19 testing.

## 2019-05-23 NOTE — ED Provider Notes (Signed)
[facility] EMERGENCY DEPARTMENT Provider Note   CSN: [id number] Arrival date & time: 05/23/19  1737     History Chief Complaint  Patient presents with  . Shortness of Breath    Robert [name] is a 45 y.o. male.  Patient with a positive Covid test on Sunday.  However became symptomatic about 2 weeks ago.  Today was having follow-up with primary care doctor through telemedicine because he was feeling some increased shortness of breath and his pulse ox that he has at home was showing that it was dropping down into the 70s with ambulation.  So he was referred in for further evaluation.  Patient was walked back to the room briskly here by the nurse oxygen saturations have remained to 100% even with exertion.  Not tachycardic not febrile blood pressure slightly elevated at 150/100.  Patient states that he is noticed some increased shortness of breath the past few days.  But denies any chest pain denies any leg swelling.  Patient recently did have some mild back surgery.  So he does wear a brace.  But no significant change in his back.        Past Medical History:  Diagnosis Date  . ADD (attention deficit disorder) without hyperactivity    according to his mother/ work test possible  . Anxiety   . Chronic sinusitis   . Depression   . Headache   . History of kidney stones    passed stone  . Hyperlipidemia    no meds, diet controlled  . Hypertension    recently dx 2 months ago, MD just monitoring, no meds at this time  . Hypothyroidism   . Sleep apnea    Uses CPAP nightly.    Patient Active Problem List   Diagnosis Date Noted  . 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 Cervical radiculopathy at C6 08/01/2018  . Radial nerve entrapment, right 07/04/2018  . Hyperlipidemia, unspecified 10/22/2017  . Secondary polycythemia 09/09/2017  . Lumbar back pain with radiculopathy affecting right lower extremity 08/29/2016  . Wart 02/27/2015  . Volar plate injury of finger  01/09/2015  . Depression, major 09/27/2014  . Rash 09/27/2014  . Erectile dysfunction 09/27/2014  . Vitamin D deficiency 07/23/2014  . 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 S/P partial thyroidectomy 02/28/2014  . Anxiety state 01/11/2014  . Obesity (BMI 35.0-39.9 without comorbidity) 01/11/2014  . Cyst of mandible 12/22/2013  . ADHD (attention deficit hyperactivity disorder) 06/02/2011    Past Surgical History:  Procedure Laterality Date  . HERNIA REPAIR     pt. was an infant when this surgery occured  . NASAL SINUS SURGERY    . THYROID SURGERY Right 02/28/2014   DR TEOH    PARTIAL THYROIDECTOMY  . THYROIDECTOMY Right 02/28/2014   Procedure: RIGHT HEMI THYROIDECTOMY;  Surgeon: Ascencion Dike, MD;  Location: [facility];  Service: ENT;  Laterality: Right;  . WISDOM TOOTH EXTRACTION         Family History  Problem Relation Age of Onset  . Hyperlipidemia Mother   . Hypertension Mother   . Thyroid disease Neg Hx     Social History   Tobacco Use  . Smoking status: Never Smoker  . Smokeless tobacco: Never Used  Substance Use Topics  . Alcohol use: Yes    Alcohol/week: 1.0 - 5.0 standard drinks    Types: 1 - 5 Cans of beer per week  . Drug use: No    Home Medications Prior to Admission medications  Medication Sig Start Date End Date Taking? Authorizing Provider  amphetamine-dextroamphetamine (ADDERALL XR) 30 MG 24 hr capsule Take 30 mg by mouth daily.  08/30/17   [provider]  ARMOUR THYROID 120 MG tablet Take 120 mg by mouth daily before breakfast.  12/26/17   [provider]  Ascorbic Acid (VITAMIN C) 1000 MG tablet Take 1,000 mg by mouth daily.    [provider]  azithromycin (ZITHROMAX) 250 MG tablet Take 2 tabs on day 1, then 1 tab daily until finished 05/19/19   Marin Olp, MD  Budesonide 90 MCG/ACT inhaler Inhale 2 puffs into the lungs 2 (two) times daily. 05/22/19   Marin Olp, MD  Cholecalciferol (VITAMIN D3 PO) Take 1 capsule  by mouth daily.    [provider]  cyclobenzaprine (FLEXERIL) 10 MG tablet Take 1 tablet (10 mg total) by mouth 3 (three) times daily as needed for muscle spasms. 06/24/18   Hassell Done, Mary-Margaret, FNP  Diclofenac Sodium (PENNSAID) 2 % SOLN Place 2 g onto the skin 2 (two) times daily. 07/04/18   Lyndal Pulley, DO  gabapentin (NEURONTIN) 300 MG capsule One tab PO qHS for a week, then BID for a week, then TID. May double weekly to a max of 3,600mg /day Patient taking differently: Take 900 mg by mouth 3 (three) times daily.  02/16/19   Gregor Hams, MD  LORazepam (ATIVAN) 0.5 MG tablet Take 1 tablet (0.5 mg total) by mouth 2 (two) times daily as needed for anxiety. 01/20/19   Marin Olp, MD  meloxicam (MOBIC) 15 MG tablet Take 1 tablet (15 mg total) by mouth daily. 06/24/18   Hassell Done, Mary-Margaret, FNP  Menaquinone-7 (VITAMIN K2 PO) Take 1 capsule by mouth daily.    [provider]  oxyCODONE-acetaminophen (PERCOCET) 5-325 MG tablet Take 1 tablet by mouth every 4 (four) hours as needed for severe pain. 04/22/19 04/21/20  Meyran, Ocie Cornfield, NP  SUMAtriptan (IMITREX) 50 MG tablet Take 1 tablet (50 mg total) by mouth every 2 (two) hours as needed for migraine. May repeat in 2 hours if headache persists or recurs. 09/09/17   Marin Olp, MD  tadalafil (CIALIS) 20 MG tablet Take 0.5-1 tablets (10-20 mg total) by mouth every other day as needed for erectile dysfunction. 10/28/18   Inda Coke, PA  tamsulosin (FLOMAX) 0.4 MG CAPS capsule Take 0.4 mg by mouth daily.    [provider]    Allergies    Penicillins, Milk-related compounds, Morphine and related, and Topamax  Review of Systems   Review of Systems  Constitutional: Negative for chills and fever.  HENT: Negative for congestion, rhinorrhea and sore throat.   Eyes: Negative for visual disturbance.  Respiratory: Positive for shortness of breath. Negative for cough.   Cardiovascular: Negative for chest  pain and leg swelling.  Gastrointestinal: Negative for abdominal pain, diarrhea, nausea and vomiting.  Genitourinary: Negative for dysuria.  Musculoskeletal: Negative for back pain and neck pain.  Skin: Negative for rash.  Neurological: Negative for dizziness, light-headedness and headaches.  Hematological: Does not bruise/bleed easily.  Psychiatric/Behavioral: Negative for confusion.    Physical Exam Updated Vital Signs BP (!) 150/100   Pulse (!) 53   Temp 98.5 F (36.9 C) (Oral)   Resp 16   Ht 1.905 m (6\' 3" )   Wt 132 kg   SpO2 100%   BMI 36.37 kg/m   Physical Exam Vitals and nursing note reviewed.  Constitutional:      General:  He is not in acute distress.    Appearance: Normal appearance. He is well-developed. He is not ill-appearing or toxic-appearing.  HENT:     Head: Normocephalic and atraumatic.  Eyes:     Extraocular Movements: Extraocular movements intact.     Conjunctiva/sclera: Conjunctivae normal.     Pupils: Pupils are equal, round, and reactive to light.  Cardiovascular:     Rate and Rhythm: Normal rate and regular rhythm.     Heart sounds: No murmur.  Pulmonary:     Effort: Pulmonary effort is normal. No respiratory distress.     Breath sounds: Normal breath sounds.  Abdominal:     Palpations: Abdomen is soft.     Tenderness: There is no abdominal tenderness.  Musculoskeletal:        General: No swelling. Normal range of motion.     Cervical back: Normal range of motion and neck supple.  Skin:    General: Skin is warm and dry.     Capillary Refill: Capillary refill takes less than 2 seconds.  Neurological:     General: No focal deficit present.     Mental Status: He is alert and oriented to person, place, and time.     ED Results / Procedures / Treatments   Labs (all labs ordered are listed, but only abnormal results are displayed) Labs Reviewed - No data to display  EKG None  Radiology DG Chest [facility] 1 View  Result Date:  05/23/2019 CLINICAL DATA:  COVID shortness of breath EXAM: PORTABLE CHEST 1 VIEW COMPARISON:  May 19, 2016 FINDINGS: The heart size and mediastinal contours are within normal limits. Both lungs are clear. The visualized skeletal structures are unremarkable. IMPRESSION: No active disease. Electronically Signed   By: Prudencio Pair M.D.   On: 05/23/2019 18:44    Procedures Procedures (including critical care time)  Medications Ordered in ED Medications - No data to display  ED Course  I have reviewed the triage vital signs and the nursing notes.  Pertinent labs & imaging results that were available during my care of the patient were reviewed by me and considered in my medical decision making (see chart for details).    MDM Rules/Calculators/A&P                       Patient very well in appearance.  Not toxic at all.  Oxygen saturations 100% not tachypneic.  No difficulty with speaking.  Oxygen sat remained 100% even with brisk walking.  Chest x-ray negative for pneumonia or any pneumothorax.  Patient stable for discharge home.  Close follow-up with primary care doctor and returning for any new or worse symptoms.  Clinically not concerned about pulmonary embolus not tachycardic here.  Oxygen saturations very high.  Feel that the little bit of shortness of breath that he is experiencing subjectively probably secondary to the COVID-19 infection.   Final Clinical Impression(s) / ED Diagnoses Final diagnoses:  COVID-19 virus infection    Rx / DC Orders ED Discharge Orders    None       Fredia Sorrow, MD 05/23/19 1921

## 2019-05-23 NOTE — Discharge Instructions (Signed)
Work-up here no evidence of any hypoxia.  Even with brisk walking.  Chest x-ray negative for any signs of pneumonia.  Continue self-isolation at home.  Monitor your pulse ox at rest.  If it is dropping below 94% at rest and contact your primary care doctor or get seen again.  But today's evaluation without any significant findings.

## 2019-05-23 NOTE — ED Triage Notes (Addendum)
Pt c/o SOB x 2 days ago , dx 2/21, amb to room 100 %  Pt states 02 sat at home 90%

## 2019-05-23 NOTE — Patient Instructions (Signed)
There are no preventive care reminders to display for this patient. Depression screen [facility] 2/9 05/19/2019 03/23/2019 03/03/2019  Decreased Interest 0 0 0  Down, Depressed, Hopeless 0 0 0  PHQ - 2 Score 0 0 0  Altered sleeping 0 1 0  Tired, decreased energy 1 0 0  Change in appetite 0 0 0  Feeling bad or failure about yourself  0 0 0  Trouble concentrating 0 0 1  Moving slowly or fidgety/restless 0 0 0  Suicidal thoughts 0 0 0  PHQ-9 Score 1 1 1   Difficult doing work/chores Not difficult at all Not difficult at all Not difficult at all

## 2019-05-23 NOTE — Progress Notes (Signed)
Phone [phone] Virtual visit via Video note   Subjective:  Chief complaint: Chief Complaint  Patient presents with  . virtual  . positive COVID    This visit type was conducted due to national recommendations for restrictions regarding the COVID-19 Pandemic (e.g. social distancing).  This format is felt to be most appropriate for this patient at this time balancing risks to patient and risks to population by having him in for in person visit.  No physical exam was performed (except for noted visual exam or audio findings with Telehealth visits).    Our team/I connected with Robert [name] at  3:40 PM EST by a video enabled telemedicine application ([email or web address] or caregility through epic) and verified that I am speaking with the correct person using two identifiers.  Location patient: [place] Location provider: [facility], office Persons participating in the virtual visit:  patient  Our team/I discussed the limitations of evaluation and management by telemedicine and the availability of in person appointments. In light of current covid-19 pandemic, patient also understands that we are trying to protect them by minimizing in office contact if at all possible.  The patient expressed consent for telemedicine visit and agreed to proceed. Patient understands insurance will be billed.   Past Medical History-  Patient Active Problem List   Diagnosis Date Noted  . 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 Depression, major 09/27/2014    Priority: Medium  . 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 Anxiety state 01/11/2014    Priority: Medium  . Obesity (BMI 35.0-39.9 without comorbidity) 01/11/2014    Priority: Medium  . ADHD (attention deficit hyperactivity disorder) 06/02/2011    Priority: Medium  . Wart 02/27/2015    Priority: Low  . Volar plate injury of finger 01/09/2015    Priority: Low  . Rash 09/27/2014    Priority: Low  . Erectile  dysfunction 09/27/2014    Priority: Low  . Vitamin D deficiency 07/23/2014    Priority: Low  . S/P partial thyroidectomy 02/28/2014    Priority: Low  . Cyst of mandible 12/22/2013    Priority: Low  . 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 Cervical radiculopathy at C6 08/01/2018  . Radial nerve entrapment, right 07/04/2018  . Hyperlipidemia, unspecified 10/22/2017  . Lumbar back pain with radiculopathy affecting right lower extremity 08/29/2016    Medications- reviewed and updated Current Outpatient Medications  Medication Sig Dispense Refill  . amphetamine-dextroamphetamine (ADDERALL XR) 30 MG 24 hr capsule Take 30 mg by mouth daily.     Francia Greaves THYROID 120 MG tablet Take 120 mg by mouth daily before breakfast.     . Ascorbic Acid (VITAMIN C) 1000 MG tablet Take 1,000 mg by mouth daily.    Marland Kitchen azithromycin (ZITHROMAX) 250 MG tablet Take 2 tabs on day 1, then 1 tab daily until finished 6 tablet 0  . Budesonide 90 MCG/ACT inhaler Inhale 2 puffs into the lungs 2 (two) times daily. 1 each 0  . Cholecalciferol (VITAMIN D3 PO) Take 1 capsule by mouth daily.    . cyclobenzaprine (FLEXERIL) 10 MG tablet Take 1 tablet (10 mg total) by mouth 3 (three) times daily as needed for muscle spasms. 30 tablet 1  . Diclofenac Sodium (PENNSAID) 2 % SOLN Place 2 g onto the skin 2 (two) times daily. 112 g 3  . gabapentin (NEURONTIN) 300 MG capsule One tab PO qHS for a week, then BID for a week, then TID. May  double weekly to a max of 3,600mg /day (Patient taking differently: Take 900 mg by mouth 3 (three) times daily. ) 180 capsule 3  . LORazepam (ATIVAN) 0.5 MG tablet Take 1 tablet (0.5 mg total) by mouth 2 (two) times daily as needed for anxiety. 60 tablet 0  . meloxicam (MOBIC) 15 MG tablet Take 1 tablet (15 mg total) by mouth daily. 30 tablet 3  . Menaquinone-7 (VITAMIN K2 PO) Take 1 capsule by mouth daily.    Marland Kitchen oxyCODONE-acetaminophen (PERCOCET) 5-325 MG tablet Take 1 tablet by mouth every 4 (four)  hours as needed for severe pain. 20 tablet 0  . SUMAtriptan (IMITREX) 50 MG tablet Take 1 tablet (50 mg total) by mouth every 2 (two) hours as needed for migraine. May repeat in 2 hours if headache persists or recurs. 90 tablet 3  . tadalafil (CIALIS) 20 MG tablet Take 0.5-1 tablets (10-20 mg total) by mouth every other day as needed for erectile dysfunction. 5 tablet 2  . tamsulosin (FLOMAX) 0.4 MG CAPS capsule Take 0.4 mg by mouth daily.     No current facility-administered medications for this visit.     Objective:  Pulse (!) 125   Ht 6\' 3"  (1.905 m)   Wt 291 lb 8 oz (132.2 kg)   SpO2 90% Comment: with ambulation  BMI 36.44 kg/m  self reported vitals Gen: NAD, resting comfortably Lungs: nonlabored at rest, normal respiratory rate .  With minimal exertion patient perspired and appeared short of breath Skin: appears dry, no obvious rash     Assessment and Plan  Positive Covid 19 testing and concern for hypoxia S: Patient tested positive on rapid COVID-19 test since last visit.  Overall he feels pretty good at rest.  His nasal congestion has significantly improved with antibiotic therapy.  He was not previously short of breath at rest or with activity but reports he has started to have shortness of breath with activity.  He bought a pulse oximeter and at rest he is generally around 95 to 97% but when he exerts himself notes oxygen levels down into even the 70s or 80s.  I had him ambulate while we were talking and his oxygen level dropped down to 90% and stayed there for over 2 minutes and appeared he had a good waveform.  His heart rate even several minutes after resting stayed above 100 on the phone call today.  Last night we had discussed by MyChart Pulmicort likely being low risk and based on some preliminary studies may have potential benefit-he did take 1 dose of that this morning but did not note any substantial difference in symptoms  He is concerned he may have contracted this  from his daughters daycare. Only place he has gone in a month was [place] drive through and grocery store other than picking up his daughter. Daughters daycare shut down for 2 weeks as there were a few cases of COVID-19. Daughter has been sick with double infection and bronchiolitis but pediatrician will not test as states not necessary-that was prior to him testing positive though A/P: Patient positive for COVID-19 and appears to have hypoxia with exertion on his home pulse oximeter with sats down into the 70s or 80s per his report (got to 90% with short walk on our exam)-I believe he needs to be evaluated in the emergency room to evaluate true extent of hypoxia.  We also discussed possible diagnoses like pulmonary embolism which can happen with COVID-19 and emergency department provider would weigh  in.  I did call the emergency room physician on call and gave a heads up on the case-asked whether urgent care would be more appropriate but he did recommend emergency room visit.  Lab/Order associations:   ICD-10-CM   1. COVID-19  U07.1    Time Spent: 35 minutes of total time (4:04 PM- 4:39 PM) was spent on the date of the encounter performing the following actions: chart review prior to seeing the patient, obtaining history, performing a medically necessary exam, counseling on the treatment plan, placing orders, and documenting in our EHR.   Return precautions advised.  Garret Reddish, MD

## 2019-05-31 ENCOUNTER — Other Ambulatory Visit: Payer: Self-pay | Admitting: Nurse Practitioner

## 2019-06-01 ENCOUNTER — Observation Stay (HOSPITAL_COMMUNITY)
Admission: EM | Admit: 2019-06-01 | Discharge: 2019-06-03 | Disposition: A | Discharge status: Home / Self Care | Payer: Commercial Managed Care - PPO | Attending: Internal Medicine | Admitting: Internal Medicine

## 2019-06-01 ENCOUNTER — Other Ambulatory Visit: Payer: Self-pay

## 2019-06-01 ENCOUNTER — Encounter: Payer: Self-pay | Admitting: Family Medicine

## 2019-06-01 ENCOUNTER — Encounter (HOSPITAL_COMMUNITY): Payer: Self-pay

## 2019-06-01 ENCOUNTER — Emergency Department (HOSPITAL_COMMUNITY): Payer: Commercial Managed Care - PPO

## 2019-06-01 DIAGNOSIS — Z7951 Long term (current) use of inhaled steroids: Secondary | ICD-10-CM | POA: Diagnosis not present

## 2019-06-01 DIAGNOSIS — E039 Hypothyroidism, unspecified: Secondary | ICD-10-CM | POA: Insufficient documentation

## 2019-06-01 DIAGNOSIS — I10 Essential (primary) hypertension: Secondary | ICD-10-CM | POA: Insufficient documentation

## 2019-06-01 DIAGNOSIS — Z79890 Hormone replacement therapy (postmenopausal): Secondary | ICD-10-CM | POA: Diagnosis not present

## 2019-06-01 DIAGNOSIS — Z8616 Personal history of covid-19: Secondary | ICD-10-CM | POA: Insufficient documentation

## 2019-06-01 DIAGNOSIS — Z791 Long term (current) use of non-steroidal anti-inflammatories (NSAID): Secondary | ICD-10-CM | POA: Insufficient documentation

## 2019-06-01 DIAGNOSIS — E669 Obesity, unspecified: Secondary | ICD-10-CM | POA: Insufficient documentation

## 2019-06-01 DIAGNOSIS — M545 Low back pain, unspecified: Secondary | ICD-10-CM

## 2019-06-01 DIAGNOSIS — Z981 Arthrodesis status: Secondary | ICD-10-CM | POA: Insufficient documentation

## 2019-06-01 DIAGNOSIS — M549 Dorsalgia, unspecified: Secondary | ICD-10-CM

## 2019-06-01 DIAGNOSIS — Z20822 Contact with and (suspected) exposure to covid-19: Secondary | ICD-10-CM | POA: Insufficient documentation

## 2019-06-01 DIAGNOSIS — Z79899 Other long term (current) drug therapy: Secondary | ICD-10-CM | POA: Diagnosis not present

## 2019-06-01 DIAGNOSIS — Z6834 Body mass index (BMI) 34.0-34.9, adult: Secondary | ICD-10-CM | POA: Diagnosis not present

## 2019-06-01 DIAGNOSIS — G4730 Sleep apnea, unspecified: Secondary | ICD-10-CM | POA: Diagnosis not present

## 2019-06-01 DIAGNOSIS — F909 Attention-deficit hyperactivity disorder, unspecified type: Secondary | ICD-10-CM | POA: Diagnosis not present

## 2019-06-01 DIAGNOSIS — M4306 Spondylolysis, lumbar region: Secondary | ICD-10-CM

## 2019-06-01 DIAGNOSIS — M5416 Radiculopathy, lumbar region: Secondary | ICD-10-CM

## 2019-06-01 DIAGNOSIS — F419 Anxiety disorder, unspecified: Secondary | ICD-10-CM | POA: Insufficient documentation

## 2019-06-01 MED ORDER — DIAZEPAM 2 MG PO TABS: 2.0000 mg | ORAL_TABLET | ORAL | 0 refills | Status: DC | PRN

## 2019-06-01 MED ORDER — HYDROMORPHONE HCL 1 MG/ML IJ SOLN
1.0000 mg | Freq: Once | INTRAMUSCULAR | Status: AC
  Administered 2019-06-01: 1 mg via INTRAVENOUS
  Filled 2019-06-01: qty 1

## 2019-06-01 MED ORDER — LORAZEPAM 2 MG/ML IJ SOLN
1.0000 mg | Freq: Once | INTRAMUSCULAR | Status: AC
  Administered 2019-06-01: 1 mg via INTRAVENOUS
  Filled 2019-06-01: qty 1

## 2019-06-01 MED ORDER — DIAZEPAM 5 MG PO TABS
5.0000 mg | ORAL_TABLET | Freq: Once | ORAL | Status: AC
  Administered 2019-06-01: 5 mg via ORAL
  Filled 2019-06-01: qty 1

## 2019-06-01 MED ORDER — HYDROMORPHONE HCL 1 MG/ML IJ SOLN
0.5000 mg | Freq: Once | INTRAMUSCULAR | Status: AC
  Administered 2019-06-01: 0.5 mg via INTRAVENOUS
  Filled 2019-06-01: qty 1

## 2019-06-01 NOTE — ED Triage Notes (Signed)
Pain 3 if he doesn't move 10 if he does

## 2019-06-01 NOTE — ED Provider Notes (Signed)
[facility] EMERGENCY DEPARTMENT Provider Note   CSN: [id number] Arrival date & time: 06/01/19  1813     History Chief Complaint  Patient presents with  . Back Pain    Robert [name] is a 44 y.o. male.  HPI Patient presents to the emergency department with increasing back pain over the last week.  The patient states he had surgery on January 22 for an L4-5 fracture spinal fusion performed he also had a microdiscectomy.  Patient states that he was laying down tonight when he tried to get up and had increasing pain.  The patient states when he moves the pain increases significantly.  Patient states he is not had any incontinence, numbness, fever, dizziness, headache, neck pain, headache, chest pain, shortness of breath or syncope.    Past Medical History:  Diagnosis Date  . ADD (attention deficit disorder) without hyperactivity    according to his mother/ work test possible  . Anxiety   . Chronic sinusitis   . Depression   . Headache   . History of kidney stones    passed stone  . Hyperlipidemia    no meds, diet controlled  . Hypertension    recently dx 2 months ago, MD just monitoring, no meds at this time  . Hypothyroidism   . Sleep apnea    Uses CPAP nightly.    Patient Active Problem List   Diagnosis Date Noted  . 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 Cervical radiculopathy at C6 08/01/2018  . Radial nerve entrapment, right 07/04/2018  . Hyperlipidemia, unspecified 10/22/2017  . Secondary polycythemia 09/09/2017  . Lumbar back pain with radiculopathy affecting right lower extremity 08/29/2016  . Wart 02/27/2015  . Volar plate injury of finger 01/09/2015  . Depression, major 09/27/2014  . Rash 09/27/2014  . Erectile dysfunction 09/27/2014  . Vitamin D deficiency 07/23/2014  . 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 S/P partial thyroidectomy 02/28/2014  . Anxiety state 01/11/2014  . Obesity (BMI 35.0-39.9 without comorbidity)  01/11/2014  . Cyst of mandible 12/22/2013  . ADHD (attention deficit hyperactivity disorder) 06/02/2011    Past Surgical History:  Procedure Laterality Date  . HERNIA REPAIR     pt. was an infant when this surgery occured  . NASAL SINUS SURGERY    . THYROID SURGERY Right 02/28/2014   DR TEOH    PARTIAL THYROIDECTOMY  . THYROIDECTOMY Right 02/28/2014   Procedure: RIGHT HEMI THYROIDECTOMY;  Surgeon: Ascencion Dike, MD;  Location: [facility];  Service: ENT;  Laterality: Right;  . WISDOM TOOTH EXTRACTION         Family History  Problem Relation Age of Onset  . Hyperlipidemia Mother   . Hypertension Mother   . Thyroid disease Neg Hx     Social History   Tobacco Use  . Smoking status: Never Smoker  . Smokeless tobacco: Never Used  Substance Use Topics  . Alcohol use: Yes    Alcohol/week: 1.0 - 5.0 standard drinks    Types: 1 - 5 Cans of beer per week  . Drug use: No    Home Medications Prior to Admission medications   Medication Sig Start Date End Date Taking? Authorizing Provider  amphetamine-dextroamphetamine (ADDERALL XR) 30 MG 24 hr capsule Take 30 mg by mouth daily.  08/30/17   [provider]  ARMOUR THYROID 120 MG tablet Take 120 mg by mouth daily before breakfast.  12/26/17   [provider]  Ascorbic Acid (VITAMIN C) 1000 MG tablet Take  1,000 mg by mouth daily.    [provider]  azithromycin (ZITHROMAX) 250 MG tablet Take 2 tabs on day 1, then 1 tab daily until finished 05/19/19   Marin Olp, MD  Budesonide 90 MCG/ACT inhaler Inhale 2 puffs into the lungs 2 (two) times daily. 05/22/19   Marin Olp, MD  Cholecalciferol (VITAMIN D3 PO) Take 1 capsule by mouth daily.    [provider]  cyclobenzaprine (FLEXERIL) 10 MG tablet Take 1 tablet (10 mg total) by mouth 3 (three) times daily as needed for muscle spasms. 06/24/18   Hassell Done, Mary-Margaret, FNP  Diclofenac Sodium (PENNSAID) 2 % SOLN Place 2 g onto the skin 2 (two) times daily.  07/04/18   Lyndal Pulley, DO  gabapentin (NEURONTIN) 300 MG capsule One tab PO qHS for a week, then BID for a week, then TID. May double weekly to a max of 3,600mg /day Patient taking differently: Take 900 mg by mouth 3 (three) times daily.  02/16/19   Gregor Hams, MD  LORazepam (ATIVAN) 0.5 MG tablet Take 1 tablet (0.5 mg total) by mouth 2 (two) times daily as needed for anxiety. 01/20/19   Marin Olp, MD  meloxicam (MOBIC) 15 MG tablet Take 1 tablet (15 mg total) by mouth daily. 06/24/18   Hassell Done, Mary-Margaret, FNP  Menaquinone-7 (VITAMIN K2 PO) Take 1 capsule by mouth daily.    [provider]  oxyCODONE-acetaminophen (PERCOCET) 5-325 MG tablet Take 1 tablet by mouth every 4 (four) hours as needed for severe pain. 04/22/19 04/21/20  Meyran, Ocie Cornfield, NP  SUMAtriptan (IMITREX) 50 MG tablet Take 1 tablet (50 mg total) by mouth every 2 (two) hours as needed for migraine. May repeat in 2 hours if headache persists or recurs. 09/09/17   Marin Olp, MD  tadalafil (CIALIS) 20 MG tablet Take 0.5-1 tablets (10-20 mg total) by mouth every other day as needed for erectile dysfunction. 10/28/18   Inda Coke, PA  tamsulosin (FLOMAX) 0.4 MG CAPS capsule Take 0.4 mg by mouth daily.    [provider]    Allergies    Penicillins, Milk-related compounds, Morphine and related, and Topamax  Review of Systems   Review of Systems All other systems negative except as documented in the HPI. All pertinent positives and negatives as reviewed in the HPI. Physical Exam Updated Vital Signs BP (!) 150/93 (BP Location: Right Arm)   Pulse 90   Temp 98.2 F (36.8 C) (Oral)   Resp 19   Ht 6\' 3"  (1.905 m)   Wt 130.6 kg   SpO2 99%   BMI 36.00 kg/m   Physical Exam Vitals and nursing note reviewed.  Constitutional:      General: He is not in acute distress.    Appearance: He is well-developed.  HENT:     Head: Normocephalic and atraumatic.  Eyes:     Pupils: Pupils are  equal, round, and reactive to light.  Pulmonary:     Effort: Pulmonary effort is normal.  Skin:    General: Skin is warm and dry.  Neurological:     Mental Status: He is alert and oriented to person, place, and time.     Sensory: No sensory deficit.     Motor: No weakness.     Coordination: Coordination normal.     Deep Tendon Reflexes: Reflexes normal.     Comments: Patient has normal strength and sensation in the lower extremities but feels weak and movement.  ED Results / Procedures / Treatments   Labs (all labs ordered are listed, but only abnormal results are displayed) Labs Reviewed - No data to display  EKG None  Radiology No results found.  Procedures Procedures (including critical care time)  Medications Ordered in ED Medications  HYDROmorphone (DILAUDID) injection 1 mg (1 mg Intravenous Given 06/01/19 1932)    ED Course  I have reviewed the triage vital signs and the nursing notes.  Pertinent labs & imaging results that were available during my care of the patient were reviewed by me and considered in my medical decision making (see chart for details).    MDM Rules/Calculators/A&P                      Patient will need MRI to further assess and evaluate his sudden worsening symptoms.  Patient is given pain control.  Told plan and all questions were answered thus far. Final Clinical Impression(s) / ED Diagnoses Final diagnoses:  None    Rx / DC Orders ED Discharge Orders    None       Rebeca Allegra 06/01/19 2057    Lacretia Leigh, MD 06/02/19 2242

## 2019-06-01 NOTE — Discharge Instructions (Addendum)
Follow-up with your neurosurgeon if you have any continuing pain.

## 2019-06-01 NOTE — ED Provider Notes (Signed)
Medical screening examination/treatment/procedure(s) were conducted as a shared visit with non-physician practitioner(s) and myself.  I personally evaluated the patient during the encounter.    44 year old male who presented with acute onset of back pain while receiving massages.  MRI of back was reviewed with radiology secretary as results did not crossover and per those results did not show any evidence of infection.  Only postoperative changes.  Patient's pain treated here and he feels better.  Neurologically intact at time of discharge.  Instructed to follow with his doctor.   Lacretia Leigh, MD 06/01/19 [phone]

## 2019-06-01 NOTE — ED Triage Notes (Signed)
Pt had back surgery on 1/22, L4-L5 fracture. Pt got up to go to bathroom and had back pain. Was given 100 fentanyl and 4 Zofran en route, recent COVID infection says last test was negative.

## 2019-06-02 ENCOUNTER — Encounter (HOSPITAL_COMMUNITY): Payer: Self-pay | Admitting: Internal Medicine

## 2019-06-02 ENCOUNTER — Emergency Department (HOSPITAL_COMMUNITY): Payer: Commercial Managed Care - PPO

## 2019-06-02 DIAGNOSIS — F909 Attention-deficit hyperactivity disorder, unspecified type: Secondary | ICD-10-CM

## 2019-06-02 DIAGNOSIS — M545 Low back pain, unspecified: Secondary | ICD-10-CM | POA: Diagnosis present

## 2019-06-02 LAB — CBC WITH DIFFERENTIAL/PLATELET
Abs Immature Granulocytes: 0.05 10*3/uL (ref 0.00–0.07)
Basophils Absolute: 0.1 10*3/uL (ref 0.0–0.1)
Basophils Relative: 1 %
Eosinophils Absolute: 0.1 10*3/uL (ref 0.0–0.5)
Eosinophils Relative: 1 %
HCT: 46.8 % (ref 39.0–52.0)
Hemoglobin: 15.7 g/dL (ref 13.0–17.0)
Immature Granulocytes: 1 %
Lymphocytes Relative: 32 %
Lymphs Abs: 2.5 10*3/uL (ref 0.7–4.0)
MCH: 28.1 pg (ref 26.0–34.0)
MCHC: 33.5 g/dL (ref 30.0–36.0)
MCV: 83.7 fL (ref 80.0–100.0)
Monocytes Absolute: 1.1 10*3/uL — ABNORMAL HIGH (ref 0.1–1.0)
Monocytes Relative: 14 %
Neutro Abs: 4 10*3/uL (ref 1.7–7.7)
Neutrophils Relative %: 51 %
Platelets: 366 10*3/uL (ref 150–400)
RBC: 5.59 MIL/uL (ref 4.22–5.81)
RDW: 12.5 % (ref 11.5–15.5)
WBC: 7.8 10*3/uL (ref 4.0–10.5)
nRBC: 0 % (ref 0.0–0.2)

## 2019-06-02 LAB — BASIC METABOLIC PANEL
Anion gap: 10 (ref 5–15)
BUN: 13 mg/dL (ref 6–20)
CO2: 25 mmol/L (ref 22–32)
Calcium: 9.4 mg/dL (ref 8.9–10.3)
Chloride: 104 mmol/L (ref 98–111)
Creatinine, Ser: 0.96 mg/dL (ref 0.61–1.24)
GFR calc Af Amer: >60 mL/min (ref >60)
GFR calc non Af Amer: >60 mL/min (ref >60)
Glucose, Bld: 91 mg/dL (ref 70–99)
Potassium: 4.2 mmol/L (ref 3.5–5.1)
Sodium: 139 mmol/L (ref 135–145)

## 2019-06-02 LAB — SARS CORONAVIRUS 2 (TAT 6-24 HRS): SARS Coronavirus 2: NEGATIVE

## 2019-06-02 LAB — C-REACTIVE PROTEIN: CRP: 1.5 mg/dL — ABNORMAL HIGH (ref <1.0)

## 2019-06-02 LAB — SEDIMENTATION RATE: Sed Rate: 5 mm/hr (ref 0–16)

## 2019-06-02 MED ORDER — HYDROMORPHONE HCL 1 MG/ML IJ SOLN
0.5000 mg | INTRAMUSCULAR | Status: DC | PRN
  Administered 2019-06-02: 0.5 mg via INTRAVENOUS
  Filled 2019-06-02: qty 1

## 2019-06-02 MED ORDER — THYROID 120 MG PO TABS
120.0000 mg | ORAL_TABLET | Freq: Every day | ORAL | Status: DC
  Administered 2019-06-03: 120 mg via ORAL
  Filled 2019-06-02: qty 1

## 2019-06-02 MED ORDER — ACETAMINOPHEN 325 MG PO TABS: 650.0000 mg | ORAL_TABLET | Freq: Four times a day (QID) | ORAL | Status: DC | PRN

## 2019-06-02 MED ORDER — HYDROMORPHONE HCL 1 MG/ML IJ SOLN: 1.0000 mg | INTRAMUSCULAR | Status: DC | PRN

## 2019-06-02 MED ORDER — CYCLOBENZAPRINE HCL 10 MG PO TABS
10.0000 mg | ORAL_TABLET | Freq: Three times a day (TID) | ORAL | Status: DC | PRN
  Administered 2019-06-02: 10 mg via ORAL
  Filled 2019-06-02: qty 1

## 2019-06-02 MED ORDER — ACETAMINOPHEN 650 MG RE SUPP: 650.0000 mg | Freq: Four times a day (QID) | RECTAL | Status: DC | PRN

## 2019-06-02 MED ORDER — ONDANSETRON HCL 4 MG PO TABS: 4.0000 mg | ORAL_TABLET | Freq: Four times a day (QID) | ORAL | Status: DC | PRN

## 2019-06-02 MED ORDER — HYDROMORPHONE HCL 1 MG/ML IJ SOLN
1.0000 mg | Freq: Once | INTRAMUSCULAR | Status: AC
  Administered 2019-06-02: 1 mg via INTRAVENOUS
  Filled 2019-06-02: qty 1

## 2019-06-02 MED ORDER — DEXAMETHASONE SODIUM PHOSPHATE 10 MG/ML IJ SOLN
10.0000 mg | Freq: Once | INTRAMUSCULAR | Status: AC
  Administered 2019-06-02: 10 mg via INTRAVENOUS
  Filled 2019-06-02: qty 1

## 2019-06-02 MED ORDER — DEXAMETHASONE SODIUM PHOSPHATE 10 MG/ML IJ SOLN
10.0000 mg | INTRAMUSCULAR | Status: DC
  Administered 2019-06-03: 10 mg via INTRAVENOUS
  Filled 2019-06-02: qty 1

## 2019-06-02 MED ORDER — ACETAMINOPHEN 500 MG PO TABS
1000.0000 mg | ORAL_TABLET | Freq: Three times a day (TID) | ORAL | Status: DC
  Administered 2019-06-02 – 2019-06-03 (×4): 1000 mg via ORAL
  Filled 2019-06-02 (×4): qty 2

## 2019-06-02 MED ORDER — AMPHETAMINE-DEXTROAMPHET ER 10 MG PO CP24: 30.0000 mg | ORAL_CAPSULE | Freq: Every day | ORAL | Status: DC

## 2019-06-02 MED ORDER — CYCLOBENZAPRINE HCL 10 MG PO TABS
10.0000 mg | ORAL_TABLET | Freq: Three times a day (TID) | ORAL | Status: DC
  Administered 2019-06-02 – 2019-06-03 (×3): 10 mg via ORAL
  Filled 2019-06-02 (×3): qty 1

## 2019-06-02 MED ORDER — LORAZEPAM 0.5 MG PO TABS: 0.5000 mg | ORAL_TABLET | Freq: Two times a day (BID) | ORAL | Status: DC | PRN

## 2019-06-02 MED ORDER — OXYCODONE HCL 5 MG PO TABS
5.0000 mg | ORAL_TABLET | ORAL | Status: DC | PRN
  Administered 2019-06-02: 5 mg via ORAL
  Administered 2019-06-03: 10 mg via ORAL
  Filled 2019-06-02: qty 2
  Filled 2019-06-02: qty 1

## 2019-06-02 MED ORDER — ONDANSETRON HCL 4 MG/2ML IJ SOLN: 4.0000 mg | Freq: Four times a day (QID) | INTRAMUSCULAR | Status: DC | PRN

## 2019-06-02 NOTE — Consult Note (Signed)
Reason for Consult:back pain Referring Physician: Rock [name] is an 44 y.o. male.  HPI: Robert [name] is a 44 y.o. male with history of ADHD hypothyroidism who had a lumbar fusion surgery and discharged on April 22, 2019 2 months ago was doing fine until last week when patient had a prolonged sitting on a chair following which patient started developing severe back pain which has been continuously worsening.  Denies any weakness of the extremities or any incontinence of the urine or bowel.  Given the pain patient presents to the ER.   In the ER patient is afebrile and MRI of the L-spine shows postoperative changes with disc protrusion not ruled out and CT renal study was unremarkable. The patient was doing well on his first postoperative visit in the office and was having some mild residual low back pain without significant leg pain.  Since that visit, he contracted COVID and then spent 7 hours playing Poker, seated in a folding plastic chair.  Since that time, he has had progressively worsening low back pain.   He had called our office earlier today and we called in a medrol dosepak, but the patient elected to come to the ER for increasingly severe low back pain. He did not start the dosepak yet (I had asked for him to confirm use of steroids with his recent positive COVID test with his PCP).  The patient is complaining mainly of low back pain with tightness and popping and occasional electrical feelings into his legs.  None of this leg pain is constant.  His right leg pain has improved since his surgery and has never recurred.    Past Medical History:  Diagnosis Date  . ADD (attention deficit disorder) without hyperactivity    according to his mother/ work test possible  . Anxiety   . Chronic sinusitis   . Depression   . Headache   . History of kidney stones    passed stone  . Hyperlipidemia    no meds, diet controlled  . Hypertension    recently dx 2 months ago, MD  just monitoring, no meds at this time  . Hypothyroidism   . Sleep apnea    Uses CPAP nightly.    Past Surgical History:  Procedure Laterality Date  . HERNIA REPAIR     pt. was an infant when this surgery occured  . NASAL SINUS SURGERY    . THYROID SURGERY Right 02/28/2014   Robert [name]    PARTIAL THYROIDECTOMY  . THYROIDECTOMY Right 02/28/2014   Procedure: RIGHT HEMI THYROIDECTOMY;  Surgeon: Robert Dike, MD;  Location: [facility];  Service: ENT;  Laterality: Right;  . WISDOM TOOTH EXTRACTION      Family History  Problem Relation Age of Onset  . Hyperlipidemia Mother   . Hypertension Mother   . Thyroid disease Neg Hx     Social History:  reports that he has never smoked. He has never used smokeless tobacco. He reports current alcohol use of about 1.0 - 5.0 standard drinks of alcohol per week. He reports that he does not use drugs.  Allergies:  Allergies  Allergen Reactions  . Penicillins Anaphylaxis  . Milk-Related Compounds Other (See Comments)    GI issues   . Morphine And Related Itching    Full body itching  . Topamax Other (See Comments)    FACE BILATERAL NUMBNESS    Medications: I have reviewed the patient's current medications.  Results for orders placed or  performed during the hospital encounter of 06/01/19 (from the past 48 hour(s))  CBC with Differential/Platelet     Status: Abnormal   Collection Time: 06/02/19  1:21 AM  Result Value Ref Range   WBC 7.8 4.0 - 10.5 K/uL   RBC 5.59 4.22 - 5.81 MIL/uL   Hemoglobin 15.7 13.0 - 17.0 g/dL   HCT 46.8 39.0 - 52.0 %   MCV 83.7 80.0 - 100.0 fL   MCH 28.1 26.0 - 34.0 pg   MCHC 33.5 30.0 - 36.0 g/dL   RDW 12.5 11.5 - 15.5 %   Platelets 366 150 - 400 K/uL   nRBC 0.0 0.0 - 0.2 %   Neutrophils Relative % 51 %   Neutro Abs 4.0 1.7 - 7.7 K/uL   Lymphocytes Relative 32 %   Lymphs Abs 2.5 0.7 - 4.0 K/uL   Monocytes Relative 14 %   Monocytes Absolute 1.1 (H) 0.1 - 1.0 K/uL   Eosinophils Relative 1 %   Eosinophils Absolute 0.1  0.0 - 0.5 K/uL   Basophils Relative 1 %   Basophils Absolute 0.1 0.0 - 0.1 K/uL   Immature Granulocytes 1 %   Abs Immature Granulocytes 0.05 0.00 - 0.07 K/uL    Comment: Performed at [facility], [address] [place], [place] [id number]  Basic metabolic panel     Status: None   Collection Time: 06/02/19  1:21 AM  Result Value Ref Range   Sodium 139 135 - 145 mmol/L   Potassium 4.2 3.5 - 5.1 mmol/L   Chloride 104 98 - 111 mmol/L   CO2 25 22 - 32 mmol/L   Glucose, Bld 91 70 - 99 mg/dL    Comment: Glucose reference range applies only to samples taken after fasting for at least 8 hours.   BUN 13 6 - 20 mg/dL   Creatinine, Ser 0.96 0.61 - 1.24 mg/dL   Calcium 9.4 8.9 - 10.3 mg/dL   GFR calc non Af Amer >60 >60 mL/min   GFR calc Af Amer >60 >60 mL/min   Anion gap 10 5 - 15    Comment: Performed at [facility] [address] [place], [place] [address]  Sedimentation rate     Status: None   Collection Time: 06/02/19  1:21 AM  Result Value Ref Range   Sed Rate 5 0 - 16 mm/hr    Comment: Performed at [facility] [address] [place], [place] [address]  C-reactive protein     Status: Abnormal   Collection Time: 06/02/19  1:21 AM  Result Value Ref Range   CRP 1.5 (H) <1.0 mg/dL    Comment: Performed at [facility] [address] [place], [place] [address]    MR Lumbar Spine Wo Contrast  Result Date: 06/01/2019 CLINICAL DATA:  Initial evaluation for low back pain with progressive neurologic deficit, history of prior surgery. EXAM: MRI LUMBAR SPINE WITHOUT CONTRAST TECHNIQUE: Multiplanar, multisequence MR imaging of the lumbar spine was performed. No intravenous contrast was administered. COMPARISON:  Prior radiograph from 04/21/2019 as well as previous MRI from 02/19/2019. FINDINGS: Segmentation: Standard. Lowest well-formed disc space labeled the L5-S1 level. Alignment: Chronic 3 mm anterolisthesis of L5 on S1, stable. Alignment otherwise normal with  preservation of the normal lumbar lordosis. Vertebrae: Susceptibility artifact from prior PLIF seen at the L5-S1 level. Vertebral body height maintained without evidence for acute or chronic fracture. Bone marrow signal intensity diffusely decreased on T1 weighted imaging,  nonspecific, but most commonly related to anemia, smoking, or obesity. No discrete or worrisome osseous lesions. No abnormal marrow edema. Conus medullaris and cauda equina: Conus extends to the T12-L1 level. Conus and cauda equina appear normal. Paraspinal and other soft tissues: Postoperative changes from recent PLIF seen within the lower posterior paraspinous musculature. Small T2 hyperintense collection measuring 1.4 x 1.1 cm seen along the lower midline incision (series 8, image 30). No significant surrounding inflammatory changes. Finding most likely reflects a benign postoperative seroma. Paraspinous soft tissues demonstrate no other acute finding. Visualized visceral structures are normal. Disc levels: T12-L1: Normal interspace. Mild facet hypertrophy. No canal or foraminal stenosis. L1-2: Normal interspace. Mild bilateral facet hypertrophy. No canal or neural foraminal stenosis. L2-3: Normal interspace. Mild to moderate facet hypertrophy. No canal or foraminal stenosis. L3-4: Shallow left foraminal to extraforaminal disc protrusion contacts the exiting left L3 nerve root as it courses of the left neural foramen (series 8, image 23). Mild facet hypertrophy. No canal stenosis. Mild bilateral L3 foraminal narrowing, slightly worse on the left. L4-5: No significant disc bulge. Small endplate Schmorl's node deformity noted at the superior endplate of L5. Moderate bilateral facet hypertrophy. No significant canal or lateral recess stenosis. Mild bilateral L4 foraminal stenosis. L5-S1: Postoperative changes from interval interbody and posterior fusion with wide posterior decompression. Extensive soft tissue density involving the epidural space  in a circumferential fashion likely reflects postoperative granulation tissue. Irregular cystic density at the level of the previously seen right subarticular disc protrusion likely reflects postoperative changes as well. There is a heterogeneous T2 density measuring 8 mm in diameter contacting the descending right S1 nerve root (series 8, image 35). While this finding could reflect postoperative change, a small residual and/or recurrent disc herniation could also have this appearance, not fully assessed on this noncontrast examination. IMPRESSION: 1. Postoperative changes from interval PLIF at L5-S1 with extensive postoperative changes throughout the epidural space. Irregular 8 mm T2 hyperintense density contacting the descending right S1 nerve root favored to reflect postoperative change as well, although a small residual or recurrent disc herniation not entirely excluded, incompletely assessed on this noncontrast examination. Follow-up with postcontrast imaging could be performed for complete characterization as clinically warranted. 2. Shallow left foraminal to extraforaminal disc protrusion at L3-4, contacting and potentially irritating the exiting left L3 nerve root. Electronically Signed   By: Jeannine Boga M.D.   On: 06/01/2019 23:11   CT Renal Stone Study  Result Date: 06/02/2019 CLINICAL DATA:  Back pain EXAM: CT ABDOMEN AND PELVIS WITHOUT CONTRAST TECHNIQUE: Multidetector CT imaging of the abdomen and pelvis was performed following the standard protocol without IV contrast. COMPARISON:  June 01, 2019 FINDINGS: Lower chest: The visualized heart size within normal limits. No pericardial fluid/thickening. No hiatal hernia. Mild basilar atelectasis is noted. Hepatobiliary: Although limited due to the lack of intravenous contrast, normal in appearance without gross focal abnormality. No evidence of calcified gallstones or biliary ductal dilatation. Pancreas:  Unremarkable.  No surrounding  inflammatory changes. Spleen: Normal in size. Although limited due to the lack of intravenous contrast, normal in appearance. Adrenals/Urinary Tract: Both adrenal glands appear normal. The kidneys and collecting system appear normal without evidence of urinary tract calculus or hydronephrosis. Bladder is unremarkable. Stomach/Bowel: The stomach, small bowel, and colon are normal in appearance. Scattered colonic diverticula are noted. No inflammatory changes or obstructive findings. appendix is normal. Vascular/Lymphatic: There are no enlarged abdominal or pelvic lymph nodes. No significant gross vascular findings are present. Reproductive: The  prostate is unremarkable. Other: No evidence of abdominal wall mass or hernia. Musculoskeletal: The patient is status post decompression 1 and fixation 2 at L5-S1 with interbody fusion. No periprosthetic lucency or fracture is identified. No definite osseous fusion seen within the interbody spacer at L5-S1. Postsurgical changes are seen along the posterior paraspinal soft tissues. IMPRESSION: No renal or collecting system calculi. Scattered colonic diverticula without diverticulitis. Status post decompression and fixation at L5-S1. No acute hardware complication. No definite interbody fusion seen Electronically Signed   By: Prudencio Pair M.D.   On: 06/02/2019 01:50    Review of Systems - Negative except per HPI    Blood pressure 125/83, pulse 72, temperature 98.2 F (36.8 C), temperature source Oral, resp. rate 19, height 6\' 3"  (1.905 m), weight 130.6 kg, SpO2 92 %. Physical Exam  Constitutional: He is oriented to person, place, and time. He appears well-developed and well-nourished.  HENT:  Head: Normocephalic and atraumatic.  Eyes: Pupils are equal, round, and reactive to light. EOM are normal.  GI: Soft.  Musculoskeletal:        General: No tenderness, deformity or edema. Normal range of motion.     Cervical back: Normal range of motion and neck supple.   Neurological: He is alert and oriented to person, place, and time. He has normal strength and normal reflexes. No cranial nerve deficit or sensory deficit. GCS eye subscore is 4. GCS verbal subscore is 5. GCS motor subscore is 6.  Incision over low back is healing well.  Legs have full power.  No radicular complaints at present.    Assessment/Plan: I have reviewed the patient's imaging and do not think there is any concern for recurrent or persistent disc herniation or surgical complication.  His exam is benign.  His incision is healing well.  I agree with the administration of steroids and think that the patient needs to scale back his activities and that when he is doing better, he will be able to go home.  No prolonged sitting.  I have reviewed situation with the patient in detail.    Peggyann Shoals, MD 06/02/2019, 9:11 AM

## 2019-06-02 NOTE — Evaluation (Signed)
Physical Therapy Evaluation Patient Details Name: Robert [name] MRN: [id number] DOB: 1975-09-28 Today's Date: 06/02/2019   History of Present Illness  Pt is a 44 y/o male admitted secondary to increased lower back pain, likely secondary to prolonged period of sitting per notes. Pt with recent back surgery. Pt also with recent COVID infection as well. PMH includes HTN and sleep apnea on CPAP.   Clinical Impression  Pt admitted secondary to problem above with deficits below. Pt requiring min guard A for mobility this session. Was very guarded, but reports pain was not as bad. Educated about back precautions and generalized walking program. Feel he would benefit from outpatient PT to address deficits. Will continue to follow acutely to maximize functional mobility independence and safety.     Follow Up Recommendations Outpatient PT    Equipment Recommendations  Rolling walker with 5" wheels    Recommendations for Other Services       Precautions / Restrictions Precautions Precautions: Back Precaution Booklet Issued: No Precaution Comments: Verbally reviewed back precautions.  Required Braces or Orthoses: Spinal Brace Spinal Brace: Lumbar corset;Applied in sitting position Restrictions Weight Bearing Restrictions: No      Mobility  Bed Mobility Overal bed mobility: Needs Assistance Bed Mobility: Rolling;Sidelying to Sit Rolling: Supervision Sidelying to sit: Min assist       General bed mobility comments: Min A for trunk elevation to transition to sitting. Increased time required.   Transfers Overall transfer level: Needs assistance Equipment used: 1 person hand held assist Transfers: Sit to/from Stand Sit to Stand: Min guard         General transfer comment: Min guard for safety.   Ambulation/Gait Ambulation/Gait assistance: Min guard Gait Distance (Feet): 50 Feet(X2) Assistive device: 1 person hand held assist Gait Pattern/deviations: Step-through  pattern;Decreased stride length Gait velocity: Decreased   General Gait Details: Very cautious, guarded gait. Reaching out for hand rail for support. Educated about walking program to perform at home.   Stairs            Wheelchair Mobility    Modified Rankin (Stroke Patients Only)       Balance Overall balance assessment: Needs assistance Sitting-balance support: No upper extremity supported;Feet supported Sitting balance-Leahy Scale: Good     Standing balance support: Bilateral upper extremity supported;Single extremity supported;During functional activity Standing balance-Leahy Scale: Fair Standing balance comment: Able to maintain static standing without UE support                              Pertinent Vitals/Pain Pain Assessment: Faces Faces Pain Scale: Hurts even more Pain Location: back Pain Descriptors / Indicators: Aching;Grimacing;Guarding Pain Intervention(s): Monitored during session;Limited activity within patient's tolerance;Repositioned    Home Living Family/patient expects to be discharged to:: Private residence Living Arrangements: Spouse/significant other;Children Available Help at Discharge: Family;Available PRN/intermittently Type of Home: House Home Access: Stairs to enter Entrance Stairs-Rails: None Entrance Stairs-Number of Steps: 2 Home Layout: Laundry or work area in basement;Able to live on main level with bedroom/bathroom Home Equipment: None      Prior Function Level of Independence: Independent               Journalist, newspaper        Extremity/Trunk Assessment   Upper Extremity Assessment Upper Extremity Assessment: Defer to OT evaluation    Lower Extremity Assessment Lower Extremity Assessment: Generalized weakness;RLE deficits/detail RLE Deficits / Details: Reports RLE weakness at baseline  Cervical / Trunk Assessment Cervical / Trunk Assessment: Other exceptions Cervical / Trunk Exceptions: recent  lumbar surgery  Communication   Communication: No difficulties  Cognition Arousal/Alertness: Awake/alert Behavior During Therapy: WFL for tasks assessed/performed Overall Cognitive Status: Within Functional Limits for tasks assessed                                        General Comments      Exercises     Assessment/Plan    PT Assessment Patient needs continued PT services  PT Problem List Decreased strength;Decreased mobility;Decreased activity tolerance;Pain       PT Treatment Interventions Stair training;Functional mobility training;Therapeutic activities;Gait training;DME instruction;Balance training;Therapeutic exercise;Patient/family education    PT Goals (Current goals can be found in the Care Plan section)  Acute Rehab PT Goals Patient Stated Goal: to decrease pain  PT Goal Formulation: With patient Time For Goal Achievement: 06/16/19 Potential to Achieve Goals: Good    Frequency Min 3X/week   Barriers to discharge        Co-evaluation               AM-PAC PT "6 Clicks" Mobility  Outcome Measure Help needed turning from your back to your side while in a flat bed without using bedrails?: A Little Help needed moving from lying on your back to sitting on the side of a flat bed without using bedrails?: A Little Help needed moving to and from a bed to a chair (including a wheelchair)?: A Little Help needed standing up from a chair using your arms (e.g., wheelchair or bedside chair)?: A Little Help needed to walk in hospital room?: A Little Help needed climbing 3-5 steps with a railing? : A Little 6 Click Score: 18    End of Session Equipment Utilized During Treatment: Back brace Activity Tolerance: Patient tolerated treatment well Patient left: in bed;with call bell/phone within reach(sitting on EOB ) Nurse Communication: Mobility status PT Visit Diagnosis: Muscle weakness (generalized) (M62.81);Pain Pain - part of body: (back)     Time: [id number] PT Time Calculation (min) (ACUTE ONLY): 38 min   Charges:   PT Evaluation $PT Eval Moderate Complexity: 1 Mod PT Treatments $Gait Training: 23-37 mins        Lou Miner, DPT  Acute Rehabilitation Services  Pager: [phone] Office: [phone]   Rudean Hitt 06/02/2019, 3:38 PM

## 2019-06-02 NOTE — ED Provider Notes (Signed)
Patient discharged by previous shift.  Has had increasing back pain over the past week after surgery for lumbar fusion on January 22.  He did have an MRI that showed: IMPRESSION: 1. Postoperative changes from interval PLIF at L5-S1 with extensive postoperative changes throughout the epidural space. Irregular 8 mm T2 hyperintense density contacting the descending right S1 nerve root favored to reflect postoperative change as well, although a small residual or recurrent disc herniation not entirely excluded, incompletely assessed on this noncontrast examination. Follow-up with postcontrast imaging could be performed for complete characterization as clinically warranted. 2. Shallow left foraminal to extraforaminal disc protrusion at L3-4, contacting and potentially irritating the exiting left L3 nerve Root.  Nursing reports the patient is a unable to stand or get up or ambulate. Patient reports he was off of all narcotics 1 week after his surgery.  He has had severe pain for the past 4 days after sitting on a plastic chair for prolonged period of time but denies any fall or trauma. Has increased pain across his entire low back that radiates down his right leg causing "stabbing" intermittently.   D/w NP megan of neurosurgery reviewed MRI and agrees no emergent pathology.  Agrees with Decadron dosing.  States that Dr. Vertell Limber will evaluate in the morning if hospitalist can admit for pain control  Labs are reassuring.  CT scan does not show any alternative explanation for patient's pain.  He still cannot stand or ambulate.  Admission discussed with Dr. Karis Juba, MD 06/02/19 [phone]

## 2019-06-02 NOTE — ED Notes (Signed)
Pt transported to CT ?

## 2019-06-02 NOTE — Progress Notes (Signed)
PROGRESS NOTE  Brief Narrative: Robert [name] is a 44 y.o. male with a history of hypothyroidism and ADHD as well as lumbar spinal fusion [id number] with uncomplicated postoperative course who presented to the ED with continuous, progressive, severe, debilitation lower back pain that worsened after prolonged sitting in a folding chair. In the ED he was afebrile with MR imaging of the lumbar spine showing postoperative changes and CT renal study unremarkable. IV dilaudid and decadron were given with plans to discharge home though the patient is unable to stand. Neurosurgery was consulted, recommended pain control.   Subjective: Pain returning after IV dilaudid doses last night, still hasn't been able to get up. Remains in stretcher in ED hallway.   Objective: BP (!) 160/90 (BP Location: Right Arm)   Pulse 85   Temp 98.1 F (36.7 C) (Oral)   Resp 18   Ht 6\' 3"  (1.905 m)   Wt 130.6 kg   SpO2 97%   BMI 36.00 kg/m   Gen: No distress.  Patient admitted a couple hours ago and just examined by neurosurgery, repeat exam in hallway is deferred.  Assessment & Plan: Intractable lower back pain: Most consistent with severe inflammation from provocation after initial postoperative healing. Imaging reassuring against persistent disc herniation, infection, or other surgical complications. Exam not consistent with neurological impairment of surgical site infection. No incontinence or LE weakness - Neurosurgery, Dr. Vertell Limber, has evaluated the patient, recommending steroid and activity moderation.  - Will order multimodal pain management with hopes of converting to oral regimen that will allow for functional mobility prior to discharge home.  - Flexeril 10mg  TID - Tylenol 1g TID - OxyIR 5-10mg  q4h prn moderate-severe pain - Dilaudid 1mg  IV q4h breakthrough pain (tolerated dilaudid previous despite morphine allergy listed) - Was told to avoid NSAIDs postoperatively, will not order for now. - Modify doses  as needed  - PT/OT  Obesity: BMI 36. Noted.   Hypothyroidism: Recent TSH 1.01 - Continue thyroid supplement  ADHD:  - Continue home adderall  Patrecia Pour, MD Pager on amion 06/02/2019, 10:17 AM

## 2019-06-02 NOTE — Plan of Care (Signed)
  Problem: Pain Managment: Goal: General experience of comfort will improve Outcome: Progressing   

## 2019-06-02 NOTE — Progress Notes (Signed)
New Admission Note:   Arrival Method: bed  Mental Orientation: Alert and oriented x4  Telemetry: none  Assessment: Completed Skin: intact  IV: left hand  Pain: 3/10  Tubes: none  Safety Measures: Safety Fall Prevention Plan has been given, discussed and signed Admission: Completed [facility] Orientation: Patient has been orientated to the room, unit and staff.  Family: none   Orders have been reviewed and implemented. Will continue to monitor the patient. Call light has been placed within reach and bed alarm has been activated.   Christol Thetford RN [facility] Phone: [phone]

## 2019-06-02 NOTE — ED Notes (Signed)
Multiple attempts made to d/c patient; pt insists that he "can't move" due to pain. Pt is able to move his legs, but struggles to move from side to side. Pt is unable to sit-up, and continuously cries. [facility] notified.

## 2019-06-02 NOTE — H&P (Signed)
History and Physical    Robert [name] [id number] DOB: [birth year] DOA: 06/01/2019  PCP: Marin Olp, MD  Patient coming from: Home.  Chief Complaint: Low back pain.  HPI: Robert [name] is a 44 y.o. male with history of ADHD hypothyroidism who had a lumbar fusion surgery and discharged on April 22, 2019 2 months ago was doing fine until last week when patient had a prolonged sitting on a chair following which patient started developing severe back pain which has been continuously worsening.  Denies any weakness of the extremities or any incontinence of the urine or bowel.  Given the pain patient presents to the ER.  ED Course: In the ER patient is afebrile and MRI of the L-spine shows postoperative changes with disc protrusion not ruled out and CT renal study was unremarkable.  On-call neurosurgery team has been consulted and recommended giving 1 short of Decadron which was given.  And they will be seeing patient in consult.  On exam patient is able to move all extremities but has significant pain on moving lower extremity.  Covid test is pending complete metabolic panel and CBC unremarkable.  Review of Systems: As per HPI, rest all negative.   Past Medical History:  Diagnosis Date  . ADD (attention deficit disorder) without hyperactivity    according to his mother/ work test possible  . Anxiety   . Chronic sinusitis   . Depression   . Headache   . History of kidney stones    passed stone  . Hyperlipidemia    no meds, diet controlled  . Hypertension    recently dx 2 months ago, MD just monitoring, no meds at this time  . Hypothyroidism   . Sleep apnea    Uses CPAP nightly.    Past Surgical History:  Procedure Laterality Date  . HERNIA REPAIR     pt. was an infant when this surgery occured  . NASAL SINUS SURGERY    . THYROID SURGERY Right 02/28/2014   DR TEOH    PARTIAL THYROIDECTOMY  . THYROIDECTOMY Right 02/28/2014   Procedure: RIGHT HEMI THYROIDECTOMY;   Surgeon: Ascencion Dike, MD;  Location: [facility];  Service: ENT;  Laterality: Right;  . WISDOM TOOTH EXTRACTION       reports that he has never smoked. He has never used smokeless tobacco. He reports current alcohol use of about 1.0 - 5.0 standard drinks of alcohol per week. He reports that he does not use drugs.  Allergies  Allergen Reactions  . Penicillins Anaphylaxis  . Milk-Related Compounds Other (See Comments)    GI issues   . Morphine And Related Itching    Full body itching  . Topamax Other (See Comments)    FACE BILATERAL NUMBNESS    Family History  Problem Relation Age of Onset  . Hyperlipidemia Mother   . Hypertension Mother   . Thyroid disease Neg Hx     Prior to Admission medications   Medication Sig Start Date End Date Taking? Authorizing Provider  amphetamine-dextroamphetamine (ADDERALL XR) 30 MG 24 hr capsule Take 30 mg by mouth daily.  08/30/17   [provider]  ARMOUR THYROID 120 MG tablet Take 120 mg by mouth daily before breakfast.  12/26/17   [provider]  Ascorbic Acid (VITAMIN C) 1000 MG tablet Take 1,000 mg by mouth daily.    [provider]  Budesonide 90 MCG/ACT inhaler Inhale 2 puffs into the lungs 2 (two) times daily. 05/22/19  Marin Olp, MD  Cholecalciferol (VITAMIN D3 PO) Take 1 capsule by mouth daily.    [provider]  cyclobenzaprine (FLEXERIL) 10 MG tablet Take 1 tablet (10 mg total) by mouth 3 (three) times daily as needed for muscle spasms. 06/24/18   Hassell Done, Mary-Margaret, FNP  diazepam (VALIUM) 2 MG tablet Take 1 tablet (2 mg total) by mouth every 4 (four) hours as needed for muscle spasms. 06/01/19   Lacretia Leigh, MD  Diclofenac Sodium (PENNSAID) 2 % SOLN Place 2 g onto the skin 2 (two) times daily. 07/04/18   Lyndal Pulley, DO  gabapentin (NEURONTIN) 300 MG capsule One tab PO qHS for a week, then BID for a week, then TID. May double weekly to a max of 3,600mg /day Patient taking differently: Take 900 mg  by mouth 3 (three) times daily.  02/16/19   Gregor Hams, MD  LORazepam (ATIVAN) 0.5 MG tablet Take 1 tablet (0.5 mg total) by mouth 2 (two) times daily as needed for anxiety. 01/20/19   Marin Olp, MD  meloxicam (MOBIC) 15 MG tablet Take 1 tablet (15 mg total) by mouth daily. 06/24/18   Hassell Done, Mary-Margaret, FNP  Menaquinone-7 (VITAMIN K2 PO) Take 1 capsule by mouth daily.    [provider]  oxyCODONE-acetaminophen (PERCOCET) 5-325 MG tablet Take 1 tablet by mouth every 4 (four) hours as needed for severe pain. 04/22/19 04/21/20  Meyran, Ocie Cornfield, NP  SUMAtriptan (IMITREX) 50 MG tablet Take 1 tablet (50 mg total) by mouth every 2 (two) hours as needed for migraine. May repeat in 2 hours if headache persists or recurs. 09/09/17   Marin Olp, MD  tadalafil (CIALIS) 20 MG tablet Take 0.5-1 tablets (10-20 mg total) by mouth every other day as needed for erectile dysfunction. 10/28/18   Inda Coke, PA  tamsulosin (FLOMAX) 0.4 MG CAPS capsule Take 0.4 mg by mouth daily.    [provider]    Physical Exam: Constitutional: Moderately built and nourished. Vitals:   06/01/19 1822 06/01/19 1845 06/02/19 0400  BP:  (!) 150/93 125/83  Pulse:  90 72  Resp:  19   Temp:  98.2 F (36.8 C)   TempSrc:  Oral   SpO2:  99% 92%  Weight: 130.6 kg    Height: 6\' 3"  (1.905 m)     Eyes: Anicteric no pallor. ENMT: No discharge from the ears eyes nose or mouth. Neck: No mass or.  No neck rigidity. Respiratory: No rhonchi or crepitations. Cardiovascular: S1-S2 heard. Abdomen: Soft nontender bowel sounds present. Musculoskeletal: No edema. Skin: No rash. Neurologic: Alert awake oriented to time place and person.  Moves all extremities. Psychiatric: Appears normal normal affect.   Labs on Admission: I have personally reviewed following labs and imaging studies  CBC: Recent Labs  Lab 06/02/19 0121  WBC 7.8  NEUTROABS 4.0  HGB 15.7  HCT 46.8  MCV 83.7  PLT [id number]    Basic Metabolic Panel: Recent Labs  Lab 06/02/19 0121  NA 139  K 4.2  CL 104  CO2 25  GLUCOSE 91  BUN 13  CREATININE 0.96  CALCIUM 9.4   GFR: Estimated Creatinine Clearance: 147.4 mL/min (by C-G formula based on SCr of 0.96 mg/dL). Liver Function Tests: No results for input(s): AST, ALT, ALKPHOS, BILITOT, PROT, ALBUMIN in the last 168 hours. No results for input(s): LIPASE, AMYLASE in the last 168 hours. No results for input(s): AMMONIA in the last 168 hours. Coagulation Profile: No results for input(s): INR,  PROTIME in the last 168 hours. Cardiac Enzymes: No results for input(s): CKTOTAL, CKMB, CKMBINDEX, TROPONINI in the last 168 hours. BNP (last 3 results) No results for input(s): PROBNP in the last 8760 hours. HbA1C: No results for input(s): HGBA1C in the last 72 hours. CBG: No results for input(s): GLUCAP in the last 168 hours. Lipid Profile: No results for input(s): CHOL, HDL, LDLCALC, TRIG, CHOLHDL, LDLDIRECT in the last 72 hours. Thyroid Function Tests: No results for input(s): TSH, T4TOTAL, FREET4, T3FREE, THYROIDAB in the last 72 hours. Anemia Panel: No results for input(s): VITAMINB12, FOLATE, FERRITIN, TIBC, IRON, RETICCTPCT in the last 72 hours. Urine analysis:    Component Value Date/Time   COLORURINE GREEN (A) 05/22/2014 1421   APPEARANCEUR CLOUDY (A) 05/22/2014 1421   LABSPEC 1.040 (H) 05/22/2014 1421   PHURINE 6.0 05/22/2014 1421   GLUCOSEU NEGATIVE 05/22/2014 1421   HGBUR TRACE (A) 05/22/2014 1421   HGBUR negative 02/12/2009 0920   BILIRUBINUR n 10/13/2016 1056   KETONESUR NEGATIVE 05/22/2014 1421   PROTEINUR n 10/13/2016 1056   PROTEINUR NEGATIVE 05/22/2014 1421   UROBILINOGEN 0.2 10/13/2016 1056   UROBILINOGEN 0.2 05/22/2014 1421   NITRITE n 10/13/2016 1056   NITRITE NEGATIVE 05/22/2014 1421   LEUKOCYTESUR Negative 10/13/2016 1056   Sepsis Labs: @LABRCNTIP (procalcitonin:4,lacticidven:4) )No results found for this or any previous visit  (from the past 240 hour(s)).   Radiological Exams on Admission: MR Lumbar Spine Wo Contrast  Result Date: 06/01/2019 CLINICAL DATA:  Initial evaluation for low back pain with progressive neurologic deficit, history of prior surgery. EXAM: MRI LUMBAR SPINE WITHOUT CONTRAST TECHNIQUE: Multiplanar, multisequence MR imaging of the lumbar spine was performed. No intravenous contrast was administered. COMPARISON:  Prior radiograph from 04/21/2019 as well as previous MRI from 02/19/2019. FINDINGS: Segmentation: Standard. Lowest well-formed disc space labeled the L5-S1 level. Alignment: Chronic 3 mm anterolisthesis of L5 on S1, stable. Alignment otherwise normal with preservation of the normal lumbar lordosis. Vertebrae: Susceptibility artifact from prior PLIF seen at the L5-S1 level. Vertebral body height maintained without evidence for acute or chronic fracture. Bone marrow signal intensity diffusely decreased on T1 weighted imaging, nonspecific, but most commonly related to anemia, smoking, or obesity. No discrete or worrisome osseous lesions. No abnormal marrow edema. Conus medullaris and cauda equina: Conus extends to the T12-L1 level. Conus and cauda equina appear normal. Paraspinal and other soft tissues: Postoperative changes from recent PLIF seen within the lower posterior paraspinous musculature. Small T2 hyperintense collection measuring 1.4 x 1.1 cm seen along the lower midline incision (series 8, image 30). No significant surrounding inflammatory changes. Finding most likely reflects a benign postoperative seroma. Paraspinous soft tissues demonstrate no other acute finding. Visualized visceral structures are normal. Disc levels: T12-L1: Normal interspace. Mild facet hypertrophy. No canal or foraminal stenosis. L1-2: Normal interspace. Mild bilateral facet hypertrophy. No canal or neural foraminal stenosis. L2-3: Normal interspace. Mild to moderate facet hypertrophy. No canal or foraminal stenosis. L3-4:  Shallow left foraminal to extraforaminal disc protrusion contacts the exiting left L3 nerve root as it courses of the left neural foramen (series 8, image 23). Mild facet hypertrophy. No canal stenosis. Mild bilateral L3 foraminal narrowing, slightly worse on the left. L4-5: No significant disc bulge. Small endplate Schmorl's node deformity noted at the superior endplate of L5. Moderate bilateral facet hypertrophy. No significant canal or lateral recess stenosis. Mild bilateral L4 foraminal stenosis. L5-S1: Postoperative changes from interval interbody and posterior fusion with wide posterior decompression. Extensive soft tissue density involving the  epidural space in a circumferential fashion likely reflects postoperative granulation tissue. Irregular cystic density at the level of the previously seen right subarticular disc protrusion likely reflects postoperative changes as well. There is a heterogeneous T2 density measuring 8 mm in diameter contacting the descending right S1 nerve root (series 8, image 35). While this finding could reflect postoperative change, a small residual and/or recurrent disc herniation could also have this appearance, not fully assessed on this noncontrast examination. IMPRESSION: 1. Postoperative changes from interval PLIF at L5-S1 with extensive postoperative changes throughout the epidural space. Irregular 8 mm T2 hyperintense density contacting the descending right S1 nerve root favored to reflect postoperative change as well, although a small residual or recurrent disc herniation not entirely excluded, incompletely assessed on this noncontrast examination. Follow-up with postcontrast imaging could be performed for complete characterization as clinically warranted. 2. Shallow left foraminal to extraforaminal disc protrusion at L3-4, contacting and potentially irritating the exiting left L3 nerve root. Electronically Signed   By: Jeannine Boga M.D.   On: 06/01/2019 23:11   CT  Renal Stone Study  Result Date: 06/02/2019 CLINICAL DATA:  Back pain EXAM: CT ABDOMEN AND PELVIS WITHOUT CONTRAST TECHNIQUE: Multidetector CT imaging of the abdomen and pelvis was performed following the standard protocol without IV contrast. COMPARISON:  June 01, 2019 FINDINGS: Lower chest: The visualized heart size within normal limits. No pericardial fluid/thickening. No hiatal hernia. Mild basilar atelectasis is noted. Hepatobiliary: Although limited due to the lack of intravenous contrast, normal in appearance without gross focal abnormality. No evidence of calcified gallstones or biliary ductal dilatation. Pancreas:  Unremarkable.  No surrounding inflammatory changes. Spleen: Normal in size. Although limited due to the lack of intravenous contrast, normal in appearance. Adrenals/Urinary Tract: Both adrenal glands appear normal. The kidneys and collecting system appear normal without evidence of urinary tract calculus or hydronephrosis. Bladder is unremarkable. Stomach/Bowel: The stomach, small bowel, and colon are normal in appearance. Scattered colonic diverticula are noted. No inflammatory changes or obstructive findings. appendix is normal. Vascular/Lymphatic: There are no enlarged abdominal or pelvic lymph nodes. No significant gross vascular findings are present. Reproductive: The prostate is unremarkable. Other: No evidence of abdominal wall mass or hernia. Musculoskeletal: The patient is status post decompression 1 and fixation 2 at L5-S1 with interbody fusion. No periprosthetic lucency or fracture is identified. No definite osseous fusion seen within the interbody spacer at L5-S1. Postsurgical changes are seen along the posterior paraspinal soft tissues. IMPRESSION: No renal or collecting system calculi. Scattered colonic diverticula without diverticulitis. Status post decompression and fixation at L5-S1. No acute hardware complication. No definite interbody fusion seen Electronically Signed   By:  Prudencio Pair M.D.   On: 06/02/2019 01:50      Assessment/Plan Principal Problem:   Low back pain Active Problems:   ADHD (attention deficit hyperactivity disorder)    1. Low back pain -cause not clear.  Has had recent lumbar fusion surgery.  Neurosurgery has been consulted 1 dose of Decadron 10 mg IV has been given and patient is in pain leave medication.  Will await further recommendation per neurosurgery. 2. ADHD on Adderall. 3. Hypothyroidism on Synthroid.  Covid test is pending.   DVT prophylaxis: SCDs in anticipation of procedure. Code Status: Full code. Family Communication: Discussed with patient. Disposition Plan: Home. Consults called: Neurosurgery. Admission status: Observation.   Rise Patience MD [facility] Pager [phone].  If 7PM-7AM, please contact night-coverage [email or web address] Password [facility]  06/02/2019, 5:16 AM

## 2019-06-03 DIAGNOSIS — M545 Low back pain: Secondary | ICD-10-CM | POA: Diagnosis not present

## 2019-06-03 DIAGNOSIS — F909 Attention-deficit hyperactivity disorder, unspecified type: Secondary | ICD-10-CM | POA: Diagnosis not present

## 2019-06-03 MED ORDER — SENNOSIDES-DOCUSATE SODIUM 8.6-50 MG PO TABS: 1.0000 | ORAL_TABLET | Freq: Every evening | ORAL | Status: DC | PRN

## 2019-06-03 MED ORDER — DEXAMETHASONE 2 MG PO TABS: ORAL_TABLET | ORAL | 0 refills | Status: DC

## 2019-06-03 MED ORDER — SENNOSIDES-DOCUSATE SODIUM 8.6-50 MG PO TABS: 1.0000 | ORAL_TABLET | Freq: Every day | ORAL | Status: DC | PRN

## 2019-06-03 NOTE — Progress Notes (Signed)
Occupational Therapy Evaluation Patient Details Name: Robert [name] MRN: [id number] DOB: 06-25-1975 Today's Date: 06/03/2019    History of Present Illness Pt is a 44 y/o male admitted secondary to increased lower back pain, likely secondary to prolonged period of sitting per notes. Pt with recent back surgery. Pt also with recent COVID infection as well. PMH includes HTN and sleep apnea on CPAP.    Clinical Impression   Pt. was educated and was provided handouts for back precautions. Pt. Was able to don/doff back brace at independent level. Pt. demonstrated  ability to perform ADLs following back precautions. Pt. Lives at home with wife who is assisting with home management tasks at this time. Pt. Is cooking at home and he was educated on back precautions for meal prep. Pt. States he is also picking up daughter and was educated to sit down and have daughter climb on his lap or have his wife hand him his daughter. Educated pt. To follow his back precautions. Pt. Educated on sleeping positions to follow back precautions and referred to handouts. Pt. Was educated on pressure relief for back throughout day. No further skilled needs at this time.     Follow Up Recommendations  No OT follow up    Equipment Recommendations  None recommended by OT    Recommendations for Other Services       Precautions / Restrictions Precautions Precautions: Back Precaution Booklet Issued: No Precaution Comments: Verbally reviewed back precautions.  Required Braces or Orthoses: Spinal Brace Spinal Brace: Lumbar corset;Applied in sitting position Restrictions Weight Bearing Restrictions: No      Mobility Bed Mobility Overal bed mobility: Needs Assistance   Rolling: Modified independent (Device/Increase time) Sidelying to sit: Modified independent (Device/Increase time)       General bed mobility comments: Pt. performed supine to sit and sit to supine. Ed patinet on importance of following back  precautions. Discussed with patient use of body pillow when side lying in bed to increase postion and compliance. Ed patient if he does sleep on back to have pillow under legs.   Transfers Overall transfer level: Modified independent               General transfer comment: Pt. is aware of back precautions and is following them.    Balance Overall balance assessment: No apparent balance deficits (not formally assessed)                                         ADL either performed or assessed with clinical judgement   ADL Overall ADL's : At baseline                                       General ADL Comments: Pt. ed and given handout for back precautions. Pt. is able to follow back precautions and is safe with performing. Discussed home mangement tasks of cooking and he is following back precautions.      Vision Baseline Vision/History: Wears glasses Wears Glasses: At all times       Perception     Praxis      Pertinent Vitals/Pain Pain Assessment: 0-10 Pain Score: 4  Pain Location: back Pain Descriptors / Indicators: Dull;Aching Pain Intervention(s): Monitored during session;RN gave pain meds during session     Hand Dominance  Extremity/Trunk Assessment Upper Extremity Assessment Upper Extremity Assessment: Overall WFL for tasks assessed   Lower Extremity Assessment Lower Extremity Assessment: Defer to PT evaluation   Cervical / Trunk Assessment Cervical / Trunk Assessment: Other exceptions Cervical / Trunk Exceptions: recent lumbar surgery   Communication Communication Communication: No difficulties   Cognition Arousal/Alertness: Awake/alert Behavior During Therapy: WFL for tasks assessed/performed Overall Cognitive Status: Within Functional Limits for tasks assessed                                     General Comments  Discussed with patient to have laying down rest breaks during the day. Patient ed to  take several laying down breaks and walking breaks throughout day.     Exercises     Shoulder Instructions      Home Living Family/patient expects to be discharged to:: Private residence Living Arrangements: Spouse/significant other Available Help at Discharge: Family;Available PRN/intermittently Type of Home: House Home Access: Stairs to enter [employer] of Steps: 2 Entrance Stairs-Rails: None Home Layout: Laundry or work area in basement;Able to live on main level with bedroom/bathroom     Bathroom Shower/Tub: Occupational psychologist: Handicapped height Bathroom Accessibility: Yes How Accessible: Accessible via walker Home Equipment: None          Prior Functioning/Environment Level of Independence: Independent        Comments: works in [employer], can work from home        OT Problem List: Decreased knowledge of precautions;Pain      OT Treatment/Interventions:      OT Goals(Current goals can be found in the care plan section) Acute Rehab OT Goals Patient Stated Goal: to go home OT Goal Formulation: All assessment and education complete, DC therapy  OT Frequency:     Barriers to D/C:            Co-evaluation              AM-PAC OT "6 Clicks" Daily Activity     Outcome Measure Help from another person eating meals?: None Help from another person taking care of personal grooming?: None Help from another person toileting, which includes using toliet, bedpan, or urinal?: None Help from another person bathing (including washing, rinsing, drying)?: None Help from another person to put on and taking off regular upper body clothing?: None Help from another person to put on and taking off regular lower body clothing?: None 6 Click Score: 24   End of Session Nurse Communication: (nurse gave pain meds during session)  Activity Tolerance: Patient tolerated treatment well Patient left: in bed;with call bell/phone within reach  OT Visit  Diagnosis: Pain Pain - part of body: (back)                Time: [id number] OT Time Calculation (min): 24 min Charges:  OT General Charges $OT Visit: 1 Visit OT Evaluation $OT Eval Low Complexity: 1 Low  Reece Packer OT/L  [place] 06/03/2019, 9:58 AM

## 2019-06-03 NOTE — Progress Notes (Addendum)
Discharged to home, AVS reviewed with the patient.   Patient takes Ativan at home PRN for anxiety and Valium was added this admission for muscle spasms. Confirmed with MD that this is correct. Reviewed medication list with patient who verbalized understanding that multiple medications can cause drowsiness/sedations and he should use cation when taking meds.

## 2019-06-03 NOTE — Discharge Summary (Signed)
PATIENT DETAILS Name: Robert [name] Age: 44 y.o. Sex: male Date of Birth: 04-Apr-1975 MRN: [id number]. Admitting Physician: Rise Patience, MD [id number], Brayton Mars, MD  Admit Date: 06/01/2019 Discharge date: 06/03/2019  Recommendations for Outpatient Follow-up:  1. Follow up with PCP in 1-2 weeks 2. Please obtain CMP/CBC in one week 3. Please ensure follow-up with neurosurgery  Admitted From:  Home  Disposition: Outpatient PT   Home Health: No  Equipment/Devices: None  Discharge Condition: Stable  CODE STATUS: FULL CODE  Diet recommendation:  Diet Order            Diet general        Diet regular Room service appropriate? Yes; Fluid consistency: Thin  Diet effective now               Brief Summary: See H&P, Labs, Consult and Test reports for all details in brief, patient is a 44 year old male with history of hypothyroidism, ADHD-s/p lumbar spinal fusion on 1/23-who presented with intractable back pain.  MRI imaging of lumbar spine shows postoperative changes.  Patient was started on IV narcotics-and IV Decadron with remarkable improvement.  Being discharged home in a stable manner.  Please see below for further details.    Brief Hospital Course: Intractable lower back pain: Secondary to inflammation at the operative site of recent spinal fusion.  Remarkable improvement after starting IV Decadron.  This morning he appears very comfortable-easily able to lift his extremities off the bed against resistance and gravity.  He claims he ambulated around the unit twice.  He has very minimal pain.  He is anxious to go home-I spoke with neurosurgery on-call-Dr. Thomas-recommendations are to continue Decadron on discharge-and taper over 10-14 days.  Patient asked to follow-up with Dr. Vertell Limber in the next week or so.  Hypothyroidism: Continue with usual thyroid supplement-follow with PCP.  ADHD: Continue regular dosing of Ativan-follow-up with PCP.  Recent Covid  positive: Per patient-he was apparently positive for Covid in middle of February.  He is completely asymptomatic.  His PCR on 3/5 was negative.  Obesity: Estimated body mass index is 34.86 kg/m as calculated from the following:   Height as of this encounter: 6\' 3"  (1.905 m).   Weight as of this encounter: 126.5 kg.    Procedures None  Discharge Diagnoses:  Principal Problem:   Low back pain Active Problems:   ADHD (attention deficit hyperactivity disorder)   Discharge Instructions:  Activity:  As tolerated  Discharge Instructions    Diet general   Complete by: As directed    Discharge instructions   Complete by: As directed    Follow with Primary MD  Marin Olp, MD in 1-2 weeks  Follow with Dr. Antonieta Pert in the next 1-2 weeks.  Please call his office on Monday for an appointment.  Please get a complete blood count and chemistry panel checked by your Primary MD at your next visit, and again as instructed by your Primary MD.  Get Medicines reviewed and adjusted: Please take all your medications with you for your next visit with your Primary MD  Laboratory/radiological data: Please request your Primary MD to go over all hospital tests and procedure/radiological results at the follow up, please ask your Primary MD to get all Hospital records sent to his/her office.  In some cases, they will be blood work, cultures and biopsy results pending at the time of your discharge. Please request that your primary care M.D. follows up on these results.  Also  Note the following: If you experience worsening of your admission symptoms, develop shortness of breath, life threatening emergency, suicidal or homicidal thoughts you must seek medical attention immediately by calling 911 or calling your MD immediately  if symptoms less severe.  You must read complete instructions/literature along with all the possible adverse reactions/side effects for all the Medicines you take  and that have been prescribed to you. Take any new Medicines after you have completely understood and accpet all the possible adverse reactions/side effects.   Do not drive when taking Pain medications or sleeping medications (Benzodaizepines)  Do not take more than prescribed Pain, Sleep and Anxiety Medications. It is not advisable to combine anxiety,sleep and pain medications without talking with your primary care practitioner  Special Instructions: If you have smoked or chewed Tobacco  in the last 2 yrs please stop smoking, stop any regular Alcohol  and or any Recreational drug use.  Wear Seat belts while driving.  Please note: You were cared for by a hospitalist during your hospital stay. Once you are discharged, your primary care physician will handle any further medical issues. Please note that NO REFILLS for any discharge medications will be authorized once you are discharged, as it is imperative that you return to your primary care physician (or establish a relationship with a primary care physician if you do not have one) for your post hospital discharge needs so that they can reassess your need for medications and monitor your lab values.   Increase activity slowly   Complete by: As directed      Allergies as of 06/03/2019      Reactions   Penicillins Anaphylaxis   Did it involve swelling of the face/tongue/throat, SOB, or low BP? Yes Did it involve sudden or severe rash/hives, skin peeling, or any reaction on the inside of your mouth or nose? Yes Did you need to seek medical attention at a hospital or doctor's office? Yes When did it last happen?27-41 years old If all above answers are "NO", may proceed with cephalosporin use.   Milk-related Compounds Other (See Comments)   GI issues Pt reports that he cannot drink milk but can tolerate other dairy products   Morphine And Related Itching   Full body itching   Topamax Other (See Comments)   FACE BILATERAL NUMBNESS        Medication List    STOP taking these medications   Diclofenac Sodium 2 % Soln Commonly known as: Pennsaid   meloxicam 15 MG tablet Commonly known as: MOBIC     TAKE these medications   amphetamine-dextroamphetamine 30 MG 24 hr capsule Commonly known as: ADDERALL XR Take 30 mg by mouth daily.   Armour Thyroid 120 MG tablet Generic drug: thyroid Take 120 mg by mouth daily before breakfast.   Budesonide 90 MCG/ACT inhaler Inhale 2 puffs into the lungs 2 (two) times daily. What changed:   when to take this  reasons to take this   cyclobenzaprine 10 MG tablet Commonly known as: FLEXERIL Take 1 tablet (10 mg total) by mouth 3 (three) times daily as needed for muscle spasms. What changed: when to take this   dexamethasone 2 MG tablet Commonly known as: DECADRON Take 5 tabs (10 mg) for 2 days, then 4 tabs (8mg ) for 2 days, then 6 mg (3 tabs) for 2 days, then 4 mg (2tabs) for 2 days, then 2 mg (1 tab) for 2 days and then 1 mg (0.5 tabs) for 2 days and then  stop   diazepam 2 MG tablet Commonly known as: Valium Take 1 tablet (2 mg total) by mouth every 4 (four) hours as needed for muscle spasms.   gabapentin 300 MG capsule Commonly known as: NEURONTIN One tab PO qHS for a week, then BID for a week, then TID. May double weekly to a max of 3,600mg /day What changed:   how much to take  how to take this  when to take this  additional instructions   LORazepam 0.5 MG tablet Commonly known as: ATIVAN Take 1 tablet (0.5 mg total) by mouth 2 (two) times daily as needed for anxiety.   oxyCODONE-acetaminophen 5-325 MG tablet Commonly known as: Percocet Take 1 tablet by mouth every 4 (four) hours as needed for severe pain. What changed: when to take this   SUMAtriptan 50 MG tablet Commonly known as: IMITREX Take 1 tablet (50 mg total) by mouth every 2 (two) hours as needed for migraine. May repeat in 2 hours if headache persists or recurs.   tadalafil 20 MG  tablet Commonly known as: CIALIS Take 0.5-1 tablets (10-20 mg total) by mouth every other day as needed for erectile dysfunction.   tamsulosin 0.4 MG Caps capsule Commonly known as: FLOMAX Take 0.4 mg by mouth daily.   vitamin C 1000 MG tablet Take 1,000 mg by mouth daily.   VITAMIN D3 PO Take 1 capsule by mouth daily.   VITAMIN K2 PO Take 1 capsule by mouth daily.            Durable Medical Equipment  (From admission, onward)         Start     Ordered   06/03/19 0843  For home use only DME Walker rolling  Once    Question Answer Comment  Walker: With 5 Inch Wheels   Patient needs a walker to treat with the following condition Physical deconditioning      06/03/19 0842         Follow-up Information    Marin Olp, MD. Schedule an appointment as soon as possible for a visit in 2 week(s).   Specialty: Family Medicine Contact information: [place] [address] [phone]        Erline Levine, MD. Schedule an appointment as soon as possible for a visit in 1 week(s).   Specialty: Neurosurgery Contact information: [address] [place] [address] [phone]          Allergies  Allergen Reactions  . Penicillins Anaphylaxis    Did it involve swelling of the face/tongue/throat, SOB, or low BP? Yes Did it involve sudden or severe rash/hives, skin peeling, or any reaction on the inside of your mouth or nose? Yes Did you need to seek medical attention at a hospital or doctor's office? Yes When did it last happen?95-87 years old If all above answers are "NO", may proceed with cephalosporin use.   . Milk-Related Compounds Other (See Comments)    GI issues Pt reports that he cannot drink milk but can tolerate other dairy products  . Morphine And Related Itching    Full body itching  . Topamax Other (See Comments)    FACE BILATERAL NUMBNESS    Consultations:   Neurosurgery   Other  Procedures/Studies: MR Lumbar Spine Wo Contrast  Result Date: 06/01/2019 CLINICAL DATA:  Initial evaluation for low back pain with progressive neurologic deficit, history of prior surgery. EXAM: MRI LUMBAR SPINE WITHOUT CONTRAST TECHNIQUE: Multiplanar, multisequence MR imaging of  the lumbar spine was performed. No intravenous contrast was administered. COMPARISON:  Prior radiograph from 04/21/2019 as well as previous MRI from 02/19/2019. FINDINGS: Segmentation: Standard. Lowest well-formed disc space labeled the L5-S1 level. Alignment: Chronic 3 mm anterolisthesis of L5 on S1, stable. Alignment otherwise normal with preservation of the normal lumbar lordosis. Vertebrae: Susceptibility artifact from prior PLIF seen at the L5-S1 level. Vertebral body height maintained without evidence for acute or chronic fracture. Bone marrow signal intensity diffusely decreased on T1 weighted imaging, nonspecific, but most commonly related to anemia, smoking, or obesity. No discrete or worrisome osseous lesions. No abnormal marrow edema. Conus medullaris and cauda equina: Conus extends to the T12-L1 level. Conus and cauda equina appear normal. Paraspinal and other soft tissues: Postoperative changes from recent PLIF seen within the lower posterior paraspinous musculature. Small T2 hyperintense collection measuring 1.4 x 1.1 cm seen along the lower midline incision (series 8, image 30). No significant surrounding inflammatory changes. Finding most likely reflects a benign postoperative seroma. Paraspinous soft tissues demonstrate no other acute finding. Visualized visceral structures are normal. Disc levels: T12-L1: Normal interspace. Mild facet hypertrophy. No canal or foraminal stenosis. L1-2: Normal interspace. Mild bilateral facet hypertrophy. No canal or neural foraminal stenosis. L2-3: Normal interspace. Mild to moderate facet hypertrophy. No canal or foraminal stenosis. L3-4: Shallow left foraminal to extraforaminal disc  protrusion contacts the exiting left L3 nerve root as it courses of the left neural foramen (series 8, image 23). Mild facet hypertrophy. No canal stenosis. Mild bilateral L3 foraminal narrowing, slightly worse on the left. L4-5: No significant disc bulge. Small endplate Schmorl's node deformity noted at the superior endplate of L5. Moderate bilateral facet hypertrophy. No significant canal or lateral recess stenosis. Mild bilateral L4 foraminal stenosis. L5-S1: Postoperative changes from interval interbody and posterior fusion with wide posterior decompression. Extensive soft tissue density involving the epidural space in a circumferential fashion likely reflects postoperative granulation tissue. Irregular cystic density at the level of the previously seen right subarticular disc protrusion likely reflects postoperative changes as well. There is a heterogeneous T2 density measuring 8 mm in diameter contacting the descending right S1 nerve root (series 8, image 35). While this finding could reflect postoperative change, a small residual and/or recurrent disc herniation could also have this appearance, not fully assessed on this noncontrast examination. IMPRESSION: 1. Postoperative changes from interval PLIF at L5-S1 with extensive postoperative changes throughout the epidural space. Irregular 8 mm T2 hyperintense density contacting the descending right S1 nerve root favored to reflect postoperative change as well, although a small residual or recurrent disc herniation not entirely excluded, incompletely assessed on this noncontrast examination. Follow-up with postcontrast imaging could be performed for complete characterization as clinically warranted. 2. Shallow left foraminal to extraforaminal disc protrusion at L3-4, contacting and potentially irritating the exiting left L3 nerve root. Electronically Signed   By: Jeannine Boga M.D.   On: 06/01/2019 23:11   DG Chest Port 1 View  Result Date:  05/23/2019 CLINICAL DATA:  COVID shortness of breath EXAM: PORTABLE CHEST 1 VIEW COMPARISON:  May 19, 2016 FINDINGS: The heart size and mediastinal contours are within normal limits. Both lungs are clear. The visualized skeletal structures are unremarkable. IMPRESSION: No active disease. Electronically Signed   By: Prudencio Pair M.D.   On: 05/23/2019 18:44   CT Renal Stone Study  Result Date: 06/02/2019 CLINICAL DATA:  Back pain EXAM: CT ABDOMEN AND PELVIS WITHOUT CONTRAST TECHNIQUE: Multidetector CT imaging of the abdomen and pelvis was  performed following the standard protocol without IV contrast. COMPARISON:  June 01, 2019 FINDINGS: Lower chest: The visualized heart size within normal limits. No pericardial fluid/thickening. No hiatal hernia. Mild basilar atelectasis is noted. Hepatobiliary: Although limited due to the lack of intravenous contrast, normal in appearance without gross focal abnormality. No evidence of calcified gallstones or biliary ductal dilatation. Pancreas:  Unremarkable.  No surrounding inflammatory changes. Spleen: Normal in size. Although limited due to the lack of intravenous contrast, normal in appearance. Adrenals/Urinary Tract: Both adrenal glands appear normal. The kidneys and collecting system appear normal without evidence of urinary tract calculus or hydronephrosis. Bladder is unremarkable. Stomach/Bowel: The stomach, small bowel, and colon are normal in appearance. Scattered colonic diverticula are noted. No inflammatory changes or obstructive findings. appendix is normal. Vascular/Lymphatic: There are no enlarged abdominal or pelvic lymph nodes. No significant gross vascular findings are present. Reproductive: The prostate is unremarkable. Other: No evidence of abdominal wall mass or hernia. Musculoskeletal: The patient is status post decompression 1 and fixation 2 at L5-S1 with interbody fusion. No periprosthetic lucency or fracture is identified. No definite osseous fusion  seen within the interbody spacer at L5-S1. Postsurgical changes are seen along the posterior paraspinal soft tissues. IMPRESSION: No renal or collecting system calculi. Scattered colonic diverticula without diverticulitis. Status post decompression and fixation at L5-S1. No acute hardware complication. No definite interbody fusion seen Electronically Signed   By: Prudencio Pair M.D.   On: 06/02/2019 01:50     TODAY-DAY OF DISCHARGE:  Subjective:   Robert [name] today has no headache,no chest abdominal pain,no new weakness tingling or numbness, feels much better wants to go home today.   Objective:   Blood pressure (!) 146/87, pulse 87, temperature (!) 97.5 F (36.4 C), temperature source Oral, resp. rate 18, height 6\' 3"  (1.905 m), weight 126.5 kg, SpO2 96 %.  Intake/Output Summary (Last 24 hours) at 06/03/2019 0903 Last data filed at 06/03/2019 0500 Gross per 24 hour  Intake 440 ml  Output 1800 ml  Net -1360 ml   Filed Weights   06/01/19 1822 06/02/19 1404  Weight: 130.6 kg 126.5 kg    Exam: Awake Alert, Oriented *3, No new F.N deficits, Normal affect Van.AT,PERRAL Supple Neck,No JVD, No cervical lymphadenopathy appriciated.  Symmetrical Chest wall movement, Good air movement bilaterally, CTAB RRR,No Gallops,Rubs or new Murmurs, No Parasternal Heave +ve B.Sounds, Abd Soft, Non tender, No organomegaly appriciated, No rebound -guarding or rigidity. No Cyanosis, Clubbing or edema, No new Rash or bruise   PERTINENT RADIOLOGIC STUDIES: MR Lumbar Spine Wo Contrast  Result Date: 06/01/2019 CLINICAL DATA:  Initial evaluation for low back pain with progressive neurologic deficit, history of prior surgery. EXAM: MRI LUMBAR SPINE WITHOUT CONTRAST TECHNIQUE: Multiplanar, multisequence MR imaging of the lumbar spine was performed. No intravenous contrast was administered. COMPARISON:  Prior radiograph from 04/21/2019 as well as previous MRI from 02/19/2019. FINDINGS: Segmentation: Standard.  Lowest well-formed disc space labeled the L5-S1 level. Alignment: Chronic 3 mm anterolisthesis of L5 on S1, stable. Alignment otherwise normal with preservation of the normal lumbar lordosis. Vertebrae: Susceptibility artifact from prior PLIF seen at the L5-S1 level. Vertebral body height maintained without evidence for acute or chronic fracture. Bone marrow signal intensity diffusely decreased on T1 weighted imaging, nonspecific, but most commonly related to anemia, smoking, or obesity. No discrete or worrisome osseous lesions. No abnormal marrow edema. Conus medullaris and cauda equina: Conus extends to the T12-L1 level. Conus and cauda equina appear normal. Paraspinal and other  soft tissues: Postoperative changes from recent PLIF seen within the lower posterior paraspinous musculature. Small T2 hyperintense collection measuring 1.4 x 1.1 cm seen along the lower midline incision (series 8, image 30). No significant surrounding inflammatory changes. Finding most likely reflects a benign postoperative seroma. Paraspinous soft tissues demonstrate no other acute finding. Visualized visceral structures are normal. Disc levels: T12-L1: Normal interspace. Mild facet hypertrophy. No canal or foraminal stenosis. L1-2: Normal interspace. Mild bilateral facet hypertrophy. No canal or neural foraminal stenosis. L2-3: Normal interspace. Mild to moderate facet hypertrophy. No canal or foraminal stenosis. L3-4: Shallow left foraminal to extraforaminal disc protrusion contacts the exiting left L3 nerve root as it courses of the left neural foramen (series 8, image 23). Mild facet hypertrophy. No canal stenosis. Mild bilateral L3 foraminal narrowing, slightly worse on the left. L4-5: No significant disc bulge. Small endplate Schmorl's node deformity noted at the superior endplate of L5. Moderate bilateral facet hypertrophy. No significant canal or lateral recess stenosis. Mild bilateral L4 foraminal stenosis. L5-S1: Postoperative  changes from interval interbody and posterior fusion with wide posterior decompression. Extensive soft tissue density involving the epidural space in a circumferential fashion likely reflects postoperative granulation tissue. Irregular cystic density at the level of the previously seen right subarticular disc protrusion likely reflects postoperative changes as well. There is a heterogeneous T2 density measuring 8 mm in diameter contacting the descending right S1 nerve root (series 8, image 35). While this finding could reflect postoperative change, a small residual and/or recurrent disc herniation could also have this appearance, not fully assessed on this noncontrast examination. IMPRESSION: 1. Postoperative changes from interval PLIF at L5-S1 with extensive postoperative changes throughout the epidural space. Irregular 8 mm T2 hyperintense density contacting the descending right S1 nerve root favored to reflect postoperative change as well, although a small residual or recurrent disc herniation not entirely excluded, incompletely assessed on this noncontrast examination. Follow-up with postcontrast imaging could be performed for complete characterization as clinically warranted. 2. Shallow left foraminal to extraforaminal disc protrusion at L3-4, contacting and potentially irritating the exiting left L3 nerve root. Electronically Signed   By: Jeannine Boga M.D.   On: 06/01/2019 23:11   CT Renal Stone Study  Result Date: 06/02/2019 CLINICAL DATA:  Back pain EXAM: CT ABDOMEN AND PELVIS WITHOUT CONTRAST TECHNIQUE: Multidetector CT imaging of the abdomen and pelvis was performed following the standard protocol without IV contrast. COMPARISON:  June 01, 2019 FINDINGS: Lower chest: The visualized heart size within normal limits. No pericardial fluid/thickening. No hiatal hernia. Mild basilar atelectasis is noted. Hepatobiliary: Although limited due to the lack of intravenous contrast, normal in appearance  without gross focal abnormality. No evidence of calcified gallstones or biliary ductal dilatation. Pancreas:  Unremarkable.  No surrounding inflammatory changes. Spleen: Normal in size. Although limited due to the lack of intravenous contrast, normal in appearance. Adrenals/Urinary Tract: Both adrenal glands appear normal. The kidneys and collecting system appear normal without evidence of urinary tract calculus or hydronephrosis. Bladder is unremarkable. Stomach/Bowel: The stomach, small bowel, and colon are normal in appearance. Scattered colonic diverticula are noted. No inflammatory changes or obstructive findings. appendix is normal. Vascular/Lymphatic: There are no enlarged abdominal or pelvic lymph nodes. No significant gross vascular findings are present. Reproductive: The prostate is unremarkable. Other: No evidence of abdominal wall mass or hernia. Musculoskeletal: The patient is status post decompression 1 and fixation 2 at L5-S1 with interbody fusion. No periprosthetic lucency or fracture is identified. No definite osseous fusion seen  within the interbody spacer at L5-S1. Postsurgical changes are seen along the posterior paraspinal soft tissues. IMPRESSION: No renal or collecting system calculi. Scattered colonic diverticula without diverticulitis. Status post decompression and fixation at L5-S1. No acute hardware complication. No definite interbody fusion seen Electronically Signed   By: Prudencio Pair M.D.   On: 06/02/2019 01:50     PERTINENT LAB RESULTS: CBC: Recent Labs    06/02/19 0121  WBC 7.8  HGB 15.7  HCT 46.8  PLT 366   CMET CMP     Component Value Date/Time   NA 139 06/02/2019 0121   K 4.2 06/02/2019 0121   CL 104 06/02/2019 0121   CO2 25 06/02/2019 0121   GLUCOSE 91 06/02/2019 0121   BUN 13 06/02/2019 0121   CREATININE 0.96 06/02/2019 0121   CALCIUM 9.4 06/02/2019 0121   PROT 6.4 03/03/2019 0847   ALBUMIN 4.5 03/03/2019 0847   AST 16 03/03/2019 0847   ALT 25  03/03/2019 0847   ALKPHOS 61 03/03/2019 0847   BILITOT 1.0 03/03/2019 0847   GFRNONAA >60 06/02/2019 0121   GFRAA >60 06/02/2019 0121    GFR Estimated Creatinine Clearance: 145.1 mL/min (by C-G formula based on SCr of 0.96 mg/dL). No results for input(s): LIPASE, AMYLASE in the last 72 hours. No results for input(s): CKTOTAL, CKMB, CKMBINDEX, TROPONINI in the last 72 hours. Invalid input(s): POCBNP No results for input(s): DDIMER in the last 72 hours. No results for input(s): HGBA1C in the last 72 hours. No results for input(s): CHOL, HDL, LDLCALC, TRIG, CHOLHDL, LDLDIRECT in the last 72 hours. No results for input(s): TSH, T4TOTAL, T3FREE, THYROIDAB in the last 72 hours.  Invalid input(s): FREET3 No results for input(s): VITAMINB12, FOLATE, FERRITIN, TIBC, IRON, RETICCTPCT in the last 72 hours. Coags: No results for input(s): INR in the last 72 hours.  Invalid input(s): PT Microbiology: Recent Results (from the past 240 hour(s))  SARS CORONAVIRUS 2 (TAT 6-24 HRS) Nasopharyngeal Nasopharyngeal Swab     Status: None   Collection Time: 06/02/19  6:23 AM   Specimen: Nasopharyngeal Swab  Result Value Ref Range Status   SARS Coronavirus 2 NEGATIVE NEGATIVE Final    Comment: (NOTE) SARS-CoV-2 target nucleic acids are NOT DETECTED. The SARS-CoV-2 RNA is generally detectable in upper and lower respiratory specimens during the acute phase of infection. Negative results do not preclude SARS-CoV-2 infection, do not rule out co-infections with other pathogens, and should not be used as the sole basis for treatment or other patient management decisions. Negative results must be combined with clinical observations, patient history, and epidemiological information. The expected result is Negative. Fact Sheet for Patients: SugarRoll.be Fact Sheet for Healthcare Providers: https://www.woods-mathews.com/ This test is not yet approved or cleared by the  [place] FDA and  has been authorized for detection and/or diagnosis of SARS-CoV-2 by FDA under an Emergency Use Authorization (EUA). This EUA will remain  in effect (meaning this test can be used) for the duration of the COVID-19 declaration under Section 56 4(b)(1) of the Act, 21 U.S.C. section 360bbb-3(b)(1), unless the authorization is terminated or revoked sooner. Performed at [facility], [place] [address], [place], [place] [address]     FURTHER DISCHARGE INSTRUCTIONS:  Get Medicines reviewed and adjusted: Please take all your medications with you for your next visit with your Primary MD  Laboratory/radiological data: Please request your Primary MD to go over all hospital tests and procedure/radiological results at the follow up, please ask your Primary MD  to get all Hospital records sent to his/her office.  In some cases, they will be blood work, cultures and biopsy results pending at the time of your discharge. Please request that your primary care M.D. goes through all the records of your hospital data and follows up on these results.  Also Note the following: If you experience worsening of your admission symptoms, develop shortness of breath, life threatening emergency, suicidal or homicidal thoughts you must seek medical attention immediately by calling 911 or calling your MD immediately  if symptoms less severe.  You must read complete instructions/literature along with all the possible adverse reactions/side effects for all the Medicines you take and that have been prescribed to you. Take any new Medicines after you have completely understood and accpet all the possible adverse reactions/side effects.   Do not drive when taking Pain medications or sleeping medications (Benzodaizepines)  Do not take more than prescribed Pain, Sleep and Anxiety Medications. It is not advisable to combine anxiety,sleep and pain medications without talking with your primary care  practitioner  Special Instructions: If you have smoked or chewed Tobacco  in the last 2 yrs please stop smoking, stop any regular Alcohol  and or any Recreational drug use.  Wear Seat belts while driving.  Please note: You were cared for by a hospitalist during your hospital stay. Once you are discharged, your primary care physician will handle any further medical issues. Please note that NO REFILLS for any discharge medications will be authorized once you are discharged, as it is imperative that you return to your primary care physician (or establish a relationship with a primary care physician if you do not have one) for your post hospital discharge needs so that they can reassess your need for medications and monitor your lab values.  Total Time spent coordinating discharge including counseling, education and face to face time equals 35 minutes.  SignedOren Binet 06/03/2019 9:03 AM

## 2019-06-03 NOTE — TOC Transition Note (Signed)
Transition of Care [facility]) - CM/SW Discharge Note   Patient Details  Name: Robert [name] MRN: [id number] Date of Birth: Nov 06, 1975  Transition of Care [facility]) CM/SW Contact:  Claudie Leach, RN 06/03/2019, 10:19 AM   Clinical Narrative:    Patient states he attends OP PT at a [place] office.  He will contact them Monday to arrange OPPT with MD office as referrals can only be sent to [facility].   Patient is in need of RW.  RW delivered to room from [place].   Final next level of care: OP Rehab Barriers to Discharge: No Barriers Identified    Discharge Plan and Services                DME Arranged: Walker rolling DME Agency: [employer] Date DME Agency Contacted: 06/03/19 Time DME Agency Contacted: 98 Representative spoke with at DME Agency: Edwyna Ready

## 2019-06-05 ENCOUNTER — Telehealth: Payer: Self-pay

## 2019-06-05 NOTE — Telephone Encounter (Signed)
Called and left message for patient to return call regarding recent admission.  Mychart message sent as well

## 2019-06-06 ENCOUNTER — Telehealth: Payer: Self-pay

## 2019-06-06 NOTE — Telephone Encounter (Signed)
Transition Care Management Follow-up Telephone Call  Date of discharge and from where: [facility]  How have you been since you were released from the hospital? Stable   Any questions or concerns? No   Items Reviewed:  Did the pt receive and understand the discharge instructions provided? Yes   Medications obtained and verified? Yes   Any new allergies since your discharge? No   Dietary orders reviewed? Yes  Do you have support at home? Yes   Other (ie: DME, Home Health, etc) n/a  Functional Questionnaire: (I = Independent and D = Dependent) ADL's: I  Bathing/Dressing- I   Meal Prep- I  Eating- I  Maintaining continence- I  Transferring/Ambulation- I  Managing Meds- I   Follow up appointments reviewed:    PCP Hospital f/u appt confirmed? Yes  Scheduled to see Dr. Yong Channel on 06/07/19 @ 920.  [facility] f/u appt confirmed? Patient to follow up with neurosurgeon- Dr. Vertell Limber    Are transportation arrangements needed? No   If their condition worsens, is the pt aware to call  their PCP or go to the ED? Yes  Was the patient provided with contact information for the PCP's office or ED? Yes  Was the pt encouraged to call back with questions or concerns? Yes

## 2019-06-06 NOTE — Telephone Encounter (Signed)
Noted thanks °

## 2019-06-07 ENCOUNTER — Ambulatory Visit (INDEPENDENT_AMBULATORY_CARE_PROVIDER_SITE_OTHER): Payer: Commercial Managed Care - PPO | Admitting: Family Medicine

## 2019-06-07 ENCOUNTER — Other Ambulatory Visit: Payer: Self-pay

## 2019-06-07 ENCOUNTER — Encounter: Payer: Self-pay | Admitting: Family Medicine

## 2019-06-07 VITALS — BP 150/90 | HR 112 | Temp 97.8°F | Ht 75.0 in | Wt 297.6 lb

## 2019-06-07 DIAGNOSIS — F325 Major depressive disorder, single episode, in full remission: Secondary | ICD-10-CM

## 2019-06-07 DIAGNOSIS — R030 Elevated blood-pressure reading, without diagnosis of hypertension: Secondary | ICD-10-CM | POA: Diagnosis not present

## 2019-06-07 DIAGNOSIS — F411 Generalized anxiety disorder: Secondary | ICD-10-CM

## 2019-06-07 DIAGNOSIS — E890 Postprocedural hypothyroidism: Secondary | ICD-10-CM | POA: Diagnosis not present

## 2019-06-07 DIAGNOSIS — D751 Secondary polycythemia: Secondary | ICD-10-CM | POA: Diagnosis not present

## 2019-06-07 DIAGNOSIS — M5441 Lumbago with sciatica, right side: Secondary | ICD-10-CM

## 2019-06-07 DIAGNOSIS — E669 Obesity, unspecified: Secondary | ICD-10-CM

## 2019-06-07 NOTE — Assessment & Plan Note (Addendum)
Patient with severe intractable low back pain after recent lumbar fusion on April 21, 2019.  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  Patient did well initially post hospitalization then on 1 day had some prolonged sitting in a hard chair while playing poker and had significant worsening of pain-ultimately had 2-day hospitalization-found Decadron very helpful and is now on outpatient course of this.  Pain down from 10 out of 10 to 2 out of 10 and patient doing much better-he will continue the Decadron until completion.  He is still using oxycodone 1-2 times a day.  He also has Valium available for muscle spasms as well as Flexeril.  Patient also has Ativan previously available for anxiety-we discussed that he cannot take Ativan within 6 hours of the oxycodone and should not be taking at all with taking the Valium.  We did note that the Decadron is likely elevating his blood pressure.  Patient agrees to do some home monitoring particularly after Decadron is out of his system and let me know if blood pressure is not trending back down-I do not think the benefit of stopping the Decadron outweighs the risks to his back pain.

## 2019-06-07 NOTE — Assessment & Plan Note (Signed)
Previously related to testosterone replacement at [place] has required phlebotomy in the past for this-I had recommended previously reduce dose of medicine.  Patient has not had another round of testosterone pellets recently and polycythemia resolved during hospitalization-he will see if he can remain off the testosterone after his surgery heals

## 2019-06-07 NOTE — Assessment & Plan Note (Signed)
We discussed the importance of maintaining weight/losing weight in the long run-may be more challenging while on Decadron-he is going to be intentional about trying to avoid overeating as he has had some increased cravings with Decadron-also feels much more jittery on Decadron but since pain is so much improved-we want to continue current regimen

## 2019-06-07 NOTE — Patient Instructions (Addendum)
Blood pressure cuff would be reasonable to use once you get off of decadron for a few weeks. Blood pressure slightly high today on the medicine but I really think you need it and I dont want to put you on blood pressure medicine yet. Goal to stay off blood pressure medicine is <140/90.   Thrilled you are feeling better in regards to the back !  No labs today as things looked pretty good before you left the hospital

## 2019-06-07 NOTE — Assessment & Plan Note (Signed)
PHQ-9 remains well controlled without medication at present-appears to be in full remission even without medicine.  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

## 2019-06-07 NOTE — Assessment & Plan Note (Signed)
Lorazepam as prescribed related to stress with mother in 2020-see discussion above but discussed how he must space this from oxycodone.  Thankfully recent stressors with mom have been somewhat better lately

## 2019-06-07 NOTE — Progress Notes (Signed)
Phone [phone]   Subjective:  Robert [name] is a 44 y.o. year old very pleasant male patient who presents for transitional care management and hospital follow up for lumbar back pain. Patient was hospitalized from 06/01/19 to 06/03/19. A TCM phone call was completed on 06/05/19. Medical complexity moderate  Patient was hospitalized from June 01, 2019 to June 03, 2019 for intractable lower back pain.  At baseline he is a 44 year old male with a history of hypothyroidism, ADHD, history of low back pain/radiculopathy ultimately requiring lumbar spinal fusion January 23.  MRI was completed while hospitalized showing stable postoperative changes.  Patient was started on IV narcotics and IV Decadron with substantial improvement.  It was thought his pain was related to inflammation of the operative site of recent spinal fusion.  With IV Decadron patient was able to get up and walk some labs with minimal pain.  He was discharged on a 10 to 14-day course of Decadron. He reflects back and trigger was prolonged sitting playing poker in a hard chair for 7 hours- tried to be intentional about getting up and moving but it just wasn't enough.  Patient also had CT renal stone study done to see if kidney stones could be source of pain but none were detected  Patient reports current level of pain control is 2/10 down from 10/10. Pain was so severe before he went in had to call EMS and they even gave 2 doses of fentanyl before he even got into the hospital.  He has a course of gabapentin to take as well as Percocet.  Patient knows not to take ativan along with oxycodone- knows now to separate by 6 hours- also on valium for muscle spasms but using sparing and knows not to mix with ativan- and using very sparingly anyway. He is on decadron full 2 week course- feels somewhat jittery on this and some early awakenings. He is taking 1-2 oxycodone a day along with flexeril. He also takes 900 mg gabapentin in Am but not sure  if its making a difference.   For patient's hypothyroidism he was continued on Armour Thyroid 120 mg Lab Results  Component Value Date   TSH 1.01 03/03/2019   For his ADHD-continued on medications from psychiatry Adderall extended release  Patient is also on testosterone replacement through [facility] integrative therapy. He does pellet therapy and is overdue- he is hoping perhaps after healing from nerve irritation that may not even need this.   Recent COVID-19 infection-patient had tested positive in the middle of February for COVID-19-she was asymptomatic at time of visit and had already passed this time of quarantine.  Patient had been treated with Pulmicort/budesonide and found this very helpful-removed from his medication list today is no longer taking   Also on tamsulosin for BPH  For obesity-BMI was noted to be elevated- warned of weight gain potential with decadron. He had gotten down to 288 on home scales before decadron but he has noted increased hunger.    Patient without history of hypertension but initial blood pressure was elevated today at 150/90  I independently reviewed the following 1.  MRI lumbar spine June 01, 2019-patient with postoperative changes from lumbar fusion at L5-S1.  Increased hyperintense density along S1 nerve root-disc herniation versus postoperative changes.  Patient also has shallow disc protrusion at L3-L4 that appears to have some contact with L3 nerve root.  Other than postsurgical changes and pre-existing spondylolysis -no bony abnormalities noted. 2.  CT renal stone study-no  obvious kidney stones as source of low back pain.  Diverticula noted without diverticulitis.  Postoperative changes of L5-S1 fusion noted.  Liver, pancreas, spleen, adrenal glands, urinary tract appear largely normal. 3.  CBC on June 02, 2019 was not noted to have no polycythemia with hemoglobin at 15.7 down from hemoglobin 18.5 one-month prior   See problem oriented charting as  well  Past Medical History-  Patient Active Problem List   Diagnosis Date Noted  . Low back pain 06/02/2019    Priority: High  . Spondylolysis, lumbar region 04/21/2019    Priority: High  . 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 Lumbar back pain with radiculopathy affecting right lower extremity 08/29/2016    Priority: High  . Hyperlipidemia, unspecified 10/22/2017    Priority: Medium  . Depression, major 09/27/2014    Priority: Medium  . 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 Anxiety state 01/11/2014    Priority: Medium  . Obesity (BMI 30-39.9) 01/11/2014    Priority: Medium  . ADHD (attention deficit hyperactivity disorder) 06/02/2011    Priority: Medium  . Major depressive disorder with single episode, in full remission ([place]) 06/07/2019    Priority: Low  . Cervical radiculopathy at C6 08/01/2018    Priority: Low  . Radial nerve entrapment, right 07/04/2018    Priority: Low  . Wart 02/27/2015    Priority: Low  . Volar plate injury of finger 01/09/2015    Priority: Low  . Rash 09/27/2014    Priority: Low  . Erectile dysfunction 09/27/2014    Priority: Low  . Vitamin D deficiency 07/23/2014    Priority: Low  . S/P partial thyroidectomy 02/28/2014    Priority: Low  . Cyst of mandible 12/22/2013    Priority: Low    Medications- reviewed and updated  A medical reconciliation was performed comparing current medicines to hospital discharge medications. Current Outpatient Medications  Medication Sig Dispense Refill  . amphetamine-dextroamphetamine (ADDERALL XR) 30 MG 24 hr capsule Take 30 mg by mouth daily.     Francia Greaves THYROID 120 MG tablet Take 120 mg by mouth daily before breakfast.     . Ascorbic Acid (VITAMIN C) 1000 MG tablet Take 1,000 mg by mouth daily.    . Cholecalciferol (VITAMIN D3 PO) Take 1 capsule by mouth daily.    . cyclobenzaprine (FLEXERIL) 10 MG tablet Take 1 tablet (10 mg total) by mouth 3 (three) times  daily as needed for muscle spasms. (Patient taking differently: Take 10 mg by mouth 3 (three) times daily. ) 30 tablet 1  . dexamethasone (DECADRON) 2 MG tablet Take 5 tabs (10 mg) for 2 days, then 4 tabs (8mg ) for 2 days, then 6 mg (3 tabs) for 2 days, then 4 mg (2tabs) for 2 days, then 2 mg (1 tab) for 2 days and then 1 mg (0.5 tabs) for 2 days and then stop 31 tablet 0  . diazepam (VALIUM) 2 MG tablet Take 1 tablet (2 mg total) by mouth every 4 (four) hours as needed for muscle spasms. 15 tablet 0  . gabapentin (NEURONTIN) 300 MG capsule One tab PO qHS for a week, then BID for a week, then TID. May double weekly to a max of 3,600mg /day (Patient taking differently: Take 900 mg by mouth daily. ) 180 capsule 3  . LORazepam (ATIVAN) 0.5 MG tablet Take 1 tablet (0.5 mg total) by mouth 2 (two) times daily as needed for anxiety. 60 tablet 0  .  Menaquinone-7 (VITAMIN K2 PO) Take 1 capsule by mouth daily.    Marland Kitchen oxyCODONE-acetaminophen (PERCOCET) 5-325 MG tablet Take 1 tablet by mouth every 4 (four) hours as needed for severe pain. (Patient taking differently: Take 1 tablet by mouth 4 (four) times daily as needed for severe pain. ) 20 tablet 0  . SUMAtriptan (IMITREX) 50 MG tablet Take 1 tablet (50 mg total) by mouth every 2 (two) hours as needed for migraine. May repeat in 2 hours if headache persists or recurs. 90 tablet 3  . tadalafil (CIALIS) 20 MG tablet Take 0.5-1 tablets (10-20 mg total) by mouth every other day as needed for erectile dysfunction. 5 tablet 2  . tamsulosin (FLOMAX) 0.4 MG CAPS capsule Take 0.4 mg by mouth daily.     No current facility-administered medications for this visit.   Objective  Objective:  BP (!) 150/90   Pulse (!) 112   Temp 97.8 F (36.6 C)   Ht 6\' 3"  (1.905 m)   Wt 297 lb 9.6 oz (135 kg)   SpO2 97%   BMI 37.20 kg/m  Gen: NAD, resting comfortably CV: Tachycardic but regular no murmurs rubs or gallops.  Patient typically with heart rate at least in the 80s-may also  be elevated by Decadron and pain Lungs: CTAB no crackles, wheeze, rhonchi Abdomen: soft/nontender/nondistended/normal bowel sounds.  Overweight Ext: no edema Skin: warm, dry Patient stands during the entirety of visit due to discomfort in his low back-wearing back brace   Assessment and Plan:   #Hospital follow-up/TCM  Low back pain Patient with severe intractable low back pain after recent lumbar fusion on April 21, 2019.  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  Patient did well initially post hospitalization then on 1 day had some prolonged sitting in a hard chair while playing poker and had significant worsening of pain-ultimately had 2-day hospitalization-found Decadron very helpful and is now on outpatient course of this.  Pain down from 10 out of 10 to 2 out of 10 and patient doing much better-he will continue the Decadron until completion.  He is still using oxycodone 1-2 times a day.  He also has Valium available for muscle spasms as well as Flexeril.  Patient also has Ativan previously available for anxiety-we discussed that he cannot take Ativan within 6 hours of the oxycodone and should not be taking at all with taking the Valium.  We did note that the Decadron is likely elevating his blood pressure.  Patient agrees to do some home monitoring particularly after Decadron is out of his system and let me know if blood pressure is not trending back down-I do not think the benefit of stopping the Decadron outweighs the risks to his back pain.  Secondary polycythemia Previously related to testosterone replacement at [place] has required phlebotomy in the past for this-I had recommended previously reduce dose of medicine.  Patient has not had another round of testosterone pellets recently and polycythemia resolved during hospitalization-he will see if he can remain off the testosterone after his  surgery heals  Post-surgical hypothyroidism Stable on Armour Thyroid through [facility] prefers this over synthetic-continue current medication Lab Results  Component Value Date   TSH 1.01 03/03/2019     Depression, major PHQ-9 remains well controlled without medication at present-appears to be in full remission even without medicine.  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  Thankfully recent stressors with mom have been somewhat better lately  Obesity (BMI 30-39.9) We discussed the importance of maintaining weight/losing weight in the long run-may be more challenging while on Decadron-he is going to be intentional about trying to avoid overeating as he has had some increased cravings with Decadron-also feels much more jittery on Decadron but since pain is so much improved-we want to continue current regimen   Recommended follow up: As needed for acute concerns Future Appointments  Date Time Provider Learned  06/08/2019 11:00 AM Georges Lynch None    Lab/Order associations:   ICD-10-CM   1. Major depressive disorder with single episode, in full remission ([place])  F32.5   2. Elevated blood pressure reading  R03.0   3. Acute low back pain with right-sided sciatica, unspecified back pain laterality  M54.41   4. Secondary polycythemia  D75.1   5. Post-surgical hypothyroidism  E89.0   6. Anxiety state  F41.1   7. Obesity (BMI 30-39.9)  E66.9     No orders of the defined types were placed in this encounter.   Return precautions advised.  Garret Reddish, MD

## 2019-06-07 NOTE — Assessment & Plan Note (Signed)
Stable on Armour Thyroid through [facility] integr[facility]-patient prefers this over synthetic-continue current medication Lab Results  Component Value Date   TSH 1.01 03/03/2019

## 2019-06-08 ENCOUNTER — Ambulatory Visit: Payer: Commercial Managed Care - PPO | Admitting: Psychology

## 2019-06-11 ENCOUNTER — Other Ambulatory Visit: Payer: Self-pay | Admitting: Family Medicine

## 2019-06-14 ENCOUNTER — Other Ambulatory Visit: Payer: Self-pay | Admitting: Family Medicine

## 2019-06-20 ENCOUNTER — Encounter: Payer: Self-pay | Admitting: Family Medicine

## 2019-06-20 ENCOUNTER — Ambulatory Visit (INDEPENDENT_AMBULATORY_CARE_PROVIDER_SITE_OTHER): Payer: Commercial Managed Care - PPO | Admitting: Family Medicine

## 2019-06-20 ENCOUNTER — Other Ambulatory Visit: Payer: Self-pay

## 2019-06-20 DIAGNOSIS — J329 Chronic sinusitis, unspecified: Secondary | ICD-10-CM

## 2019-06-20 DIAGNOSIS — B9689 Other specified bacterial agents as the cause of diseases classified elsewhere: Secondary | ICD-10-CM | POA: Diagnosis not present

## 2019-06-20 MED ORDER — AZITHROMYCIN 250 MG PO TABS: ORAL_TABLET | ORAL | 0 refills | Status: DC

## 2019-06-20 NOTE — Progress Notes (Signed)
Phone [phone] Virtual visit via Video note   Subjective:  Chief complaint: Chief Complaint  Patient presents with  . Nasal Congestion    x 1 week, yellow/green mucus, sore throat, denies fever and cough     This visit type was conducted due to national recommendations for restrictions regarding the COVID-19 Pandemic (e.g. social distancing).  This format is felt to be most appropriate for this patient at this time balancing risks to patient and risks to population by having him in for in person visit.  No physical exam was performed (except for noted visual exam or audio findings with Telehealth visits).    Our team/I connected with Robert [name] at  4:40 PM EDT by a video enabled telemedicine application (doxy.me or caregility through epic) and verified that I am speaking with the correct person using two identifiers.  Location patient: [facility] Location provider: [facility], office Persons participating in the virtual visit:  patient  Our team/I discussed the limitations of evaluation and management by telemedicine and the availability of in person appointments. In light of current covid-19 pandemic, patient also understands that we are trying to protect them by minimizing in office contact if at all possible.  The patient expressed consent for telemedicine visit and agreed to proceed. Patient understands insurance will be billed.   Past Medical History-  Patient Active Problem List   Diagnosis Date Noted  . Low back pain 06/02/2019    Priority: High  . Spondylolysis, lumbar region 04/21/2019    Priority: High  . 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 Lumbar back pain with radiculopathy affecting right lower extremity 08/29/2016    Priority: High  . Hyperlipidemia, unspecified 10/22/2017    Priority: Medium  . Depression, major 09/27/2014    Priority: Medium  . 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 Anxiety state 01/11/2014   Priority: Medium  . Obesity (BMI 30-39.9) 01/11/2014    Priority: Medium  . ADHD (attention deficit hyperactivity disorder) 06/02/2011    Priority: Medium  . Major depressive disorder with single episode, in full remission ([place]) 06/07/2019    Priority: Low  . Cervical radiculopathy at C6 08/01/2018    Priority: Low  . Radial nerve entrapment, right 07/04/2018    Priority: Low  . Wart 02/27/2015    Priority: Low  . Volar plate injury of finger 01/09/2015    Priority: Low  . Rash 09/27/2014    Priority: Low  . Erectile dysfunction 09/27/2014    Priority: Low  . Vitamin D deficiency 07/23/2014    Priority: Low  . S/P partial thyroidectomy 02/28/2014    Priority: Low  . Cyst of mandible 12/22/2013    Priority: Low    Medications- reviewed and updated Current Outpatient Medications  Medication Sig Dispense Refill  . amphetamine-dextroamphetamine (ADDERALL XR) 30 MG 24 hr capsule Take 30 mg by mouth daily.     Francia Greaves THYROID 120 MG tablet Take 120 mg by mouth daily before breakfast.     . Ascorbic Acid (VITAMIN C) 1000 MG tablet Take 1,000 mg by mouth daily.    . Cholecalciferol (VITAMIN D3 PO) Take 1 capsule by mouth daily.    . cyclobenzaprine (FLEXERIL) 10 MG tablet Take 1 tablet (10 mg total) by mouth 3 (three) times daily as needed for muscle spasms. (Patient taking differently: Take 10 mg by mouth 3 (three) times daily. ) 30 tablet 1  . gabapentin (NEURONTIN) 100 MG capsule TAKE 2  CAPSULES (200 MG TOTAL) BY MOUTH AT BEDTIME. 60 capsule 3  . gabapentin (NEURONTIN) 300 MG capsule One tab PO qHS for a week, then BID for a week, then TID. May double weekly to a max of 3,600mg /day (Patient taking differently: Take 900 mg by mouth daily. ) 180 capsule 3  . LORazepam (ATIVAN) 0.5 MG tablet Take 1 tablet (0.5 mg total) by mouth 2 (two) times daily as needed for anxiety. 60 tablet 0  . Menaquinone-7 (VITAMIN K2 PO) Take 1 capsule by mouth daily.    . SUMAtriptan (IMITREX) 50 MG tablet  Take 1 tablet (50 mg total) by mouth every 2 (two) hours as needed for migraine. May repeat in 2 hours if headache persists or recurs. 90 tablet 3  . tadalafil (CIALIS) 20 MG tablet Take 0.5-1 tablets (10-20 mg total) by mouth every other day as needed for erectile dysfunction. 5 tablet 2  . tamsulosin (FLOMAX) 0.4 MG CAPS capsule Take 0.4 mg by mouth daily.    Marland Kitchen azithromycin (ZITHROMAX) 250 MG tablet Take 2 tabs on day 1, then 1 tab daily until finished 6 tablet 0  . dexamethasone (DECADRON) 2 MG tablet Take 5 tabs (10 mg) for 2 days, then 4 tabs (8mg ) for 2 days, then 6 mg (3 tabs) for 2 days, then 4 mg (2tabs) for 2 days, then 2 mg (1 tab) for 2 days and then 1 mg (0.5 tabs) for 2 days and then stop (Patient not taking: Reported on 06/20/2019) 31 tablet 0  . diazepam (VALIUM) 2 MG tablet Take 1 tablet (2 mg total) by mouth every 4 (four) hours as needed for muscle spasms. (Patient not taking: Reported on 06/20/2019) 15 tablet 0  . oxyCODONE-acetaminophen (PERCOCET) 5-325 MG tablet Take 1 tablet by mouth every 4 (four) hours as needed for severe pain. (Patient not taking: Reported on 06/20/2019) 20 tablet 0  . PULMICORT FLEXHALER 90 MCG/ACT inhaler TAKE 2 PUFFS BY MOUTH TWICE A DAY (Patient not taking: Reported on 06/20/2019) 1 each 3   No current facility-administered medications for this visit.     Objective:  There were no vitals taken for this visit. self reported vitals Gen: NAD, resting comfortably Nasal sounding voice, points to maxillary sinuses as source of pain Lungs: nonlabored, normal respiratory rate  Skin: appears dry, no obvious rash     Assessment and Plan   # bacterial sinusitis S: patient's daughter was sick and treated with cefdinir- for ear infection.   He started with symptoms 8 days ago. Has yellow/green mucus as well as sore throat. No fever or cough. He has maxillary sinus pressure. He had some improvement in symptoms but they have worsened again. 4/10 in cheeks- like  constant pressure on cheekbones. No dental pain  Just recovered from covid so likelihood of that is low.  No chest pain or shortness of breath reported.   A/P: highly suspect bacteria sinusitis given 8 days of symptoms as well as double sickening.  We will treat with azithromycin-patient has had good success with this in the past for prior sinusitis.  I still recommend COVID-19 testing though patient was recently infected with COVID-19 and likelihood of reinfection is low-patient is well aware of how to get tested.  If he has new or worsening symptoms should let us know  Recommended follow up: As needed for new or worsening symptoms  Lab/Order associations:   ICD-10-CM   1. Bacterial sinusitis  J32.9    B96.89    Meds ordered  this encounter  Medications  . azithromycin (ZITHROMAX) 250 MG tablet    Sig: Take 2 tabs on day 1, then 1 tab daily until finished    Dispense:  6 tablet    Refill:  0   Return precautions advised.  Garret Reddish, MD

## 2019-06-20 NOTE — Patient Instructions (Signed)
There are no preventive care reminders to display for this patient.  Depression screen [facility] 2/9 06/20/2019 06/07/2019 05/23/2019  Decreased Interest 0 0 0  Down, Depressed, Hopeless 0 0 0  PHQ - 2 Score 0 0 0  Altered sleeping 0 0 0  Tired, decreased energy 0 0 0  Change in appetite 0 0 0  Feeling bad or failure about yourself  0 0 0  Trouble concentrating 0 0 0  Moving slowly or fidgety/restless 0 0 0  Suicidal thoughts 0 0 0  PHQ-9 Score 0 0 0  Difficult doing work/chores Not difficult at all Not difficult at all Not difficult at all  Some recent data might be hidden    Recommended follow up: No follow-ups on file.

## 2019-06-20 NOTE — Telephone Encounter (Signed)
Patient has been scheduled for a virtual visit with Dr. Yong Channel today at 4:40 pm. Norm Parcel has been sent.

## 2019-06-21 ENCOUNTER — Encounter: Payer: Self-pay | Admitting: Family Medicine

## 2019-06-22 ENCOUNTER — Other Ambulatory Visit: Payer: Self-pay

## 2019-06-22 ENCOUNTER — Ambulatory Visit (INDEPENDENT_AMBULATORY_CARE_PROVIDER_SITE_OTHER): Payer: Commercial Managed Care - PPO | Admitting: Physical Therapy

## 2019-06-22 ENCOUNTER — Encounter: Payer: Self-pay | Admitting: Physical Therapy

## 2019-06-22 DIAGNOSIS — M5440 Lumbago with sciatica, unspecified side: Secondary | ICD-10-CM | POA: Diagnosis not present

## 2019-06-25 ENCOUNTER — Encounter: Payer: Self-pay | Admitting: Physical Therapy

## 2019-06-25 NOTE — Therapy (Signed)
[facility] [address] [place], [place], [address] Phone: [phone]   Fax:  [phone]  Physical Therapy Evaluation  Patient Details  Name: Robert [name] MRN: [id number] Date of Birth: 03-13-1976 Referring Provider (PT): Oren Binet   Encounter Date: 06/22/2019  PT End of Session - 06/25/19 1321    Visit Number  1    Number of Visits  12    Date for PT Re-Evaluation  08/03/19    Authorization Type  UHC    PT Start Time  1217    PT Stop Time  1300    PT Time Calculation (min)  43 min    Equipment Utilized During Treatment  Back brace    Activity Tolerance  Patient limited by pain    Behavior During Therapy  [facility] for tasks assessed/performed       Past Medical History:  Diagnosis Date  . ADD (attention deficit disorder) without hyperactivity    according to his mother/ work test possible  . Anxiety   . Chronic sinusitis   . Depression   . Headache   . History of kidney stones    passed stone  . Hyperlipidemia    no meds, diet controlled  . Hypertension    recently dx 2 months ago, MD just monitoring, no meds at this time  . Hypothyroidism   . Sleep apnea    Uses CPAP nightly.    Past Surgical History:  Procedure Laterality Date  . HERNIA REPAIR     pt. was an infant when this surgery occured  . NASAL SINUS SURGERY    . THYROID SURGERY Right 02/28/2014   DR TEOH    PARTIAL THYROIDECTOMY  . THYROIDECTOMY Right 02/28/2014   Procedure: RIGHT HEMI THYROIDECTOMY;  Surgeon: Ascencion Dike, MD;  Location: [facility];  Service: ENT;  Laterality: Right;  . WISDOM TOOTH EXTRACTION      There were no vitals filed for this visit.   Subjective Assessment - 06/25/19 1312    Subjective  Pt was see previously in PT for back pain. Had back surgery (dr Vertell Limber)  04/21/19.  Felt he did well for about 2 weeks. Also had covid end of February. Started Having increased back pain and diffilcuty in March,  06/01/19 was in hospital 2 nights  for severe pain. Was unable to get up off table after massage. Recent imaging without significant findings. Pt wearing back brace, is working, Is having significant difficulty with all movment and functional activity. Pt states jolts of pain with even minor movements.    Limitations  Sitting;Lifting;Standing;Walking;House hold activities    Patient Stated Goals  Decreased pain, regain mobility and function    Currently in Pain?  Yes    Pain Score  6     Pain Location  Back    Pain Orientation  Right;Left;Lower    Pain Descriptors / Indicators  Aching    Pain Type  Acute pain    Pain Radiating Towards  Up to [id number] with certain motions.    Pain Onset  1 to 4 weeks ago    Pain Frequency  Intermittent    Aggravating Factors   movement, bending, transfers, most activity         [facility] PT Assessment - 06/25/19 0001      Assessment   Medical Diagnosis  Low Back Pain    Referring Provider (PT)  Shanker Ghimire    Onset Date/Surgical Date  04/21/19    Prior  Therapy  prior to surgery      Precautions   Precautions  Back      Balance Screen   Has the patient fallen in the past 6 months  No      Prior Function   Level of Independence  Independent      Cognition   Overall Cognitive Status  Within Functional Limits for tasks assessed      ROM / Strength   AROM / PROM / Strength  AROM;Strength      AROM   Overall AROM Comments  lumbar: not tested;  Hips: WFL., limited due to pain in back.     AROM Assessment Site  Lumbar    Lumbar Flexion  unable      Strength   Overall Strength Comments  Core: 3-/5, Hips: 3-/5 due to pain in back,  Likely 4/5 strength      Special Tests   Other special tests  n/a                Objective measurements completed on examination: See above findings.      [facility] Adult PT Treatment/Exercise - 06/25/19 0001      Exercises   Exercises  Lumbar      Lumbar Exercises: Supine   Ab Set  10 reps    Pelvic Tilt  10 reps    Clam  10 reps     Clam Limitations  Iso/ RTB     Other Supine Lumbar Exercises  Hip Add sq x20;              PT Education - 06/25/19 1320    Education Details  PT POC, Post surgical and safety precautions for back.    Person(s) Educated  Patient    Methods  Explanation;Demonstration;Tactile cues;Verbal cues    Comprehension  Verbalized understanding       PT Short Term Goals - 06/25/19 1325      PT SHORT TERM GOAL #1   Title  Pt to be independent with initial HEP    Time  2    Period  Weeks    Status  New    Target Date  07/06/19      PT SHORT TERM GOAL #2   Title  Pt to report pain in back/leg to 4/10    Time  2    Period  Weeks    Status  New    Target Date  07/06/19      PT SHORT TERM GOAL #3   Title  Pt to perform sit to stand transfer with proper mechanics, and no pain    Time  2    Period  Weeks    Status  New    Target Date  07/06/19        PT Long Term Goals - 06/25/19 1328      PT LONG TERM GOAL #1   Title  Pt to be independent with final HEP    Time  6    Period  Weeks    Status  New    Target Date  08/03/19      PT LONG TERM GOAL #2   Title  Pt to report decreased pain to 0-2/10 in back    Time  6    Period  Weeks    Status  New    Target Date  08/03/19      PT LONG TERM GOAL #3   Title  Pt to demo  ability for proper bend/squat/lift, without pain or difficulty, to improve ability for functional activity, child care, and work duties.    Time  6    Period  Weeks    Status  New    Target Date  08/03/19      PT LONG TERM GOAL #4   Title  Pt to report ability for ambulation, up to 20 min, without pain, to improve ability for community activity and work duties.    Time  6    Period  Weeks    Status  New    Target Date  08/03/19      PT LONG TERM GOAL #5   Title  Pt to demo ability for lumbar ROM  to be [facility] (with lumbar post op restrictions) and pain free, to improve ability for ADLs.    Time  6    Period  Weeks    Status  New    Target Date  08/03/19              Plan - 06/25/19 2132    Clinical Impression Statement  Pt presents wtih primary complaint of severe back pain and limitation, following L5/S1 fusion, 04/21/19 surgery. Pt with significant loss of lumbar ROM, and severe pain with movment and activity. Pt with poor ability for transfers, ambultion, bending, child care, ADLs. and IADLs, due to pain. He has poor core strength, and has been unable to perform HEP or exercise, due to severe pain. Pt to benefit from skilled PT to improve.    Personal Factors and Comorbidities  Past/Current Experience    Examination-Activity Limitations  Bathing;Locomotion Level;Transfers;Bed Mobility;Bend;Caring for Others;Sit;Sleep;Squat;Dressing;Stairs;Stand;Lift;Toileting    Examination-Participation Restrictions  Meal Prep;Cleaning;Community Activity;Driving;Shop;Laundry;Yard Work    [employer]  Evolving/Moderate complexity    Clinical Decision Making  Moderate    Rehab Potential  Good    PT Frequency  2x / week    PT Duration  6 weeks    PT Treatment/Interventions  ADLs/Self Care Home Management;Cryotherapy;Electrical Stimulation;Gait training;DME Instruction;Ultrasound;Traction;Moist Heat;Iontophoresis 4mg /ml Dexamethasone;Stair training;Functional mobility training;Therapeutic activities;Therapeutic exercise;Balance training;Neuromuscular re-education;Manual techniques;Orthotic Fit/Training;Patient/family education;Passive range of motion;Dry needling;Taping;Joint Manipulations;Spinal Manipulations;Vasopneumatic Device    Consulted and Agree with Plan of Care  Patient       Patient will benefit from skilled therapeutic intervention in order to improve the following deficits and impairments:  Abnormal gait, Decreased range of motion, Difficulty walking, Obesity, Increased muscle spasms, Decreased endurance, Decreased activity tolerance, Pain, Improper body mechanics, Impaired flexibility, Decreased mobility, Decreased  strength  Visit Diagnosis: Acute bilateral low back pain with sciatica, sciatica laterality unspecified     Problem List Patient Active Problem List   Diagnosis Date Noted  . Major depressive disorder with single episode, in full remission ([place]) 06/07/2019  . Low back pain 06/02/2019  . 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 Cervical radiculopathy at C6 08/01/2018  . Radial nerve entrapment, right 07/04/2018  . Hyperlipidemia, unspecified 10/22/2017  . Secondary polycythemia 09/09/2017  . Lumbar back pain with radiculopathy affecting right lower extremity 08/29/2016  . Wart 02/27/2015  . Volar plate injury of finger 01/09/2015  . Depression, major 09/27/2014  . Rash 09/27/2014  . Erectile dysfunction 09/27/2014  . Vitamin D deficiency 07/23/2014  . 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 S/P partial thyroidectomy 02/28/2014  . Anxiety state 01/11/2014  . Obesity (BMI 30-39.9) 01/11/2014  . Cyst of mandible 12/22/2013  . ADHD (attention deficit hyperactivity disorder) 06/02/2011   Lyndee Hensen, PT, DPT 9:45 PM  06/25/19  [facility] [address] [place], [place], [address] Phone: [phone]   Fax:  [phone]  Name: Robert [name] MRN: [id number] Date of Birth: 17-Feb-1976

## 2019-06-26 ENCOUNTER — Encounter: Payer: Self-pay | Admitting: Physical Therapy

## 2019-06-26 ENCOUNTER — Ambulatory Visit (INDEPENDENT_AMBULATORY_CARE_PROVIDER_SITE_OTHER): Payer: Commercial Managed Care - PPO | Admitting: Physical Therapy

## 2019-06-26 ENCOUNTER — Other Ambulatory Visit: Payer: Self-pay

## 2019-06-26 DIAGNOSIS — M5440 Lumbago with sciatica, unspecified side: Secondary | ICD-10-CM | POA: Diagnosis not present

## 2019-06-27 NOTE — Therapy (Signed)
[facility] [address] [place], [place], [address] Phone: [phone]   Fax:  [phone]  Physical Therapy Treatment  Patient Details  Name: Robert [name] MRN: [id number] Date of Birth: Mar 16, 1976 Referring Provider (PT): Oren Binet   Encounter Date: 06/26/2019  PT End of Session - 06/26/19 1544    Visit Number  2    Number of Visits  12    Date for PT Re-Evaluation  08/03/19    Authorization Type  UHC    PT Start Time  [id number]    PT Stop Time  1605    PT Time Calculation (min)  30 min    Equipment Utilized During Treatment  Back brace    Activity Tolerance  Patient limited by pain    Behavior During Therapy  [facility] for tasks assessed/performed       Past Medical History:  Diagnosis Date  . ADD (attention deficit disorder) without hyperactivity    according to his mother/ work test possible  . Anxiety   . Chronic sinusitis   . Depression   . Headache   . History of kidney stones    passed stone  . Hyperlipidemia    no meds, diet controlled  . Hypertension    recently dx 2 months ago, MD just monitoring, no meds at this time  . Hypothyroidism   . Sleep apnea    Uses CPAP nightly.    Past Surgical History:  Procedure Laterality Date  . HERNIA REPAIR     pt. was an infant when this surgery occured  . NASAL SINUS SURGERY    . THYROID SURGERY Right 02/28/2014   DR TEOH    PARTIAL THYROIDECTOMY  . THYROIDECTOMY Right 02/28/2014   Procedure: RIGHT HEMI THYROIDECTOMY;  Surgeon: Ascencion Dike, MD;  Location: [facility];  Service: ENT;  Laterality: Right;  . WISDOM TOOTH EXTRACTION      There were no vitals filed for this visit.  Subjective Assessment - 06/26/19 1542    Subjective  Pt states mild decrease in pain. He increased his flexeril dose, discussed clearing this with MD, states he has sent message. Pt 15 min late to appt    Currently in Pain?  Yes    Pain Score  4     Pain Location  Back    Pain Orientation   Right;Left;Lower    Pain Descriptors / Indicators  Aching    Pain Type  Acute pain    Pain Onset  1 to 4 weeks ago    Pain Frequency  Intermittent                       [facility] Adult PT Treatment/Exercise - 06/27/19 0001      Ambulation/Gait   Gait Comments  35 ft x 6 , practice for arm swing and posture.                PT Short Term Goals - 06/25/19 1325      PT SHORT TERM GOAL #1   Title  Pt to be independent with initial HEP    Time  2    Period  Weeks    Status  New    Target Date  07/06/19      PT SHORT TERM GOAL #2   Title  Pt to report pain in back/leg to 4/10    Time  2    Period  Weeks    Status  New    Target Date  07/06/19      PT SHORT TERM GOAL #3   Title  Pt to perform sit to stand transfer with proper mechanics, and no pain    Time  2    Period  Weeks    Status  New    Target Date  07/06/19        PT Long Term Goals - 06/25/19 1328      PT LONG TERM GOAL #1   Title  Pt to be independent with final HEP    Time  6    Period  Weeks    Status  New    Target Date  08/03/19      PT LONG TERM GOAL #2   Title  Pt to report decreased pain to 0-2/10 in back    Time  6    Period  Weeks    Status  New    Target Date  08/03/19      PT LONG TERM GOAL #3   Title  Pt to demo ability for proper bend/squat/lift, without pain or difficulty, to improve ability for functional activity, child care, and work duties.    Time  6    Period  Weeks    Status  New    Target Date  08/03/19      PT LONG TERM GOAL #4   Title  Pt to report ability for ambulation, up to 20 min, without pain, to improve ability for community activity and work duties.    Time  6    Period  Weeks    Status  New    Target Date  08/03/19      PT LONG TERM GOAL #5   Title  Pt to demo ability for lumbar ROM  to be [facility] (with lumbar post op restrictions) and pain free, to improve ability for ADLs.    Time  6    Period  Weeks    Status  New    Target Date  08/03/19             Plan - 06/27/19 0929    Clinical Impression Statement  Pt with improved ability for mobility today overall, but still has much difficulty with rolling over shifting hips for bed mobility. Pt with stiff posture with ambulation. Able to perform light ther ex without pain today.    Personal Factors and Comorbidities  Past/Current Experience    Examination-Activity Limitations  Bathing;Locomotion Level;Transfers;Bed Mobility;Bend;Caring for Others;Sit;Sleep;Squat;Dressing;Stairs;Stand;Lift;Toileting    Examination-Participation Restrictions  Meal Prep;Cleaning;Community Activity;Driving;Shop;Laundry;Yard Work    [employer] officer  Evolving/Moderate complexity    Rehab Potential  Good    PT Frequency  2x / week    PT Duration  6 weeks    PT Treatment/Interventions  ADLs/Self Care Home Management;Cryotherapy;Electrical Stimulation;Gait training;DME Instruction;Ultrasound;Traction;Moist Heat;Iontophoresis 4mg /ml Dexamethasone;Stair training;Functional mobility training;Therapeutic activities;Therapeutic exercise;Balance training;Neuromuscular re-education;Manual techniques;Orthotic Fit/Training;Patient/family education;Passive range of motion;Dry needling;Taping;Joint Manipulations;Spinal Manipulations;Vasopneumatic Device    Consulted and Agree with Plan of Care  Patient       Patient will benefit from skilled therapeutic intervention in order to improve the following deficits and impairments:  Abnormal gait, Decreased range of motion, Difficulty walking, Obesity, Increased muscle spasms, Decreased endurance, Decreased activity tolerance, Pain, Improper body mechanics, Impaired flexibility, Decreased mobility, Decreased strength  Visit Diagnosis: Acute bilateral low back pain with sciatica, sciatica laterality unspecified     Problem List Patient Active Problem List   Diagnosis Date Noted  .  Major depressive disorder with single episode, in full remission ([place])  06/07/2019  . Low back pain 06/02/2019  . 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 Cervical radiculopathy at C6 08/01/2018  . Radial nerve entrapment, right 07/04/2018  . Hyperlipidemia, unspecified 10/22/2017  . Secondary polycythemia 09/09/2017  . Lumbar back pain with radiculopathy affecting right lower extremity 08/29/2016  . Wart 02/27/2015  . Volar plate injury of finger 01/09/2015  . Depression, major 09/27/2014  . Rash 09/27/2014  . Erectile dysfunction 09/27/2014  . Vitamin D deficiency 07/23/2014  . 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 S/P partial thyroidectomy 02/28/2014  . Anxiety state 01/11/2014  . Obesity (BMI 30-39.9) 01/11/2014  . Cyst of mandible 12/22/2013  . ADHD (attention deficit hyperactivity disorder) 06/02/2011    Lyndee Hensen, PT, DPT 9:36 AM  06/27/19    [facility] [place], [place], [address] Phone: [phone]   Fax:  [phone]  Name: CHRISTAPHER WELSER MRN: [id number] Date of Birth: 04-18-1975

## 2019-06-28 ENCOUNTER — Encounter: Payer: Self-pay | Admitting: Family Medicine

## 2019-06-29 ENCOUNTER — Encounter: Payer: Self-pay | Admitting: Family Medicine

## 2019-06-29 ENCOUNTER — Encounter: Payer: Self-pay | Admitting: Physical Therapy

## 2019-06-29 ENCOUNTER — Other Ambulatory Visit: Payer: Self-pay

## 2019-06-29 ENCOUNTER — Ambulatory Visit (INDEPENDENT_AMBULATORY_CARE_PROVIDER_SITE_OTHER): Payer: Commercial Managed Care - PPO | Admitting: Family Medicine

## 2019-06-29 ENCOUNTER — Ambulatory Visit (INDEPENDENT_AMBULATORY_CARE_PROVIDER_SITE_OTHER): Payer: Commercial Managed Care - PPO | Admitting: Physical Therapy

## 2019-06-29 VITALS — BP 126/78 | HR 74 | Temp 96.4°F | Ht 75.0 in | Wt 305.0 lb

## 2019-06-29 DIAGNOSIS — L739 Follicular disorder, unspecified: Secondary | ICD-10-CM | POA: Diagnosis not present

## 2019-06-29 DIAGNOSIS — M5440 Lumbago with sciatica, unspecified side: Secondary | ICD-10-CM | POA: Diagnosis not present

## 2019-06-29 MED ORDER — CLINDAMYCIN PHOSPHATE 1 % EX SOLN: Freq: Two times a day (BID) | CUTANEOUS | 1 refills | Status: DC

## 2019-06-29 NOTE — Progress Notes (Signed)
Patient: Robert [name] MRN: [id number] DOB: 23-May-1975 PCP: Marin Olp, MD     Subjective:  Chief Complaint  Patient presents with  . Rash    Patient mentioned that he noticed a rash to both of his leg. He stated that the rash is on his left ankle with small red bumps that itch and he a rash on the front of his right leg that doesnt itch.    HPI: The patient is a 44 y.o. male who presents today for rash. Rash is on his bilateral anterior lower legs. Yesterday when he took his socks off he noticed the rash on his left leg on his sock line. It was itchy. He also has a rash on his arms that looks like pimples. He was on steroids in the hospital about 3 weeks ago. No hot tub or other water activities. No fever/chills, no bruising. No covid vaccine.   Review of Systems  Constitutional: Negative for chills, fatigue and fever.  Respiratory: Negative for cough and shortness of breath.   Cardiovascular: Negative for chest pain, palpitations and leg swelling.  Musculoskeletal: Positive for back pain. Negative for arthralgias.  Skin: Positive for rash.    Allergies Patient is allergic to penicillins; milk-related compounds; morphine and related; and topamax.  Past Medical History Patient  has a past medical history of ADD (attention deficit disorder) without hyperactivity, Anxiety, Chronic sinusitis, Depression, Headache, History of kidney stones, Hyperlipidemia, Hypertension, Hypothyroidism, and Sleep apnea.  Surgical History Patient  has a past surgical history that includes Nasal sinus surgery; Hernia repair; Thyroid surgery (Right, 02/28/2014); Thyroidectomy (Right, 02/28/2014); and Wisdom tooth extraction.  Family History Pateint's family history includes Hyperlipidemia in his mother; Hypertension in his mother.  Social History Patient  reports that he has never smoked. He has never used smokeless tobacco. He reports current alcohol use of about 1.0 - 5.0 standard drinks of  alcohol per week. He reports that he does not use drugs.    Objective: Vitals:   06/29/19 1020  BP: 126/78  Pulse: 74  Temp: (!) 96.4 F (35.8 C)  TempSrc: Temporal  SpO2: 96%  Weight: (!) 305 lb (138.3 kg)  Height: 6\' 3"  (1.905 m)    Body mass index is 38.12 kg/m.  Physical Exam Vitals reviewed.  Constitutional:      Appearance: Normal appearance. He is obese.  HENT:     Head: Normocephalic.  Skin:    Capillary Refill: Capillary refill takes less than 2 seconds.     Findings: Rash present.     Comments: Maculopapular rash on lower anterior legs, left ankle and bilateral arms. Some with pustules   Neurological:     General: No focal deficit present.     Mental Status: He is alert and oriented to person, place, and time.  Psychiatric:        Mood and Affect: Mood normal.        Behavior: Behavior normal.        Assessment/plan: 1. Folliculitis I think he has steroid folliculitis based off timeline and this will likely clear, but will give topical clindamycin for bacterial folliculitis at this time. Hygiene discussed. If resistant may need oral doxy and advised him to let us know.   This visit occurred during the SARS-CoV-2 public health emergency.  Safety protocols were in place, including screening questions prior to the visit, additional usage of staff PPE, and extensive cleaning of exam room while observing appropriate contact time as indicated for disinfecting solutions.  Return if symptoms worsen or fail to improve.     Orma Flaming, MD [place]  06/29/2019

## 2019-06-29 NOTE — Patient Instructions (Signed)
Likely secondary to steroids. Can have a steroid folliculitis.  I sent in a topical antibiotic to put on lesion twice a day for 7-10 days. If this doesn't work let me know and we will change to oral.   Nice to meet you! Dr. Rogers Blocker     Folliculitis  Folliculitis is inflammation of the hair follicles. Folliculitis most commonly occurs on the scalp, thighs, legs, back, and buttocks. However, it can occur anywhere on the body. What are the causes? This condition may be caused by:  A bacterial infection (common).  A fungal infection.  A viral infection.  Contact with certain chemicals, especially oils and tars.  Shaving or waxing.  Greasy ointments or creams applied to the skin. Long-lasting folliculitis and folliculitis that keeps coming back may be caused by bacteria. This bacteria can live anywhere on your skin and is often found in the nostrils. What increases the risk? You are more likely to develop this condition if you have:  A weakened immune system.  Diabetes.  Obesity. What are the signs or symptoms? Symptoms of this condition include:  Redness.  Soreness.  Swelling.  Itching.  Small white or yellow, pus-filled, itchy spots (pustules) that appear over a reddened area. If there is an infection that goes deep into the follicle, these may develop into a boil (furuncle).  A group of closely packed boils (carbuncle). These tend to form in hairy, sweaty areas of the body. How is this diagnosed? This condition is diagnosed with a skin exam. To find what is causing the condition, your health care provider may take a sample of one of the pustules or boils for testing in a lab. How is this treated? This condition may be treated by:  Applying warm compresses to the affected areas.  Taking an antibiotic medicine or applying an antibiotic medicine to the skin.  Applying or bathing with an antiseptic solution.  Taking an over-the-counter medicine to help with  itching.  Having a procedure to drain any pustules or boils. This may be done if a pustule or boil contains a lot of pus or fluid.  Having laser hair removal. This may be done to treat long-lasting folliculitis. Follow these instructions at home: Managing pain and swelling   If directed, apply heat to the affected area as often as told by your health care provider. Use the heat source that your health care provider recommends, such as a moist heat pack or a heating pad. ? Place a towel between your skin and the heat source. ? Leave the heat on for 20-30 minutes. ? Remove the heat if your skin turns bright red. This is especially important if you are unable to feel pain, heat, or cold. You may have a greater risk of getting burned. General instructions  If you were prescribed an antibiotic medicine, take it or apply it as told by your health care provider. Do not stop using the antibiotic even if your condition improves.  Check the irritated area every day for signs of infection. Check for: ? Redness, swelling, or pain. ? Fluid or blood. ? Warmth. ? Pus or a bad smell.  Do not shave irritated skin.  Take over-the-counter and prescription medicines only as told by your health care provider.  Keep all follow-up visits as told by your health care provider. This is important. Get help right away if:  You have more redness, swelling, or pain in the affected area.  Red streaks are spreading from the affected area.  You have a fever. Summary  Folliculitis is inflammation of the hair follicles. Folliculitis most commonly occurs on the scalp, thighs, legs, back, and buttocks.  This condition may be treated by taking an antibiotic medicine or applying an antibiotic medicine to the skin, and applying or bathing with an antiseptic solution.  If you were prescribed an antibiotic medicine, take it or apply it as told by your health care provider. Do not stop using the antibiotic even if  your condition improves.  Get help right away if you have new or worsening symptoms.  Keep all follow-up visits as told by your health care provider. This is important. This information is not intended to replace advice given to you by your health care provider. Make sure you discuss any questions you have with your health care provider. Document Revised: 10/23/2017 Document Reviewed: 10/23/2017 Elsevier Patient Education  [place].

## 2019-06-29 NOTE — Therapy (Signed)
[place], [place], [address] Phone: [phone]   Fax:  [phone]  Physical Therapy Treatment  Patient Details  Name: Robert [name] MRN: [id number] Date of Birth: Jun 14, 1975 Referring Provider (PT): Oren Binet   Encounter Date: 06/29/2019  PT End of Session - 06/29/19 1130    Visit Number  3    Number of Visits  12    Date for PT Re-Evaluation  08/03/19    Authorization Type  UHC    PT Start Time  0932    PT Stop Time  1013    PT Time Calculation (min)  41 min    Equipment Utilized During Treatment  Back brace    Activity Tolerance  Patient limited by pain    Behavior During Therapy  [facility] for tasks assessed/performed       Past Medical History:  Diagnosis Date  . ADD (attention deficit disorder) without hyperactivity    according to his mother/ work test possible  . Anxiety   . Chronic sinusitis   . Depression   . Headache   . History of kidney stones    passed stone  . Hyperlipidemia    no meds, diet controlled  . Hypertension    recently dx 2 months ago, MD just monitoring, no meds at this time  . Hypothyroidism   . Sleep apnea    Uses CPAP nightly.    Past Surgical History:  Procedure Laterality Date  . HERNIA REPAIR     pt. was an infant when this surgery occured  . NASAL SINUS SURGERY    . THYROID SURGERY Right 02/28/2014   DR TEOH    PARTIAL THYROIDECTOMY  . THYROIDECTOMY Right 02/28/2014   Procedure: RIGHT HEMI THYROIDECTOMY;  Surgeon: Ascencion Dike, MD;  Location: [facility];  Service: ENT;  Laterality: Right;  . WISDOM TOOTH EXTRACTION      There were no vitals filed for this visit.  Subjective Assessment - 06/29/19 1129    Subjective  Pt reports doing well past few days, moving a bit better. He traveled for work this week, and also is going to [place] on plane for a few days this weekend.    Currently in Pain?  Yes    Pain Score  4     Pain Location  Back    Pain Orientation   Right;Left    Pain Descriptors / Indicators  Aching    Pain Type  Acute pain    Pain Onset  1 to 4 weeks ago    Pain Frequency  Intermittent                       [facility] Adult PT Treatment/Exercise - 06/29/19 0001      Ambulation/Gait   Gait Comments  35 ft x 10;       Lumbar Exercises: Aerobic   Recumbent Bike  L1 x 4 min      Lumbar Exercises: Standing   Other Standing Lumbar Exercises  March, Hip abd x20 each bil;       Lumbar Exercises: Seated   Sit to Stand  5 reps      Lumbar Exercises: Supine   Ab Set  15 reps    AB Set Limitations  with breathing     Pelvic Tilt  10 reps    Clam  20 reps    Clam Limitations  GTB    Other Supine Lumbar Exercises  Hip Add sq x20;                PT Short Term Goals - 06/25/19 1325      PT SHORT TERM GOAL #1   Title  Pt to be independent with initial HEP    Time  2    Period  Weeks    Status  New    Target Date  07/06/19      PT SHORT TERM GOAL #2   Title  Pt to report pain in back/leg to 4/10    Time  2    Period  Weeks    Status  New    Target Date  07/06/19      PT SHORT TERM GOAL #3   Title  Pt to perform sit to stand transfer with proper mechanics, and no pain    Time  2    Period  Weeks    Status  New    Target Date  07/06/19        PT Long Term Goals - 06/25/19 1328      PT LONG TERM GOAL #1   Title  Pt to be independent with final HEP    Time  6    Period  Weeks    Status  New    Target Date  08/03/19      PT LONG TERM GOAL #2   Title  Pt to report decreased pain to 0-2/10 in back    Time  6    Period  Weeks    Status  New    Target Date  08/03/19      PT LONG TERM GOAL #3   Title  Pt to demo ability for proper bend/squat/lift, without pain or difficulty, to improve ability for functional activity, child care, and work duties.    Time  6    Period  Weeks    Status  New    Target Date  08/03/19      PT LONG TERM GOAL #4   Title  Pt to report ability for ambulation, up  to 20 min, without pain, to improve ability for community activity and work duties.    Time  6    Period  Weeks    Status  New    Target Date  08/03/19      PT LONG TERM GOAL #5   Title  Pt to demo ability for lumbar ROM  to be [facility] (with lumbar post op restrictions) and pain free, to improve ability for ADLs.    Time  6    Period  Weeks    Status  New    Target Date  08/03/19            Plan - 06/29/19 1131    Clinical Impression Statement  Pt with improved pain and ease of movment this week. He is still limited and very apprehensive to sit in regular height chair, due to difficulty and pain getting up and down. Struggles with bed mobility as well. Discussed continuing to limit a lot of activity, with his trip flying to [place] this weekend.    Personal Factors and Comorbidities  Past/Current Experience    Examination-Activity Limitations  Bathing;Locomotion Level;Transfers;Bed Mobility;Bend;Caring for Others;Sit;Sleep;Squat;Dressing;Stairs;Stand;Lift;Toileting    Examination-Participation Restrictions  Meal Prep;Cleaning;Community Activity;Driving;Shop;Laundry;Yard Work    Stability/Clinical Decision Making  Evolving/Moderate complexity    Rehab Potential  Good    PT Frequency  2x / week    PT  Duration  6 weeks    PT Treatment/Interventions  ADLs/Self Care Home Management;Cryotherapy;Electrical Stimulation;Gait training;DME Instruction;Ultrasound;Traction;Moist Heat;Iontophoresis 4mg /ml Dexamethasone;Stair training;Functional mobility training;Therapeutic activities;Therapeutic exercise;Balance training;Neuromuscular re-education;Manual techniques;Orthotic Fit/Training;Patient/family education;Passive range of motion;Dry needling;Taping;Joint Manipulations;Spinal Manipulations;Vasopneumatic Device    Consulted and Agree with Plan of Care  Patient       Patient will benefit from skilled therapeutic intervention in order to improve the following deficits and impairments:  Abnormal  gait, Decreased range of motion, Difficulty walking, Obesity, Increased muscle spasms, Decreased endurance, Decreased activity tolerance, Pain, Improper body mechanics, Impaired flexibility, Decreased mobility, Decreased strength  Visit Diagnosis: Acute bilateral low back pain with sciatica, sciatica laterality unspecified     Problem List Patient Active Problem List   Diagnosis Date Noted  . Major depressive disorder with single episode, in full remission ([place]) 06/07/2019  . Low back pain 06/02/2019  . 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 Cervical radiculopathy at C6 08/01/2018  . Radial nerve entrapment, right 07/04/2018  . Hyperlipidemia, unspecified 10/22/2017  . Secondary polycythemia 09/09/2017  . Lumbar back pain with radiculopathy affecting right lower extremity 08/29/2016  . Wart 02/27/2015  . Volar plate injury of finger 01/09/2015  . Depression, major 09/27/2014  . Rash 09/27/2014  . Erectile dysfunction 09/27/2014  . Vitamin D deficiency 07/23/2014  . 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 S/P partial thyroidectomy 02/28/2014  . Anxiety state 01/11/2014  . Obesity (BMI 30-39.9) 01/11/2014  . Cyst of mandible 12/22/2013  . ADHD (attention deficit hyperactivity disorder) 06/02/2011    Lyndee Hensen, PT, DPT 11:32 AM  06/29/19    [facility] [place], [place], [address] Phone: [phone]   Fax:  [phone]  Name: Robert [name] MRN: [id number] Date of Birth: [birth year]

## 2019-07-04 ENCOUNTER — Encounter: Payer: Self-pay | Admitting: Physical Therapy

## 2019-07-04 ENCOUNTER — Other Ambulatory Visit: Payer: Self-pay

## 2019-07-04 ENCOUNTER — Encounter: Payer: Commercial Managed Care - PPO | Admitting: Physical Therapy

## 2019-07-04 ENCOUNTER — Ambulatory Visit (INDEPENDENT_AMBULATORY_CARE_PROVIDER_SITE_OTHER): Payer: Commercial Managed Care - PPO | Admitting: Physical Therapy

## 2019-07-04 DIAGNOSIS — M5440 Lumbago with sciatica, unspecified side: Secondary | ICD-10-CM | POA: Diagnosis not present

## 2019-07-05 ENCOUNTER — Encounter: Payer: Self-pay | Admitting: Physical Therapy

## 2019-07-05 NOTE — Therapy (Signed)
[place], [place], [address] Phone: [phone]   Fax:  [phone]  Physical Therapy Treatment  Patient Details  Name: Robert [name] MRN: [id number] Date of Birth: [birth year] Referring Provider (PT): Oren Binet   Encounter Date: 07/04/2019  PT End of Session - 07/04/19 1524    Visit Number  4    Number of Visits  12    Date for PT Re-Evaluation  08/03/19    Authorization Type  UHC    PT Start Time  [id number]    PT Stop Time  1600    PT Time Calculation (min)  45 min    Equipment Utilized During Treatment  Back brace    Activity Tolerance  Patient limited by pain    Behavior During Therapy  [facility] for tasks assessed/performed       Past Medical History:  Diagnosis Date  . ADD (attention deficit disorder) without hyperactivity    according to his mother/ work test possible  . Anxiety   . Chronic sinusitis   . Depression   . Headache   . History of kidney stones    passed stone  . Hyperlipidemia    no meds, diet controlled  . Hypertension    recently dx 2 months ago, MD just monitoring, no meds at this time  . Hypothyroidism   . Sleep apnea    Uses CPAP nightly.    Past Surgical History:  Procedure Laterality Date  . HERNIA REPAIR     pt. was an infant when this surgery occured  . NASAL SINUS SURGERY    . THYROID SURGERY Right 02/28/2014   DR TEOH    PARTIAL THYROIDECTOMY  . THYROIDECTOMY Right 02/28/2014   Procedure: RIGHT HEMI THYROIDECTOMY;  Surgeon: Ascencion Dike, MD;  Location: [facility];  Service: ENT;  Laterality: Right;  . WISDOM TOOTH EXTRACTION      There were no vitals filed for this visit.  Subjective Assessment - 07/04/19 1523    Subjective  Pt states doing better, traveled over the weekend, did well.    Currently in Pain?  Yes    Pain Score  2     Pain Location  Back    Pain Orientation  Right;Left    Pain Descriptors / Indicators  Aching    Pain Type  Acute pain    Pain Onset   More than a month ago    Pain Frequency  Intermittent                       [facility] Adult PT Treatment/Exercise - 07/05/19 0001      Ambulation/Gait   Gait Comments  35 ft x 10;       Lumbar Exercises: Stretches   Single Knee to Chest Stretch  2 reps;30 seconds    Single Knee to Chest Stretch Limitations  bil, with towel assist  (light)       Lumbar Exercises: Aerobic   Recumbent Bike  L1 x 6 min      Lumbar Exercises: Standing   Row  20 reps    Theraband Level (Row)  Level 3 (Green)    Other Standing Lumbar Exercises  March, Hip abd x20 each bil;       Lumbar Exercises: Seated   Long Arc Quad on Chair  20 reps;Both    Sit to Stand  10 reps      Lumbar Exercises: Supine   Ab  Set  15 reps    AB Set Limitations  with breathing     Pelvic Tilt  10 reps    Clam  20 reps    Clam Limitations  GTB               PT Short Term Goals - 06/25/19 1325      PT SHORT TERM GOAL #1   Title  Pt to be independent with initial HEP    Time  2    Period  Weeks    Status  New    Target Date  07/06/19      PT SHORT TERM GOAL #2   Title  Pt to report pain in back/leg to 4/10    Time  2    Period  Weeks    Status  New    Target Date  07/06/19      PT SHORT TERM GOAL #3   Title  Pt to perform sit to stand transfer with proper mechanics, and no pain    Time  2    Period  Weeks    Status  New    Target Date  07/06/19        PT Long Term Goals - 06/25/19 1328      PT LONG TERM GOAL #1   Title  Pt to be independent with final HEP    Time  6    Period  Weeks    Status  New    Target Date  08/03/19      PT LONG TERM GOAL #2   Title  Pt to report decreased pain to 0-2/10 in back    Time  6    Period  Weeks    Status  New    Target Date  08/03/19      PT LONG TERM GOAL #3   Title  Pt to demo ability for proper bend/squat/lift, without pain or difficulty, to improve ability for functional activity, child care, and work duties.    Time  6    Period   Weeks    Status  New    Target Date  08/03/19      PT LONG TERM GOAL #4   Title  Pt to report ability for ambulation, up to 20 min, without pain, to improve ability for community activity and work duties.    Time  6    Period  Weeks    Status  New    Target Date  08/03/19      PT LONG TERM GOAL #5   Title  Pt to demo ability for lumbar ROM  to be [facility] (with lumbar post op restrictions) and pain free, to improve ability for ADLs.    Time  6    Period  Weeks    Status  New    Target Date  08/03/19            Plan - 07/05/19 0914    Clinical Impression Statement  Pt with improving mechanics and abilty for sit to stand transfer. Still has much diffiuclty with supine to sit, reports stiffness and pain with mobility after laying supine for even a short time. Pt with improved ability for LE movement today, with less pain in back. Pt still with signfiicant difficulty lifting  legs in bed when supine, or for exercise in supine, due to pressure/pain in back.    Personal Factors and Comorbidities  Past/Current Experience    Examination-Activity Limitations  Bathing;Locomotion Level;Transfers;Bed Mobility;Bend;Caring for Others;Sit;Sleep;Squat;Dressing;Stairs;Stand;Lift;Toileting    Examination-Participation Restrictions  Meal Prep;Cleaning;Community Activity;Driving;Shop;Laundry;Yard Work    [employer]  Evolving/Moderate complexity    Rehab Potential  Good    PT Frequency  2x / week    PT Duration  6 weeks    PT Treatment/Interventions  ADLs/Self Care Home Management;Cryotherapy;Electrical Stimulation;Gait training;DME Instruction;Ultrasound;Traction;Moist Heat;Iontophoresis 4mg /ml Dexamethasone;Stair training;Functional mobility training;Therapeutic activities;Therapeutic exercise;Balance training;Neuromuscular re-education;Manual techniques;Orthotic Fit/Training;Patient/family education;Passive range of motion;Dry needling;Taping;Joint Manipulations;Spinal  Manipulations;Vasopneumatic Device    Consulted and Agree with Plan of Care  Patient       Patient will benefit from skilled therapeutic intervention in order to improve the following deficits and impairments:  Abnormal gait, Decreased range of motion, Difficulty walking, Obesity, Increased muscle spasms, Decreased endurance, Decreased activity tolerance, Pain, Improper body mechanics, Impaired flexibility, Decreased mobility, Decreased strength  Visit Diagnosis: Acute bilateral low back pain with sciatica, sciatica laterality unspecified     Problem List Patient Active Problem List   Diagnosis Date Noted  . Major depressive disorder with single episode, in full remission ([place]) 06/07/2019  . Low back pain 06/02/2019  . 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 Cervical radiculopathy at C6 08/01/2018  . Radial nerve entrapment, right 07/04/2018  . Hyperlipidemia, unspecified 10/22/2017  . Secondary polycythemia 09/09/2017  . Lumbar back pain with radiculopathy affecting right lower extremity 08/29/2016  . Wart 02/27/2015  . Volar plate injury of finger 01/09/2015  . Depression, major 09/27/2014  . Rash 09/27/2014  . Erectile dysfunction 09/27/2014  . Vitamin D deficiency 07/23/2014  . 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 S/P partial thyroidectomy 02/28/2014  . Anxiety state 01/11/2014  . Obesity (BMI 30-39.9) 01/11/2014  . Cyst of mandible 12/22/2013  . ADHD (attention deficit hyperactivity disorder) 06/02/2011    Lyndee Hensen, PT, DPT 9:16 AM  07/05/19    [facility] [place], [place], [address] Phone: [phone]   Fax:  [phone]  Name: Robert [name] MRN: [id number] Date of Birth: 09-Feb-1976

## 2019-07-06 ENCOUNTER — Other Ambulatory Visit: Payer: Self-pay

## 2019-07-06 ENCOUNTER — Ambulatory Visit (INDEPENDENT_AMBULATORY_CARE_PROVIDER_SITE_OTHER): Payer: Commercial Managed Care - PPO | Admitting: Physical Therapy

## 2019-07-06 ENCOUNTER — Encounter: Payer: Self-pay | Admitting: Physical Therapy

## 2019-07-06 DIAGNOSIS — M5440 Lumbago with sciatica, unspecified side: Secondary | ICD-10-CM

## 2019-07-06 NOTE — Therapy (Signed)
[facility] [address] [place], [place], [address] Phone: [phone]   Fax:  [phone]  Physical Therapy Treatment  Patient Details  Name: Robert [name] MRN: [id number] Date of Birth: August 30, 1975 Referring Provider (PT): Oren Binet   Encounter Date: 07/06/2019  PT End of Session - 07/06/19 0940    Visit Number  5    Number of Visits  12    Date for PT Re-Evaluation  08/03/19    Authorization Type  UHC    PT Start Time  0932    PT Stop Time  1015    PT Time Calculation (min)  43 min    Equipment Utilized During Treatment  Back brace    Activity Tolerance  Patient limited by pain    Behavior During Therapy  [facility] for tasks assessed/performed       Past Medical History:  Diagnosis Date  . ADD (attention deficit disorder) without hyperactivity    according to his mother/ work test possible  . Anxiety   . Chronic sinusitis   . Depression   . Headache   . History of kidney stones    passed stone  . Hyperlipidemia    no meds, diet controlled  . Hypertension    recently dx 2 months ago, MD just monitoring, no meds at this time  . Hypothyroidism   . Sleep apnea    Uses CPAP nightly.    Past Surgical History:  Procedure Laterality Date  . HERNIA REPAIR     pt. was an infant when this surgery occured  . NASAL SINUS SURGERY    . THYROID SURGERY Right 02/28/2014   DR TEOH    PARTIAL THYROIDECTOMY  . THYROIDECTOMY Right 02/28/2014   Procedure: RIGHT HEMI THYROIDECTOMY;  Surgeon: Ascencion Dike, MD;  Location: [facility];  Service: ENT;  Laterality: Right;  . WISDOM TOOTH EXTRACTION      There were no vitals filed for this visit.  Subjective Assessment - 07/06/19 0938    Subjective  Pt still doing better, thinks new muscle relaxer is helping.    Currently in Pain?  Yes    Pain Score  2     Pain Location  Back    Pain Orientation  Right;Left    Pain Descriptors / Indicators  Aching    Pain Type  Acute pain    Pain Onset   More than a month ago    Pain Frequency  Intermittent                       [facility] Adult PT Treatment/Exercise - 07/06/19 0941      Ambulation/Gait   Gait Comments  35 ft x 10;       Lumbar Exercises: Stretches   Single Knee to Chest Stretch  2 reps;30 seconds    Single Knee to Chest Stretch Limitations  bil, with towel assist  (light)       Lumbar Exercises: Aerobic   Recumbent Bike  L2 x 6 min      Lumbar Exercises: Standing   Row  20 reps    Theraband Level (Row)  Level 3 (Green)    Other Standing Lumbar Exercises  March, Hip abd x20 each bil;     Other Standing Lumbar Exercises  Walk/March 10 ft x 4;       Lumbar Exercises: Seated   Long Arc Quad on Chair  20 reps;Both    LAQ on Chair  Weights (lbs)  limited ROM    Sit to Stand  10 reps    Other Seated Lumbar Exercises  practice for hip hinge (for transfers) x 10       Lumbar Exercises: Supine   Other Supine Lumbar Exercises  Supine heel slides and hip abd x20 with TA:                PT Short Term Goals - 07/06/19 0940      PT SHORT TERM GOAL #1   Title  Pt to be independent with initial HEP    Time  2    Period  Weeks    Status  Achieved    Target Date  07/06/19      PT SHORT TERM GOAL #2   Title  Pt to report pain in back/leg to 4/10    Time  2    Period  Weeks    Status  Achieved    Target Date  07/06/19      PT SHORT TERM GOAL #3   Title  Pt to perform sit to stand transfer with proper mechanics, and no pain    Time  2    Period  Weeks    Status  Partially Met    Target Date  07/06/19        PT Long Term Goals - 06/25/19 1328      PT LONG TERM GOAL #1   Title  Pt to be independent with final HEP    Time  6    Period  Weeks    Status  New    Target Date  08/03/19      PT LONG TERM GOAL #2   Title  Pt to report decreased pain to 0-2/10 in back    Time  6    Period  Weeks    Status  New    Target Date  08/03/19      PT LONG TERM GOAL #3   Title  Pt to demo ability  for proper bend/squat/lift, without pain or difficulty, to improve ability for functional activity, child care, and work duties.    Time  6    Period  Weeks    Status  New    Target Date  08/03/19      PT LONG TERM GOAL #4   Title  Pt to report ability for ambulation, up to 20 min, without pain, to improve ability for community activity and work duties.    Time  6    Period  Weeks    Status  New    Target Date  08/03/19      PT LONG TERM GOAL #5   Title  Pt to demo ability for lumbar ROM  to be [facility] (with lumbar post op restrictions) and pain free, to improve ability for ADLs.    Time  6    Period  Weeks    Status  New    Target Date  08/03/19            Plan - 07/06/19 1208    Clinical Impression Statement  Pt improving with ability for functional activity, and ability for ther ex. He is extremely limited with fwd bend, as he has been following his post op instructoins. Education and practice for hip hinge motion today, for mild bend with transfers, stairs, and to reach counter/sink. Pt will benefit from more practice with this. Pts pain levels much improved.  Personal Factors and Comorbidities  Past/Current Experience    Examination-Activity Limitations  Bathing;Locomotion Level;Transfers;Bed Mobility;Bend;Caring for Others;Sit;Sleep;Squat;Dressing;Stairs;Stand;Lift;Toileting    Examination-Participation Restrictions  Meal Prep;Cleaning;Community Activity;Driving;Shop;Laundry;Yard Work    [employer] officer  Evolving/Moderate complexity    Rehab Potential  Good    PT Frequency  2x / week    PT Duration  6 weeks    PT Treatment/Interventions  ADLs/Self Care Home Management;Cryotherapy;Electrical Stimulation;Gait training;DME Instruction;Ultrasound;Traction;Moist Heat;Iontophoresis 73m/ml Dexamethasone;Stair training;Functional mobility training;Therapeutic activities;Therapeutic exercise;Balance training;Neuromuscular re-education;Manual techniques;Orthotic  Fit/Training;Patient/family education;Passive range of motion;Dry needling;Taping;Joint Manipulations;Spinal Manipulations;Vasopneumatic Device    Consulted and Agree with Plan of Care  Patient       Patient will benefit from skilled therapeutic intervention in order to improve the following deficits and impairments:  Abnormal gait, Decreased range of motion, Difficulty walking, Obesity, Increased muscle spasms, Decreased endurance, Decreased activity tolerance, Pain, Improper body mechanics, Impaired flexibility, Decreased mobility, Decreased strength  Visit Diagnosis: Acute bilateral low back pain with sciatica, sciatica laterality unspecified     Problem List Patient Active Problem List   Diagnosis Date Noted  . Major depressive disorder with single episode, in full remission ([place] 06/07/2019  . Low back pain 06/02/2019  . 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 Cervical radiculopathy at C6 08/01/2018  . Radial nerve entrapment, right 07/04/2018  . Hyperlipidemia, unspecified 10/22/2017  . Secondary polycythemia 09/09/2017  . Lumbar back pain with radiculopathy affecting right lower extremity 08/29/2016  . Wart 02/27/2015  . Volar plate injury of finger 01/09/2015  . Depression, major 09/27/2014  . Rash 09/27/2014  . Erectile dysfunction 09/27/2014  . Vitamin D deficiency 07/23/2014  . 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 S/P partial thyroidectomy 02/28/2014  . Anxiety state 01/11/2014  . Obesity (BMI 30-39.9) 01/11/2014  . Cyst of mandible 12/22/2013  . ADHD (attention deficit hyperactivity disorder) 06/02/2011    LLyndee Hensen PT, DPT 12:10 PM  07/06/19    [place] [address]Phone: [phone]  Fax:  [phone] Name: Robert PRINTYMRN: [id number] of Birth: 1June 29, 1977

## 2019-07-10 ENCOUNTER — Ambulatory Visit (INDEPENDENT_AMBULATORY_CARE_PROVIDER_SITE_OTHER): Payer: Commercial Managed Care - PPO | Admitting: Physical Therapy

## 2019-07-10 ENCOUNTER — Encounter: Payer: Self-pay | Admitting: Physical Therapy

## 2019-07-10 ENCOUNTER — Other Ambulatory Visit: Payer: Self-pay

## 2019-07-10 DIAGNOSIS — M5440 Lumbago with sciatica, unspecified side: Secondary | ICD-10-CM | POA: Diagnosis not present

## 2019-07-10 NOTE — Therapy (Signed)
[facility] [address] [place], [place], [address] Phone: [phone]   Fax:  [phone]  Physical Therapy Treatment  Patient Details  Name: Robert [name] MRN: [id number] Date of Birth: [birth year] Referring Provider (PT): Oren Binet   Encounter Date: 07/10/2019  PT End of Session - 07/10/19 0942    Visit Number  6    Number of Visits  12    Date for PT Re-Evaluation  08/03/19    Authorization Type  UHC    PT Start Time  0935    PT Stop Time  1015    PT Time Calculation (min)  40 min    Equipment Utilized During Treatment  Back brace    Activity Tolerance  Patient limited by pain    Behavior During Therapy  [facility] for tasks assessed/performed       Past Medical History:  Diagnosis Date  . ADD (attention deficit disorder) without hyperactivity    according to his mother/ work test possible  . Anxiety   . Chronic sinusitis   . Depression   . Headache   . History of kidney stones    passed stone  . Hyperlipidemia    no meds, diet controlled  . Hypertension    recently dx 2 months ago, MD just monitoring, no meds at this time  . Hypothyroidism   . Sleep apnea    Uses CPAP nightly.    Past Surgical History:  Procedure Laterality Date  . HERNIA REPAIR     pt. was an infant when this surgery occured  . NASAL SINUS SURGERY    . THYROID SURGERY Right 02/28/2014   DR TEOH    PARTIAL THYROIDECTOMY  . THYROIDECTOMY Right 02/28/2014   Procedure: RIGHT HEMI THYROIDECTOMY;  Surgeon: Ascencion Dike, MD;  Location: [facility];  Service: ENT;  Laterality: Right;  . WISDOM TOOTH EXTRACTION      There were no vitals filed for this visit.  Subjective Assessment - 07/10/19 0941    Subjective  Pt states less pain overall in back. R thigh/quad has been burning with standing/working for about 30 min, also feels tightness in R quad muscle.    Currently in Pain?  Yes    Pain Score  2     Pain Location  Back    Pain Orientation   Right;Left    Pain Descriptors / Indicators  Aching    Pain Type  Acute pain    Pain Onset  More than a month ago    Pain Frequency  Intermittent                       [facility] Adult PT Treatment/Exercise - 07/10/19 0943      Ambulation/Gait   Gait Comments  --      Lumbar Exercises: Stretches   Single Knee to Chest Stretch  --    Single Knee to Chest Stretch Limitations  --    Other Lumbar Stretch Exercise  Standing R hip flexor stretch 30 sec x 3;  Standing R quad stretch 30 sec x2;     Other Lumbar Stretch Exercise  Supine R hip flexor stretch (back brace on , therapist assist ) x2 min ;       Lumbar Exercises: Aerobic   Recumbent Bike  L2 x 8 min      Lumbar Exercises: Standing   Row  20 reps    Theraband Level (Row)  Level 3 (Green)  Other Standing Lumbar Exercises  March, Hip abd x20 each bil;       Lumbar Exercises: Seated   Long Arc Quad on Chair  --    LAQ on Chair Weights (lbs)  --    Sit to Stand  --    Other Seated Lumbar Exercises  --      Lumbar Exercises: Supine   Other Supine Lumbar Exercises  --      Manual Therapy   Manual therapy comments  skilled palpation and monitoring of soft tissue with dry needling.     Soft tissue mobilization  DTM/IASTM to R quad       Trigger Point Dry Needling - 07/10/19 0001    Consent Given?  Yes    Education Handout Provided  Yes    Muscles Treated Lower Quadrant  Rectus femoris;Vastus lateralis    Rectus femoris Response  Twitch response elicited;Palpable increased muscle length   R   Vastus lateralis Response  Twitch response elicited;Palpable increased muscle length   R            PT Short Term Goals - 07/06/19 0940      PT SHORT TERM GOAL #1   Title  Pt to be independent with initial HEP    Time  2    Period  Weeks    Status  Achieved    Target Date  07/06/19      PT SHORT TERM GOAL #2   Title  Pt to report pain in back/leg to 4/10    Time  2    Period  Weeks    Status  Achieved     Target Date  07/06/19      PT SHORT TERM GOAL #3   Title  Pt to perform sit to stand transfer with proper mechanics, and no pain    Time  2    Period  Weeks    Status  Partially Met    Target Date  07/06/19        PT Long Term Goals - 06/25/19 1328      PT LONG TERM GOAL #1   Title  Pt to be independent with final HEP    Time  6    Period  Weeks    Status  New    Target Date  08/03/19      PT LONG TERM GOAL #2   Title  Pt to report decreased pain to 0-2/10 in back    Time  6    Period  Weeks    Status  New    Target Date  08/03/19      PT LONG TERM GOAL #3   Title  Pt to demo ability for proper bend/squat/lift, without pain or difficulty, to improve ability for functional activity, child care, and work duties.    Time  6    Period  Weeks    Status  New    Target Date  08/03/19      PT LONG TERM GOAL #4   Title  Pt to report ability for ambulation, up to 20 min, without pain, to improve ability for community activity and work duties.    Time  6    Period  Weeks    Status  New    Target Date  08/03/19      PT LONG TERM GOAL #5   Title  Pt to demo ability for lumbar ROM  to be [facility] (with lumbar post op  restrictions) and pain free, to improve ability for ADLs.    Time  6    Period  Weeks    Status  New    Target Date  08/03/19            Plan - 07/10/19 1141    Clinical Impression Statement  Pt with good tolerance for dry needling today, good twitch response in R quad. WIll assess effects next visit. Discussed sitting at home, to relieve pain in R thigh, could be stemming from his back. Plan to progress as tolerated.    Personal Factors and Comorbidities  Past/Current Experience    Examination-Activity Limitations  Bathing;Locomotion Level;Transfers;Bed Mobility;Bend;Caring for Others;Sit;Sleep;Squat;Dressing;Stairs;Stand;Lift;Toileting    Examination-Participation Restrictions  Meal Prep;Cleaning;Community Activity;Driving;Shop;Laundry;Yard Work     [employer] officer  Evolving/Moderate complexity    Rehab Potential  Good    PT Frequency  2x / week    PT Duration  6 weeks    PT Treatment/Interventions  ADLs/Self Care Home Management;Cryotherapy;Electrical Stimulation;Gait training;DME Instruction;Ultrasound;Traction;Moist Heat;Iontophoresis 72m/ml Dexamethasone;Stair training;Functional mobility training;Therapeutic activities;Therapeutic exercise;Balance training;Neuromuscular re-education;Manual techniques;Orthotic Fit/Training;Patient/family education;Passive range of motion;Dry needling;Taping;Joint Manipulations;Spinal Manipulations;Vasopneumatic Device    Consulted and Agree with Plan of Care  Patient       Patient will benefit from skilled therapeutic intervention in order to improve the following deficits and impairments:  Abnormal gait, Decreased range of motion, Difficulty walking, Obesity, Increased muscle spasms, Decreased endurance, Decreased activity tolerance, Pain, Improper body mechanics, Impaired flexibility, Decreased mobility, Decreased strength  Visit Diagnosis: Acute bilateral low back pain with sciatica, sciatica laterality unspecified     Problem List Patient Active Problem List   Diagnosis Date Noted  . Major depressive disorder with single episode, in full remission ([facility] 06/07/2019  . Low back pain 06/02/2019  . 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 Cervical radiculopathy at C6 08/01/2018  . Radial nerve entrapment, right 07/04/2018  . Hyperlipidemia, unspecified 10/22/2017  . Secondary polycythemia 09/09/2017  . Lumbar back pain with radiculopathy affecting right lower extremity 08/29/2016  . Wart 02/27/2015  . Volar plate injury of finger 01/09/2015  . Depression, major 09/27/2014  . Rash 09/27/2014  . Erectile dysfunction 09/27/2014  . Vitamin D deficiency 07/23/2014  . 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 S/P partial thyroidectomy 02/28/2014  . Anxiety state 01/11/2014   . Obesity (BMI 30-39.9) 01/11/2014  . Cyst of mandible 12/22/2013  . ADHD (attention deficit hyperactivity disorder) 06/02/2011    LLyndee Hensen PT, DPT 11:42 AM  07/10/19    [facility] [address]Phone: [phone]  Fax:  [phone] Name: FNICOLAS BANHMRN: [id number] of Birth: 105/09/1975

## 2019-07-13 ENCOUNTER — Encounter: Payer: Self-pay | Admitting: Physical Therapy

## 2019-07-13 ENCOUNTER — Other Ambulatory Visit: Payer: Self-pay

## 2019-07-13 ENCOUNTER — Ambulatory Visit (INDEPENDENT_AMBULATORY_CARE_PROVIDER_SITE_OTHER): Payer: Commercial Managed Care - PPO | Admitting: Physical Therapy

## 2019-07-13 DIAGNOSIS — M5440 Lumbago with sciatica, unspecified side: Secondary | ICD-10-CM

## 2019-07-13 NOTE — Therapy (Signed)
[facility] [address] [place], [place], [address] Phone: [phone]   Fax:  [phone]  Physical Therapy Treatment  Patient Details  Name: Robert [name] MRN: [id number] Date of Birth: 08-Oct-1975 Referring Provider (PT): Oren Binet   Encounter Date: 07/13/2019  PT End of Session - 07/13/19 1607    Visit Number  7    Number of Visits  12    Date for PT Re-Evaluation  08/03/19    Authorization Type  UHC    PT Start Time  5859    PT Stop Time  1647    PT Time Calculation (min)  49 min    Equipment Utilized During Treatment  Back brace    Activity Tolerance  Patient limited by pain    Behavior During Therapy  [facility] for tasks assessed/performed       Past Medical History:  Diagnosis Date  . ADD (attention deficit disorder) without hyperactivity    according to his mother/ work test possible  . Anxiety   . Chronic sinusitis   . Depression   . Headache   . History of kidney stones    passed stone  . Hyperlipidemia    no meds, diet controlled  . Hypertension    recently dx 2 months ago, MD just monitoring, no meds at this time  . Hypothyroidism   . Sleep apnea    Uses CPAP nightly.    Past Surgical History:  Procedure Laterality Date  . HERNIA REPAIR     pt. was an infant when this surgery occured  . NASAL SINUS SURGERY    . THYROID SURGERY Right 02/28/2014   DR TEOH    PARTIAL THYROIDECTOMY  . THYROIDECTOMY Right 02/28/2014   Procedure: RIGHT HEMI THYROIDECTOMY;  Surgeon: Ascencion Dike, MD;  Location: [facility];  Service: ENT;  Laterality: Right;  . WISDOM TOOTH EXTRACTION      There were no vitals filed for this visit.  Subjective Assessment - 07/13/19 1606    Subjective  Pt states much less pain in thigh since last visit. Still has mild numbness in thigh with standing, but less pain. Back pain minimal.    Currently in Pain?  Yes    Pain Score  2     Pain Location  Back    Pain Orientation  Right;Left    Pain  Descriptors / Indicators  Aching    Pain Type  Acute pain    Pain Onset  More than a month ago    Pain Frequency  Intermittent                       [facility] Adult PT Treatment/Exercise - 07/13/19 1608      Lumbar Exercises: Stretches   Active Hamstring Stretch  2 reps;30 seconds    Active Hamstring Stretch Limitations  seated/light     Single Knee to Chest Stretch  2 reps;30 seconds    Piriformis Stretch  2 reps;30 seconds    Piriformis Stretch Limitations  supine    Other Lumbar Stretch Exercise  Standing R hip flexor stretch 30 sec x 3;  Standing R quad stretch 30 sec x2;       Lumbar Exercises: Aerobic   Recumbent Bike  L3 x 8 min      Lumbar Exercises: Standing   Functional Squats  20 reps    Row  20 reps    Theraband Level (Row)  Level 3 (Green)  Other Standing Lumbar Exercises  March, Hip abd x20 each bil;     Other Standing Lumbar Exercises  Practice for fwd hip hinge, reach x10;  TA with UE flex/ext RTB :       Lumbar Exercises: Seated   Long Arc Quad on Chair  20 reps      Lumbar Exercises: Supine   Ab Set  10 reps    Bridge  15 reps      Lumbar Exercises: Sidelying   Hip Abduction  20 reps      Manual Therapy   Passive ROM  Manual R HS stretch with ankle pump x15;                PT Short Term Goals - 07/06/19 0940      PT SHORT TERM GOAL #1   Title  Pt to be independent with initial HEP    Time  2    Period  Weeks    Status  Achieved    Target Date  07/06/19      PT SHORT TERM GOAL #2   Title  Pt to report pain in back/leg to 4/10    Time  2    Period  Weeks    Status  Achieved    Target Date  07/06/19      PT SHORT TERM GOAL #3   Title  Pt to perform sit to stand transfer with proper mechanics, and no pain    Time  2    Period  Weeks    Status  Partially Met    Target Date  07/06/19        PT Long Term Goals - 06/25/19 1328      PT LONG TERM GOAL #1   Title  Pt to be independent with final HEP    Time  6     Period  Weeks    Status  New    Target Date  08/03/19      PT LONG TERM GOAL #2   Title  Pt to report decreased pain to 0-2/10 in back    Time  6    Period  Weeks    Status  New    Target Date  08/03/19      PT LONG TERM GOAL #3   Title  Pt to demo ability for proper bend/squat/lift, without pain or difficulty, to improve ability for functional activity, child care, and work duties.    Time  6    Period  Weeks    Status  New    Target Date  08/03/19      PT LONG TERM GOAL #4   Title  Pt to report ability for ambulation, up to 20 min, without pain, to improve ability for community activity and work duties.    Time  6    Period  Weeks    Status  New    Target Date  08/03/19      PT LONG TERM GOAL #5   Title  Pt to demo ability for lumbar ROM  to be [facility] (with lumbar post op restrictions) and pain free, to improve ability for ADLs.    Time  6    Period  Weeks    Status  New    Target Date  08/03/19            Plan - 07/13/19 1648    Clinical Impression Statement  Pt with improved ability for functional reach,  bend/hip hinge today, with no pain. Pt also with improved ability for bridge with exercise and bed mobility. Pt still very stiff after laying supine for a few min. Pt progressing well this week.    Personal Factors and Comorbidities  Past/Current Experience    Examination-Activity Limitations  Bathing;Locomotion Level;Transfers;Bed Mobility;Bend;Caring for Others;Sit;Sleep;Squat;Dressing;Stairs;Stand;Lift;Toileting    Examination-Participation Restrictions  Meal Prep;Cleaning;Community Activity;Driving;Shop;Laundry;Yard Work    [employer] officer  Evolving/Moderate complexity    Rehab Potential  Good    PT Frequency  2x / week    PT Duration  6 weeks    PT Treatment/Interventions  ADLs/Self Care Home Management;Cryotherapy;Electrical Stimulation;Gait training;DME Instruction;Ultrasound;Traction;Moist Heat;Iontophoresis 55m/ml Dexamethasone;Stair  training;Functional mobility training;Therapeutic activities;Therapeutic exercise;Balance training;Neuromuscular re-education;Manual techniques;Orthotic Fit/Training;Patient/family education;Passive range of motion;Dry needling;Taping;Joint Manipulations;Spinal Manipulations;Vasopneumatic Device    Consulted and Agree with Plan of Care  Patient       Patient will benefit from skilled therapeutic intervention in order to improve the following deficits and impairments:  Abnormal gait, Decreased range of motion, Difficulty walking, Obesity, Increased muscle spasms, Decreased endurance, Decreased activity tolerance, Pain, Improper body mechanics, Impaired flexibility, Decreased mobility, Decreased strength  Visit Diagnosis: Acute bilateral low back pain with sciatica, sciatica laterality unspecified     Problem List Patient Active Problem List   Diagnosis Date Noted  . Major depressive disorder with single episode, in full remission ([facility] 06/07/2019  . Low back pain 06/02/2019  . 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 Cervical radiculopathy at C6 08/01/2018  . Radial nerve entrapment, right 07/04/2018  . Hyperlipidemia, unspecified 10/22/2017  . Secondary polycythemia 09/09/2017  . Lumbar back pain with radiculopathy affecting right lower extremity 08/29/2016  . Wart 02/27/2015  . Volar plate injury of finger 01/09/2015  . Depression, major 09/27/2014  . Rash 09/27/2014  . Erectile dysfunction 09/27/2014  . Vitamin D deficiency 07/23/2014  . 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 S/P partial thyroidectomy 02/28/2014  . Anxiety state 01/11/2014  . Obesity (BMI 30-39.9) 01/11/2014  . Cyst of mandible 12/22/2013  . ADHD (attention deficit hyperactivity disorder) 06/02/2011    LLyndee Hensen PT, DPT 4:50 PM  07/13/19    [place] [address]Phone: [phone]  Fax:  [phone] Name:  Robert LUNDEMRN: [id number] of Birth: 1October 31, 1977

## 2019-07-18 NOTE — Addendum Note (Signed)
Addended by: Lyndee Hensen on: 07/18/2019 03:36 PM   Modules accepted: Orders

## 2019-07-24 ENCOUNTER — Encounter: Payer: Commercial Managed Care - PPO | Admitting: Physical Therapy

## 2019-07-27 ENCOUNTER — Ambulatory Visit (INDEPENDENT_AMBULATORY_CARE_PROVIDER_SITE_OTHER): Payer: Commercial Managed Care - PPO | Admitting: Physical Therapy

## 2019-07-27 ENCOUNTER — Encounter: Payer: Self-pay | Admitting: Physical Therapy

## 2019-07-27 ENCOUNTER — Other Ambulatory Visit: Payer: Self-pay

## 2019-07-27 DIAGNOSIS — M5440 Lumbago with sciatica, unspecified side: Secondary | ICD-10-CM | POA: Diagnosis not present

## 2019-07-27 NOTE — Therapy (Signed)
[facility] [address] [place], [place], [address] Phone: [phone]   Fax:  [phone]  Physical Therapy Treatment  Patient Details  Name: Robert [name] MRN: [id number] Date of Birth: [birth year] Referring Provider (PT): Shanker Ghimire/ Garret Reddish   Encounter Date: 07/27/2019  PT End of Session - 07/27/19 1222    Visit Number  8    Number of Visits  12    Date for PT Re-Evaluation  08/03/19    Authorization Type  [employer]    PT Start Time  1216    PT Stop Time  1303    PT Time Calculation (min)  47 min    Equipment Utilized During Treatment  Back brace    Activity Tolerance  Patient limited by pain    Behavior During Therapy  [facility] for tasks assessed/performed       Past Medical History:  Diagnosis Date  . ADD (attention deficit disorder) without hyperactivity    according to his mother/ work test possible  . Anxiety   . Chronic sinusitis   . Depression   . Headache   . History of kidney stones    passed stone  . Hyperlipidemia    no meds, diet controlled  . Hypertension    recently dx 2 months ago, MD just monitoring, no meds at this time  . Hypothyroidism   . Sleep apnea    Uses CPAP nightly.    Past Surgical History:  Procedure Laterality Date  . HERNIA REPAIR     pt. was an infant when this surgery occured  . NASAL SINUS SURGERY    . THYROID SURGERY Right 02/28/2014   DR TEOH    PARTIAL THYROIDECTOMY  . THYROIDECTOMY Right 02/28/2014   Procedure: RIGHT HEMI THYROIDECTOMY;  Surgeon: Ascencion Dike, MD;  Location: [facility];  Service: ENT;  Laterality: Right;  . WISDOM TOOTH EXTRACTION      There were no vitals filed for this visit.  Subjective Assessment - 07/27/19 1220    Subjective  Pt doing very well, has been able to wean off brace. Feels he is moving better. Does have tightness/pain in bil quad region.    Currently in Pain?  Yes    Pain Score  1     Pain Location  Back    Pain Orientation  Right;Left     Pain Descriptors / Indicators  Aching    Pain Type  Acute pain    Pain Onset  More than a month ago    Pain Frequency  Occasional                       [facility] Adult PT Treatment/Exercise - 07/27/19 1223      Lumbar Exercises: Stretches   Active Hamstring Stretch  2 reps;30 seconds    Active Hamstring Stretch Limitations  seated/light     Single Knee to Chest Stretch  --    Piriformis Stretch  2 reps;30 seconds    Piriformis Stretch Limitations  seated     Other Lumbar Stretch Exercise  --      Lumbar Exercises: Aerobic   Recumbent Bike  L3 x 8 min      Lumbar Exercises: Standing   Functional Squats  --    Lifting  From 12";10 reps    Lifting Weights (lbs)  10    Lifting Limitations  with education and practice for proper mechanics     Other Standing Lumbar  Exercises  March, Hip abd x20 each bil;       Lumbar Exercises: Supine   Clam  20 reps    Clam Limitations  GTB with TA    Bent Knee Raise  20 reps    Bent Knee Raise Limitations  RTB    Bridge  15 reps      Manual Therapy   Manual therapy comments  skilled palpation and monitoring of soft tissue with dry needling.     Passive ROM  --       Trigger Point Dry Needling - 07/27/19 0001    Consent Given?  Yes    Education Handout Provided  Previously provided    Muscles Treated Lower Quadrant  Rectus femoris;Vastus lateralis    Rectus femoris Response  Twitch response elicited;Palpable increased muscle length   L and R   Vastus lateralis Response  Twitch response elicited;Palpable increased muscle length   L and R          PT Education - 07/27/19 2121    Education Details  HEP reviewed    Person(s) Educated  Patient    Methods  Explanation;Demonstration;Handout;Verbal cues    Comprehension  Verbalized understanding;Returned demonstration;Verbal cues required;Need further instruction       PT Short Term Goals - 07/27/19 2122      PT SHORT TERM GOAL #1   Title  Pt to be independent with  initial HEP    Time  2    Period  Weeks    Status  Achieved    Target Date  07/06/19      PT SHORT TERM GOAL #2   Title  Pt to report pain in back/leg to 4/10    Time  2    Period  Weeks    Status  Achieved    Target Date  07/06/19      PT SHORT TERM GOAL #3   Title  Pt to perform sit to stand transfer with proper mechanics, and no pain    Time  2    Period  Weeks    Status  Achieved    Target Date  07/06/19        PT Long Term Goals - 07/27/19 2122      PT LONG TERM GOAL #1   Title  Pt to be independent with final HEP    Time  6    Period  Weeks    Status  Partially Met      PT LONG TERM GOAL #2   Title  Pt to report decreased pain to 0-2/10 in back    Time  6    Period  Weeks    Status  Partially Met      PT LONG TERM GOAL #3   Title  Pt to demo ability for proper bend/squat/lift, without pain or difficulty, to improve ability for functional activity, child care, and work duties.    Time  6    Period  Weeks    Status  Partially Met      PT LONG TERM GOAL #4   Title  Pt to report ability for ambulation, up to 20 min, without pain, to improve ability for community activity and work duties.    Time  6    Period  Weeks    Status  Partially Met      PT LONG TERM GOAL #5   Title  Pt to demo ability for lumbar ROM  to be [facility] (  with lumbar post op restrictions) and pain free, to improve ability for ADLs.    Time  6    Period  Weeks    Status  On-going            Plan - 07/27/19 2123    Clinical Impression Statement  Pt with improving ability for ROM, strengthening and functional mobility. Pt still with limitation for flexion ROM, and weakness in Core, but overall making good improvments. DN done for quad pain and trigger points today. Pt to benefit from continued care for strenth, ROM, lifting mechanics and HEP.    Personal Factors and Comorbidities  Past/Current Experience    Examination-Activity Limitations  Bathing;Locomotion Level;Transfers;Bed  Mobility;Bend;Caring for Others;Sit;Sleep;Squat;Dressing;Stairs;Stand;Lift;Toileting    Examination-Participation Restrictions  Meal Prep;Cleaning;Community Activity;Driving;Shop;Laundry;Yard Work    [employer]  Evolving/Moderate complexity    Rehab Potential  Good    PT Frequency  2x / week    PT Duration  6 weeks    PT Treatment/Interventions  ADLs/Self Care Home Management;Cryotherapy;Electrical Stimulation;Gait training;DME Instruction;Ultrasound;Traction;Moist Heat;Iontophoresis 73m/ml Dexamethasone;Stair training;Functional mobility training;Therapeutic activities;Therapeutic exercise;Balance training;Neuromuscular re-education;Manual techniques;Orthotic Fit/Training;Patient/family education;Passive [name] of motion;Dry needling;Taping;Joint Manipulations;Spinal Manipulations;Vasopneumatic Device    Consulted and Agree with Plan of Care  Patient       Patient will benefit from skilled therapeutic intervention in order to improve the following deficits and impairments:  Abnormal gait, Decreased [name] of motion, Difficulty walking, Obesity, Increased muscle spasms, Decreased endurance, Decreased activity tolerance, Pain, Improper body mechanics, Impaired flexibility, Decreased mobility, Decreased strength  Visit Diagnosis: Acute bilateral low back pain with sciatica, sciatica laterality unspecified     Problem List Patient Active Problem List   Diagnosis Date Noted  . Major depressive disorder with single episode, in full remission ([facility] 06/07/2019  . Low back pain 06/02/2019  . 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 Cervical radiculopathy at C6 08/01/2018  . Radial nerve entrapment, right 07/04/2018  . Hyperlipidemia, unspecified 10/22/2017  . Secondary polycythemia 09/09/2017  . Lumbar back pain with radiculopathy affecting right lower extremity 08/29/2016  . Wart 02/27/2015  . Volar plate injury of finger 01/09/2015  . Depression, major 09/27/2014  .  Rash 09/27/2014  . Erectile dysfunction 09/27/2014  . Vitamin D deficiency 07/23/2014  . 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 S/P partial thyroidectomy 02/28/2014  . Anxiety state 01/11/2014  . Obesity (BMI 30-39.9) 01/11/2014  . Cyst of mandible 12/22/2013  . ADHD (attention deficit hyperactivity disorder) 06/02/2011    LLyndee Hensen PT, DPT 9:28 PM  07/27/19    [facility] [address] [address]Phone: [phone]  Fax:  [phone] Name: FNICKHOLAS GOLDSTONMRN: [id number] of Birth: 102/06/1975

## 2019-07-31 ENCOUNTER — Encounter: Payer: Self-pay | Admitting: Physical Therapy

## 2019-07-31 ENCOUNTER — Other Ambulatory Visit: Payer: Self-pay

## 2019-07-31 ENCOUNTER — Ambulatory Visit (INDEPENDENT_AMBULATORY_CARE_PROVIDER_SITE_OTHER): Payer: Commercial Managed Care - PPO | Admitting: Physical Therapy

## 2019-07-31 DIAGNOSIS — M5440 Lumbago with sciatica, unspecified side: Secondary | ICD-10-CM

## 2019-07-31 NOTE — Patient Instructions (Signed)
Access Code: [email or web address] URL: [email or web address] Date: 07/31/2019 Prepared by: Lyndee Hensen  Exercises Supine Single Knee to Chest Stretch - 2 x daily - 3 reps - 30 hold Supine Figure 4 Piriformis Stretch - 2 x daily - 3 reps - 30 hold Seated Piriformis Stretch with Trunk Bend - 2 x daily - 3 reps - 30 hold Supine March - 1 x daily - 2 sets - 10 reps Hooklying Isometric Clamshell - 1 x daily - 2 sets - 10 reps Supine Bridge - 1 x daily - 10 reps - 2 sets Standing Marching - 1 x daily - 2 sets - 10 reps Standing Hip Abduction - 1 x daily - 2 sets - 10 reps Standing Row with Anchored Resistance - 1 x daily - 2 sets - 10 reps Supine Transversus Abdominis Bracing - Hands on Ground - 1 x daily - 2 sets - 10 reps

## 2019-07-31 NOTE — Therapy (Signed)
[facility] [address] [place], [place], [address] Phone: [phone]   Fax:  [phone]  Physical Therapy Treatment  Patient Details  Name: Robert [name] MRN: [id number] Date of Birth: [birth year] Referring Provider (PT): Shanker Ghimire/ Garret Reddish   Encounter Date: 07/31/2019  PT End of Session - 07/31/19 1139    Visit Number  9    Number of Visits  12    Date for PT Re-Evaluation  08/03/19    Authorization Type  [employer]HC    PT Start Time  1016    PT Stop Time  1102    PT Time Calculation (min)  46 min    Equipment Utilized During Treatment  Back brace    Activity Tolerance  Patient limited by pain    Behavior During Therapy  [facility] for tasks assessed/performed       Past Medical History:  Diagnosis Date  . ADD (attention deficit disorder) without hyperactivity    according to his mother/ work test possible  . Anxiety   . Chronic sinusitis   . Depression   . Headache   . History of kidney stones    passed stone  . Hyperlipidemia    no meds, diet controlled  . Hypertension    recently dx 2 months ago, MD just monitoring, no meds at this time  . Hypothyroidism   . Sleep apnea    Uses CPAP nightly.    Past Surgical History:  Procedure Laterality Date  . HERNIA REPAIR     pt. was an infant when this surgery occured  . NASAL SINUS SURGERY    . THYROID SURGERY Right 02/28/2014   DR TEOH    PARTIAL THYROIDECTOMY  . THYROIDECTOMY Right 02/28/2014   Procedure: RIGHT HEMI THYROIDECTOMY;  Surgeon: Ascencion Dike, MD;  Location: [facility];  Service: ENT;  Laterality: Right;  . WISDOM TOOTH EXTRACTION      There were no vitals filed for this visit.  Subjective Assessment - 07/31/19 1134    Subjective  Pt states tightness in R calf, glutes and quads at times. Feels limited with forward bending, but otherwise thinks he is moving much better.    Currently in Pain?  No/denies    Pain Score  0-No pain                        [facility] Adult PT Treatment/Exercise - 07/31/19 1022      Lumbar Exercises: Stretches   Active Hamstring Stretch  --    Active Hamstring Stretch Limitations  --    Single Knee to Chest Stretch  2 reps;30 seconds    Piriformis Stretch  2 reps;30 seconds    Piriformis Stretch Limitations  seated and supine      Lumbar Exercises: Aerobic   Recumbent Bike  L3 x 10 min      Lumbar Exercises: Standing   Row  20 reps    Theraband Level (Row)  Level 3 (Green)    Other Standing Lumbar Exercises  March, Hip abd x20 each bil;     Other Standing Lumbar Exercises  SLS 30 sec x 2 bil;       Lumbar Exercises: Supine   Ab Set  10 reps    Clam  20 reps    Clam Limitations  GTB with TA    Bent Knee Raise  20 reps    Bent Knee Raise Limitations  GTB    Bridge  15 reps             PT Education - 07/31/19 1138    Education Details  HEP updated    Person(s) Educated  Patient    Methods  Explanation;Demonstration;Verbal cues;Handout;Tactile cues    Comprehension  Verbalized understanding;Returned demonstration;Tactile cues required;Verbal cues required       PT Short Term Goals - 07/27/19 2122      PT SHORT TERM GOAL #1   Title  Pt to be independent with initial HEP    Time  2    Period  Weeks    Status  Achieved    Target Date  07/06/19      PT SHORT TERM GOAL #2   Title  Pt to report pain in back/leg to 4/10    Time  2    Period  Weeks    Status  Achieved    Target Date  07/06/19      PT SHORT TERM GOAL #3   Title  Pt to perform sit to stand transfer with proper mechanics, and no pain    Time  2    Period  Weeks    Status  Achieved    Target Date  07/06/19        PT Long Term Goals - 07/27/19 2122      PT LONG TERM GOAL #1   Title  Pt to be independent with final HEP    Time  6    Period  Weeks    Status  Partially Met      PT LONG TERM GOAL #2   Title  Pt to report decreased pain to 0-2/10 in back    Time  6    Period  Weeks     Status  Partially Met      PT LONG TERM GOAL #3   Title  Pt to demo ability for proper bend/squat/lift, without pain or difficulty, to improve ability for functional activity, child care, and work duties.    Time  6    Period  Weeks    Status  Partially Met      PT LONG TERM GOAL #4   Title  Pt to report ability for ambulation, up to 20 min, without pain, to improve ability for community activity and work duties.    Time  6    Period  Weeks    Status  Partially Met      PT LONG TERM GOAL #5   Title  Pt to demo ability for lumbar ROM  to be [facility] (with lumbar post op restrictions) and pain free, to improve ability for ADLs.    Time  6    Period  Weeks    Status  On-going            Plan - 07/31/19 1139    Clinical Impression Statement  Pt progressing very well, with improved ROM, and ease of movement, with transfers and functional activitiy. Doing well without back brace. Discussed continued safety implications for post surgery as well as recommendations for increased exercise and walking, and improtance of core strength. Will continue manual and/or DN for glute pain next visit as needed. Plan to work towards d/c in next 1-2 weeks.    Personal Factors and Comorbidities  Past/Current Experience    Examination-Activity Limitations  Bathing;Locomotion Level;Transfers;Bed Mobility;Bend;Caring for Others;Sit;Sleep;Squat;Dressing;Stairs;Stand;Lift;Toileting    Examination-Participation Restrictions  Meal Prep;Cleaning;Community Activity;Driving;Shop;Laundry;Yard Work    [employer]  Evolving/Moderate complexity  Rehab Potential  Good    PT Frequency  2x / week    PT Duration  6 weeks    PT Treatment/Interventions  ADLs/Self Care Home Management;Cryotherapy;Electrical Stimulation;Gait training;DME Instruction;Ultrasound;Traction;Moist Heat;Iontophoresis 21m/ml Dexamethasone;Stair training;Functional mobility training;Therapeutic activities;Therapeutic  exercise;Balance training;Neuromuscular re-education;Manual techniques;Orthotic Fit/Training;Patient/family education;Passive range of motion;Dry needling;Taping;Joint Manipulations;Spinal Manipulations;Vasopneumatic Device    Consulted and Agree with Plan of Care  Patient       Patient will benefit from skilled therapeutic intervention in order to improve the following deficits and impairments:  Abnormal gait, Decreased range of motion, Difficulty walking, Obesity, Increased muscle spasms, Decreased endurance, Decreased activity tolerance, Pain, Improper body mechanics, Impaired flexibility, Decreased mobility, Decreased strength  Visit Diagnosis: Acute bilateral low back pain with sciatica, sciatica laterality unspecified     Problem List Patient Active Problem List   Diagnosis Date Noted  . Major depressive disorder with single episode, in full remission ([place] 06/07/2019  . Low back pain 06/02/2019  . 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 Cervical radiculopathy at C6 08/01/2018  . Radial nerve entrapment, right 07/04/2018  . Hyperlipidemia, unspecified 10/22/2017  . Secondary polycythemia 09/09/2017  . Lumbar back pain with radiculopathy affecting right lower extremity 08/29/2016  . Wart 02/27/2015  . Volar plate injury of finger 01/09/2015  . Depression, major 09/27/2014  . Rash 09/27/2014  . Erectile dysfunction 09/27/2014  . Vitamin D deficiency 07/23/2014  . 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 S/P partial thyroidectomy 02/28/2014  . Anxiety state 01/11/2014  . Obesity (BMI 30-39.9) 01/11/2014  . Cyst of mandible 12/22/2013  . ADHD (attention deficit hyperactivity disorder) 06/02/2011    LLyndee Hensen PT, DPT 11:42 AM  07/31/19    [facility] [address]Phone: [phone]  Fax:  [phone] Name: Robert LUKASIKMRN: [id number] of Birth: 111/30/1977

## 2019-08-03 ENCOUNTER — Encounter: Payer: Commercial Managed Care - PPO | Admitting: Physical Therapy

## 2019-08-07 ENCOUNTER — Other Ambulatory Visit: Payer: Self-pay

## 2019-08-07 ENCOUNTER — Ambulatory Visit (INDEPENDENT_AMBULATORY_CARE_PROVIDER_SITE_OTHER): Payer: Commercial Managed Care - PPO | Admitting: Physical Therapy

## 2019-08-07 DIAGNOSIS — M5440 Lumbago with sciatica, unspecified side: Secondary | ICD-10-CM | POA: Diagnosis not present

## 2019-08-08 ENCOUNTER — Encounter: Payer: Self-pay | Admitting: Physical Therapy

## 2019-08-08 NOTE — Therapy (Signed)
[facility] [address] [place], [place], [address] Phone: [phone]   Fax:  [phone]  Physical Therapy Treatment/Re-Cert   Patient Details  Name: Robert [name] MRN: [id number] Date of Birth: 09-18-1975 Referring Provider (PT): Shanker Ghimire/ Garret Reddish   Encounter Date: 08/07/2019  PT End of Session - 08/08/19 1228    Visit Number  10    Number of Visits  20    Date for PT Re-Evaluation  09/04/19    Authorization Type  UHC    PT Start Time  1025    PT Stop Time  1107    PT Time Calculation (min)  42 min    Equipment Utilized During Treatment  Back brace    Activity Tolerance  Patient limited by pain    Behavior During Therapy  [facility] for tasks assessed/performed       Past Medical History:  Diagnosis Date  . ADD (attention deficit disorder) without hyperactivity    according to his mother/ work test possible  . Anxiety   . Chronic sinusitis   . Depression   . Headache   . History of kidney stones    passed stone  . Hyperlipidemia    no meds, diet controlled  . Hypertension    recently dx 2 months ago, MD just monitoring, no meds at this time  . Hypothyroidism   . Sleep apnea    Uses CPAP nightly.    Past Surgical History:  Procedure Laterality Date  . HERNIA REPAIR     pt. was an infant when this surgery occured  . NASAL SINUS SURGERY    . THYROID SURGERY Right 02/28/2014   DR TEOH    PARTIAL THYROIDECTOMY  . THYROIDECTOMY Right 02/28/2014   Procedure: RIGHT HEMI THYROIDECTOMY;  Surgeon: Ascencion Dike, MD;  Location: [facility];  Service: ENT;  Laterality: Right;  . WISDOM TOOTH EXTRACTION      There were no vitals filed for this visit.  Subjective Assessment - 08/08/19 1227    Subjective  Pt doing well. Feels "tightness" in R LE, thigh and quad at times.    Currently in Pain?  No/denies    Pain Score  0-No pain         [facility] PT Assessment - 08/08/19 0001      AROM   Lumbar Flexion  mild/mod  limitation    Lumbar Extension  wfl    Lumbar - Right Side Bend  wfl    Lumbar - Left Side Bend  wfl      Strength   Overall Strength Comments  hips: 4/5                    [facility] Adult PT Treatment/Exercise - 08/07/19 1006      Lumbar Exercises: Stretches   Single Knee to Chest Stretch  --    Piriformis Stretch  --    Piriformis Stretch Limitations  --    Other Lumbar Stretch Exercise  Hip flexor and quad stretch off side of table 30 sec x 3 on R:       Lumbar Exercises: Aerobic   Recumbent Bike  L3 x 8 min      Lumbar Exercises: Standing   Row  20 reps    Theraband Level (Row)  Level 3 (Green)    Other Standing Lumbar Exercises  March, Hip abd x20 each bil;     Other Standing Lumbar Exercises  squats x20; practice for  golf stance and swing x10 (gentle swing); Trunk rotation RTB very small ROM x10 bil;       Lumbar Exercises: Supine   Ab Set  --    Clam  --    Clam Limitations  --    Bent Knee Raise  --    Bent Knee Raise Limitations  --    Bridge  15 reps      Manual Therapy   Passive ROM  Manual assisted nerve glide supine with ankle pump x10 on R;  Manual quad stretch on R;                PT Short Term Goals - 08/08/19 1229      PT SHORT TERM GOAL #1   Title  Pt to be independent with initial HEP    Time  2    Period  Weeks    Status  Achieved    Target Date  07/06/19      PT SHORT TERM GOAL #2   Title  Pt to report pain in back/leg to 4/10    Time  2    Period  Weeks    Status  Achieved    Target Date  07/06/19      PT SHORT TERM GOAL #3   Title  Pt to perform sit to stand transfer with proper mechanics, and no pain    Time  2    Period  Weeks    Status  Achieved    Target Date  07/06/19        PT Long Term Goals - 08/08/19 1229      PT LONG TERM GOAL #1   Title  Pt to be independent with final HEP    Time  6    Period  Weeks    Status  Partially Met      PT LONG TERM GOAL #2   Title  Pt to report decreased pain to  0-2/10 in back    Time  6    Period  Weeks    Status  Achieved      PT LONG TERM GOAL #3   Title  Pt to demo ability for proper bend/squat/lift, without pain or difficulty, to improve ability for functional activity, child care, and work duties.    Time  6    Period  Weeks    Status  Partially Met      PT LONG TERM GOAL #4   Title  Pt to report ability for ambulation, up to 20 min, without pain, to improve ability for community activity and work duties.    Time  6    Period  Weeks    Status  Partially Met      PT LONG TERM GOAL #5   Title  Pt to demo ability for lumbar ROM  to be [facility] (with lumbar post op restrictions) and pain free, to improve ability for ADLs.    Time  6    Period  Weeks    Status  Partially Met            Plan - 08/08/19 1230    Clinical Impression Statement  Pt progressing very well, with improved ROM, and ease of movement, with transfers and functional activitiy. Doing well without back brace. Discussed continued safety implications for post surgery as well as recommendations for increased exercise and walking, and improtance of core strength. He does have limited lumbar flexion (to be expected after surery),  and does require cuing for core activation with ther ex. Pt progressing well, will benefit from 1-2 more visits to finalize HEP, to improve mobility and strength.    Personal Factors and Comorbidities  Past/Current Experience    Examination-Activity Limitations  Bathing;Locomotion Level;Transfers;Bed Mobility;Bend;Caring for Others;Sit;Sleep;Squat;Dressing;Stairs;Stand;Lift;Toileting    Examination-Participation Restrictions  Meal Prep;Cleaning;Community Activity;Driving;Shop;Laundry;Yard Work    [employer]  Evolving/Moderate complexity    Rehab Potential  Good    PT Frequency  2x / week    PT Duration  4 weeks    PT Treatment/Interventions  ADLs/Self Care Home Management;Cryotherapy;Electrical Stimulation;Gait training;DME  Instruction;Ultrasound;Traction;Moist Heat;Iontophoresis 71m/ml Dexamethasone;Stair training;Functional mobility training;Therapeutic activities;Therapeutic exercise;Balance training;Neuromuscular re-education;Manual techniques;Orthotic Fit/Training;Patient/family education;Passive range of motion;Dry needling;Taping;Joint Manipulations;Spinal Manipulations;Vasopneumatic Device    Consulted and Agree with Plan of Care  Patient       Patient will benefit from skilled therapeutic intervention in order to improve the following deficits and impairments:  Abnormal gait, Decreased range of motion, Difficulty walking, Obesity, Increased muscle spasms, Decreased endurance, Decreased activity tolerance, Pain, Improper body mechanics, Impaired flexibility, Decreased mobility, Decreased strength  Visit Diagnosis: Acute bilateral low back pain with sciatica, sciatica laterality unspecified     Problem List Patient Active Problem List   Diagnosis Date Noted  . Major depressive disorder with single episode, in full remission ([place] 06/07/2019  . Low back pain 06/02/2019  . 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 Cervical radiculopathy at C6 08/01/2018  . Radial nerve entrapment, right 07/04/2018  . Hyperlipidemia, unspecified 10/22/2017  . Secondary polycythemia 09/09/2017  . Lumbar back pain with radiculopathy affecting right lower extremity 08/29/2016  . Wart 02/27/2015  . Volar plate injury of finger 01/09/2015  . Depression, major 09/27/2014  . Rash 09/27/2014  . Erectile dysfunction 09/27/2014  . Vitamin D deficiency 07/23/2014  . 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 S/P partial thyroidectomy 02/28/2014  . Anxiety state 01/11/2014  . Obesity (BMI 30-39.9) 01/11/2014  . Cyst of mandible 12/22/2013  . ADHD (attention deficit hyperactivity disorder) 06/02/2011    LLyndee Hensen PT, DPT 12:46 PM  08/08/19    [place] [address]Phone: [phone]  Fax:  [phone] Name: Robert KYLLOMRN: [id number] of Birth: 126-Apr-1977

## 2019-08-10 ENCOUNTER — Encounter: Payer: Commercial Managed Care - PPO | Admitting: Physical Therapy

## 2019-08-15 ENCOUNTER — Other Ambulatory Visit: Payer: Self-pay

## 2019-08-15 ENCOUNTER — Encounter: Payer: Self-pay | Admitting: Physical Therapy

## 2019-08-15 ENCOUNTER — Ambulatory Visit (INDEPENDENT_AMBULATORY_CARE_PROVIDER_SITE_OTHER): Payer: Commercial Managed Care - PPO | Admitting: Physical Therapy

## 2019-08-15 DIAGNOSIS — M5440 Lumbago with sciatica, unspecified side: Secondary | ICD-10-CM | POA: Diagnosis not present

## 2019-08-15 NOTE — Therapy (Signed)
[facility] [address] [place], [place], [address] Phone: [phone]   Fax:  [phone]  Physical Therapy Treatment/Discharge  Patient Details  Name: Robert [name] MRN: [id number] Date of Birth: 08-19-1975 Referring Provider (PT): Shanker Ghimire/ Garret Reddish   Encounter Date: 08/15/2019  PT End of Session - 08/15/19 0921    Visit Number  11    Number of Visits  20    Date for PT Re-Evaluation  09/04/19    Authorization Type  UHC    PT Start Time  0848    PT Stop Time  0928    PT Time Calculation (min)  40 min    Equipment Utilized During Treatment  Back brace    Activity Tolerance  Patient limited by pain    Behavior During Therapy  [facility] for tasks assessed/performed       Past Medical History:  Diagnosis Date  . ADD (attention deficit disorder) without hyperactivity    according to his mother/ work test possible  . Anxiety   . Chronic sinusitis   . Depression   . Headache   . History of kidney stones    passed stone  . Hyperlipidemia    no meds, diet controlled  . Hypertension    recently dx 2 months ago, MD just monitoring, no meds at this time  . Hypothyroidism   . Sleep apnea    Uses CPAP nightly.    Past Surgical History:  Procedure Laterality Date  . HERNIA REPAIR     pt. was an infant when this surgery occured  . NASAL SINUS SURGERY    . THYROID SURGERY Right 02/28/2014   DR TEOH    PARTIAL THYROIDECTOMY  . THYROIDECTOMY Right 02/28/2014   Procedure: RIGHT HEMI THYROIDECTOMY;  Surgeon: Ascencion Dike, MD;  Location: [facility];  Service: ENT;  Laterality: Right;  . WISDOM TOOTH EXTRACTION      There were no vitals filed for this visit.  Subjective Assessment - 08/15/19 0929    Subjective  Pt reports doing very well, no pain. Still taking 1 muscle relaxer/day. Has been able to resume all regular activities. Also using standing desk more often.    Currently in Pain?  No/denies    Pain Score  0-No pain          [facility] PT Assessment - 08/15/19 0001      AROM   Lumbar Flexion  6 in from floor     Lumbar Extension  wfl    Lumbar - Right Side Bend  wfl    Lumbar - Left Side Bend  wfl      Strength   Overall Strength Comments  hips/knee: 5/5                     [facility] Adult PT Treatment/Exercise - 08/15/19 0900      Lumbar Exercises: Stretches   Single Knee to Chest Stretch  2 reps;30 seconds    Piriformis Stretch  2 reps;30 seconds    Piriformis Stretch Limitations  supine    Other Lumbar Stretch Exercise  --      Lumbar Exercises: Aerobic   Recumbent Bike  L3 x 8 min      Lumbar Exercises: Standing   Row  20 reps    Theraband Level (Row)  Level 3 (Green)    Other Standing Lumbar Exercises  March, Hip abd (YTB) x20 each bil;     Other Standing Lumbar  Exercises  squats x20;       Lumbar Exercises: Seated   Long Arc Quad on Chair  20 reps      Lumbar Exercises: Supine   Bent Knee Raise  20 reps    Bridge  15 reps             PT Education - 08/15/19 [id number]    Education Details  Final HEP reviewed.    Person(s) Educated  Patient    Methods  Explanation;Demonstration;Verbal cues;Handout    Comprehension  Verbalized understanding;Returned demonstration;Verbal cues required       PT Short Term Goals - 08/08/19 1229      PT SHORT TERM GOAL #1   Title  Pt to be independent with initial HEP    Time  2    Period  Weeks    Status  Achieved    Target Date  07/06/19      PT SHORT TERM GOAL #2   Title  Pt to report pain in back/leg to 4/10    Time  2    Period  Weeks    Status  Achieved    Target Date  07/06/19      PT SHORT TERM GOAL #3   Title  Pt to perform sit to stand transfer with proper mechanics, and no pain    Time  2    Period  Weeks    Status  Achieved    Target Date  07/06/19        PT Long Term Goals - 08/15/19 0929      PT LONG TERM GOAL #1   Title  Pt to be independent with final HEP    Time  6    Period  Weeks    Status   Partially Met      PT LONG TERM GOAL #2   Title  Pt to report decreased pain to 0-2/10 in back    Time  6    Period  Weeks    Status  Achieved      PT LONG TERM GOAL #3   Title  Pt to demo ability for proper bend/squat/lift, without pain or difficulty, to improve ability for functional activity, child care, and work duties.    Time  6    Period  Weeks    Status  Achieved      PT LONG TERM GOAL #4   Title  Pt to report ability for ambulation, up to 20 min, without pain, to improve ability for community activity and work duties.    Time  6    Period  Weeks    Status  Achieved      PT LONG TERM GOAL #5   Title  Pt to demo ability for lumbar ROM  to be [facility] (with lumbar post op restrictions) and pain free, to improve ability for ADLs.    Time  6    Period  Weeks    Status  Achieved            Plan - 08/15/19 0931    Clinical Impression Statement  Pt has made very good progress. Lumbar ROM WFL, much improved postural awareness, and improved squat and movement mechanics. Pt doing well with strengthening and HEP. Discussed continuing to increase walking for exercise. Pt doing well with all functional activities at this time, he will discuss full return to activity with MD at next follow up. Final HEP reviewed today. Ready for d/c to HEP  Personal Factors and Comorbidities  Past/Current Experience    Examination-Activity Limitations  Bathing;Locomotion Level;Transfers;Bed Mobility;Bend;Caring for Others;Sit;Sleep;Squat;Dressing;Stairs;Stand;Lift;Toileting    Examination-Participation Restrictions  Meal Prep;Cleaning;Community Activity;Driving;Shop;Laundry;Yard Work    [employer] officer  Evolving/Moderate complexity    Rehab Potential  Good    PT Frequency  2x / week    PT Duration  4 weeks    PT Treatment/Interventions  ADLs/Self Care Home Management;Cryotherapy;Electrical Stimulation;Gait training;DME Instruction;Ultrasound;Traction;Moist Heat;Iontophoresis 59m/ml  Dexamethasone;Stair training;Functional mobility training;Therapeutic activities;Therapeutic exercise;Balance training;Neuromuscular re-education;Manual techniques;Orthotic Fit/Training;Patient/family education;Passive range of motion;Dry needling;Taping;Joint Manipulations;Spinal Manipulations;Vasopneumatic Device    Consulted and Agree with Plan of Care  Patient       Patient will benefit from skilled therapeutic intervention in order to improve the following deficits and impairments:  Abnormal gait, Decreased range of motion, Difficulty walking, Obesity, Increased muscle spasms, Decreased endurance, Decreased activity tolerance, Pain, Improper body mechanics, Impaired flexibility, Decreased mobility, Decreased strength  Visit Diagnosis: Acute bilateral low back pain with sciatica, sciatica laterality unspecified     Problem List Patient Active Problem List   Diagnosis Date Noted  . Major depressive disorder with single episode, in full remission ([place] 06/07/2019  . Low back pain 06/02/2019  . 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 Cervical radiculopathy at C6 08/01/2018  . Radial nerve entrapment, right 07/04/2018  . Hyperlipidemia, unspecified 10/22/2017  . Secondary polycythemia 09/09/2017  . Lumbar back pain with radiculopathy affecting right lower extremity 08/29/2016  . Wart 02/27/2015  . Volar plate injury of finger 01/09/2015  . Depression, major 09/27/2014  . Rash 09/27/2014  . Erectile dysfunction 09/27/2014  . Vitamin D deficiency 07/23/2014  . 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 S/P partial thyroidectomy 02/28/2014  . Anxiety state 01/11/2014  . Obesity (BMI 30-39.9) 01/11/2014  . Cyst of mandible 12/22/2013  . ADHD (attention deficit hyperactivity disorder) 06/02/2011   LLyndee Hensen PT, DPT 10:26 AM  08/15/19   [facility] [address]Phone: [phone]  Fax:   [phone] Name: FNIKHOLAS GEFFREMRN: [id number] of Birth: [birth year]   PHYSICAL THERAPY DISCHARGE SUMMARY  Visits from Start of Care:11 Plan: Patient agrees to discharge.  Patient goals were met. Patient is being discharged due to meeting the stated rehab goals.  ?????     LLyndee Hensen PT, DPT 10:26 AM  08/15/19

## 2019-08-15 NOTE — Patient Instructions (Signed)
Access Code: [email or web address] URL: [email or web address] Date: 07/31/2019 Prepared by: Lyndee Hensen  Exercises Supine Single Knee to Chest Stretch - 2 x daily - 3 reps - 30 hold Supine Figure 4 Piriformis Stretch - 2 x daily - 3 reps - 30 hold Seated Piriformis Stretch with Trunk Bend - 2 x daily - 3 reps - 30 hold Supine March - 1 x daily - 2 sets - 10 reps Hooklying Isometric Clamshell - 1 x daily - 2 sets - 10 reps Supine Bridge - 1 x daily - 10 reps - 2 sets Standing Marching - 1 x daily - 2 sets - 10 reps Standing Hip Abduction - 1 x daily - 2 sets - 10 reps Standing Row with Anchored Resistance - 1 x daily - 2 sets - 10 reps Supine Transversus Abdominis Bracing - Hands on Ground - 1 x daily - 2 sets - 10 reps

## 2019-08-15 NOTE — Addendum Note (Signed)
Addended by: Lyndee Hensen on: 08/15/2019 11:00 AM   Modules accepted: Orders

## 2019-08-30 ENCOUNTER — Other Ambulatory Visit: Payer: Self-pay

## 2019-08-30 ENCOUNTER — Telehealth (INDEPENDENT_AMBULATORY_CARE_PROVIDER_SITE_OTHER): Payer: Commercial Managed Care - PPO | Admitting: Physician Assistant

## 2019-08-30 ENCOUNTER — Encounter: Payer: Self-pay | Admitting: Physician Assistant

## 2019-08-30 VITALS — Ht 75.0 in | Wt 295.0 lb

## 2019-08-30 DIAGNOSIS — R0981 Nasal congestion: Secondary | ICD-10-CM

## 2019-08-30 MED ORDER — AZITHROMYCIN 250 MG PO TABS: ORAL_TABLET | ORAL | 0 refills | Status: DC

## 2019-08-30 MED ORDER — PREDNISONE 20 MG PO TABS: 40.0000 mg | ORAL_TABLET | Freq: Every day | ORAL | 0 refills | Status: DC

## 2019-08-30 NOTE — Progress Notes (Signed)
Virtual Visit via Video   I connected with Robert [name] on 08/30/19 at 12:30 PM EDT by a video enabled telemedicine application and verified that I am speaking with the correct person using two identifiers. Location patient: Home Location provider: [facility], Office Persons participating in the virtual visit: Jamarius De Hollingshead, Inda Coke PA-C, Anselmo Pickler, LPN   I discussed the limitations of evaluation and management by telemedicine and the availability of in person appointments. The patient expressed understanding and agreed to proceed.  I acted as a Education administrator for [employer], [employer], LPN   Subjective:   HPI:   Sinus problem Pt c/o nasal congestion and cough x 10 days. Coughing and expectorating yellow sputum and yellow nasal drainage. Denies fever, SOB or chills. Has tried Sudafed, Benadryl no relief. Pt was tested for COVID last in March and was Negative. Pt declines COVID vaccine.  74 month old child has similar symptoms.   ROS: See pertinent positives and negatives per HPI.  Patient Active Problem List   Diagnosis Date Noted  . Major depressive disorder with single episode, in full remission ([place]) 06/07/2019  . Low back pain 06/02/2019  . 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 Cervical radiculopathy at C6 08/01/2018  . Radial nerve entrapment, right 07/04/2018  . Hyperlipidemia, unspecified 10/22/2017  . Secondary polycythemia 09/09/2017  . Lumbar back pain with radiculopathy affecting right lower extremity 08/29/2016  . Wart 02/27/2015  . Volar plate injury of finger 01/09/2015  . Depression, major 09/27/2014  . Rash 09/27/2014  . Erectile dysfunction 09/27/2014  . Vitamin D deficiency 07/23/2014  . 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 S/P partial thyroidectomy 02/28/2014  . Anxiety state 01/11/2014  . Obesity (BMI 30-39.9) 01/11/2014  . Cyst of mandible 12/22/2013  . ADHD (attention deficit hyperactivity disorder)  06/02/2011    Social History   Tobacco Use  . Smoking status: Never Smoker  . Smokeless tobacco: Never Used  Substance Use Topics  . Alcohol use: Yes    Alcohol/week: 1.0 - 5.0 standard drinks    Types: 1 - 5 Cans of beer per week    Current Outpatient Medications:  .  Amphet-Dextroamphet 3-Bead ER (MYDAYIS) 37.5 MG CP24, Take 1 capsule by mouth daily., Disp: , Rfl:  .  amphetamine-dextroamphetamine (ADDERALL XR) 30 MG 24 hr capsule, Take 30 mg by mouth daily. , Disp: , Rfl:  .  ARMOUR THYROID 120 MG tablet, Take 120 mg by mouth daily before breakfast. , Disp: , Rfl:  .  Ascorbic Acid (VITAMIN C) 1000 MG tablet, Take 1,000 mg by mouth daily., Disp: , Rfl:  .  baclofen (LIORESAL) 10 MG tablet, Take 10 mg by mouth 3 (three) times daily., Disp: , Rfl:  .  Cholecalciferol (VITAMIN D3 PO), Take 1 capsule by mouth daily., Disp: , Rfl:  .  LORazepam (ATIVAN) 0.5 MG tablet, Take 1 tablet (0.5 mg total) by mouth 2 (two) times daily as needed for anxiety., Disp: 60 tablet, Rfl: 0 .  Menaquinone-7 (VITAMIN K2 PO), Take 1 capsule by mouth daily., Disp: , Rfl:  .  SUMAtriptan (IMITREX) 50 MG tablet, Take 1 tablet (50 mg total) by mouth every 2 (two) hours as needed for migraine. May repeat in 2 hours if headache persists or recurs., Disp: 90 tablet, Rfl: 3 .  tadalafil (CIALIS) 20 MG tablet, Take 0.5-1 tablets (10-20 mg total) by mouth every other day as needed for erectile dysfunction., Disp: 5 tablet, Rfl: 2 .  tamsulosin (FLOMAX)  0.4 MG CAPS capsule, Take 0.4 mg by mouth daily., Disp: , Rfl:  .  azithromycin (ZITHROMAX) 250 MG tablet, Take two tablets on day 1, then one daily x 4 days, Disp: 6 tablet, Rfl: 0 .  predniSONE (DELTASONE) 20 MG tablet, Take 2 tablets (40 mg total) by mouth daily., Disp: 10 tablet, Rfl: 0  Allergies  Allergen Reactions  . Penicillins Anaphylaxis    Did it involve swelling of the face/tongue/throat, SOB, or low BP? Yes Did it involve sudden or severe rash/hives, skin  peeling, or any reaction on the inside of your mouth or nose? Yes Did you need to seek medical attention at a hospital or doctor's office? Yes When did it last happen?53-64 years old If all above answers are "NO", may proceed with cephalosporin use.   . Milk-Related Compounds Other (See Comments)    GI issues Pt reports that he cannot drink milk but can tolerate other dairy products  . Morphine And Related Itching    Full body itching  . Topamax Other (See Comments)    FACE BILATERAL NUMBNESS    Objective:   VITALS: Per patient if applicable, see vitals. GENERAL: Alert, appears well and in no acute distress. HEENT: Atraumatic, conjunctiva clear, no obvious abnormalities on inspection of external nose and ears. NECK: Normal movements of the head and neck. CARDIOPULMONARY: No increased WOB. Speaking in clear sentences. I:E ratio WNL.  MS: Moves all visible extremities without noticeable abnormality. PSYCH: Pleasant and cooperative, well-groomed. Speech normal rate and rhythm. Affect is appropriate. Insight and judgement are appropriate. Attention is focused, linear, and appropriate.  NEURO: CN grossly intact. Oriented as arrived to appointment on time with no prompting. Moves both UE equally.  SKIN: No obvious lesions, wounds, erythema, or cyanosis noted on face or hands.  Assessment and Plan:   Fallou was seen today for sinus problem.  Diagnoses and all orders for this visit:  Sinus congestion  Other orders -     azithromycin (ZITHROMAX) 250 MG tablet; Take two tablets on day 1, then one daily x 4 days -     predniSONE (DELTASONE) 20 MG tablet; Take 2 tablets (40 mg total) by mouth daily.   No red flags on discussion.  Will initiate oral prednisone and azithromycin per orders. Discussed taking medications as prescribed. Reviewed return precautions including worsening fever, SOB, worsening cough or other concerns. Push fluids and rest. I recommend that patient follow-up if  symptoms worsen or persist despite treatment x 7-10 days, sooner if needed.  Did discuss that with his sx I cannot r/o COVID-19. Continue to follow CDC recommendations -- washing hands, wearing masks, avoiding non essential places. Close follow-up if any changes in symptoms. He declines COVID-19 testing at this time.  . Reviewed expectations re: course of current medical issues. . Discussed self-management of symptoms. . Outlined signs and symptoms indicating need for more acute intervention. . Patient verbalized understanding and all questions were answered. Marland Kitchen Health Maintenance issues including appropriate healthy diet, exercise, and smoking avoidance were discussed with patient. . See orders for this visit as documented in the electronic medical record.  I discussed the assessment and treatment plan with the patient. The patient was provided an opportunity to ask questions and all were answered. The patient agreed with the plan and demonstrated an understanding of the instructions.   The patient was advised to call back or seek an in-person evaluation if the symptoms worsen or if the condition fails to improve as anticipated.  CMA or LPN served as scribe during this visit. History, Physical, and Plan performed by medical provider. The above documentation has been reviewed and is accurate and complete.  [place], [place] 08/30/2019

## 2019-11-09 ENCOUNTER — Ambulatory Visit (INDEPENDENT_AMBULATORY_CARE_PROVIDER_SITE_OTHER): Payer: Commercial Managed Care - PPO | Admitting: Family Medicine

## 2019-11-09 ENCOUNTER — Ambulatory Visit: Payer: Self-pay

## 2019-11-09 ENCOUNTER — Encounter: Payer: Self-pay | Admitting: Family Medicine

## 2019-11-09 ENCOUNTER — Other Ambulatory Visit: Payer: Self-pay

## 2019-11-09 VITALS — BP 130/80 | HR 97 | Ht 75.0 in | Wt 304.0 lb

## 2019-11-09 DIAGNOSIS — M5412 Radiculopathy, cervical region: Secondary | ICD-10-CM

## 2019-11-09 DIAGNOSIS — G5631 Lesion of radial nerve, right upper limb: Secondary | ICD-10-CM | POA: Diagnosis not present

## 2019-11-09 DIAGNOSIS — M79641 Pain in right hand: Secondary | ICD-10-CM

## 2019-11-09 NOTE — Assessment & Plan Note (Signed)
Within the differential.  Patient has been on very large doses of gabapentin with no significant improvement.  Patient not on any other significant medications at this time we will continue to monitor.

## 2019-11-09 NOTE — Assessment & Plan Note (Signed)
Patient has signs and symptoms again with more of the radial nerve entrapment again.  We discussed with patient that it could also be cervical radiculopathy and we would discuss the possibility of a nerve conduction study if this continues.  Patient felt like she had he had the most benefit from the injection of anything over the course of the years so we did do another one today.  Discussed different ergonomic changes such as a vertical mouse that would be okay.  Increase activity slowly.  Follow-up again in 4 to 8 weeks.

## 2019-11-09 NOTE — Progress Notes (Signed)
[place] Phone: [phone] Subjective:   Fontaine No, am serving as a scribe for Dr. Hulan Saas. This visit occurred during the SARS-CoV-2 public health emergency.  Safety protocols were in place, including screening questions prior to the visit, additional usage of staff PPE, and extensive cleaning of exam room while observing appropriate contact time as indicated for disinfecting solutions.   I'm seeing this patient by the request  of:  Marin Olp, MD  CC: Hand numbness  [id number]   08/01/2018 Radicular symptoms.  Start gabapentin.  Increase dosing.  We discussed icing regimen and home exercises posture and ergonomics.  Patient will try to make these changes and come back again in 4 weeks.  Update 11/09/2019 PAO HAFFEY is a 44 y.o. male coming in with complaint of right hand numbness. Tearing down old deck last year and felt pop in elbow. States he had injection in elbow which did help. Numbness in 2nd and 3rd fingers which seems to get getting worse. Dropped sandwich the other day without knowing it.     Regarding patient's back MRI of the lumbar spine was done on March 2021.  Found to have postoperative changes of a posterior lumbar interbody fusion at L5-S1  Past Medical History:  Diagnosis Date  . ADD (attention deficit disorder) without hyperactivity    according to his mother/ work test possible  . Anxiety   . Chronic sinusitis   . Depression   . Headache   . History of kidney stones    passed stone  . Hyperlipidemia    no meds, diet controlled  . Hypertension    recently dx 2 months ago, MD just monitoring, no meds at this time  . Hypothyroidism   . Sleep apnea    Uses CPAP nightly.   Past Surgical History:  Procedure Laterality Date  . HERNIA REPAIR     pt. was an infant when this surgery occured  . NASAL SINUS SURGERY    . THYROID SURGERY Right 02/28/2014   DR TEOH     PARTIAL THYROIDECTOMY  . THYROIDECTOMY Right 02/28/2014   Procedure: RIGHT HEMI THYROIDECTOMY;  Surgeon: Ascencion Dike, MD;  Location: [facility];  Service: ENT;  Laterality: Right;  . WISDOM TOOTH EXTRACTION     Social History   Socioeconomic History  . Marital status: Married    Spouse name: Not on file  . Number of children: Not on file  . Years of education: Not on file  . Highest education level: Not on file  Occupational History  . Not on file  Tobacco Use  . Smoking status: Never Smoker  . Smokeless tobacco: Never Used  Vaping Use  . Vaping Use: Never used  Substance and Sexual Activity  . Alcohol use: Yes    Alcohol/week: 1.0 - 5.0 standard drink    Types: 1 - 5 Cans of beer per week  . Drug use: No  . Sexual activity: Yes  Other Topics Concern  . Not on file  Social History Narrative   Married 2016. 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      HH of 1 pets dog and cats.   Social Determinants of Health   Financial Resource Strain:   . Difficulty of Paying Living Expenses:   Food Insecurity:   .  Worried About Charity fundraiser in the Last Year:   . Arboriculturist in the Last Year:   Transportation Needs:   . Film/video editor (Medical):   Marland Kitchen Lack of Transportation (Non-Medical):   Physical Activity:   . Days of Exercise per Week:   . Minutes of Exercise per Session:   Stress:   . Feeling of Stress :   Social Connections:   . Frequency of Communication with Friends and Family:   . Frequency of Social Gatherings with Friends and Family:   . Attends Religious Services:   . Active Member of Clubs or Organizations:   . Attends Archivist Meetings:   Marland Kitchen Marital Status:    Allergies  Allergen Reactions  . Penicillins Anaphylaxis    Did it involve swelling of the face/tongue/throat, SOB, or low BP? Yes Did it involve sudden or severe  rash/hives, skin peeling, or any reaction on the inside of your mouth or nose? Yes Did you need to seek medical attention at a hospital or doctor's office? Yes When did it last happen?36-96 years old If all above answers are "NO", may proceed with cephalosporin use.   . Milk-Related Compounds Other (See Comments)    GI issues Pt reports that he cannot drink milk but can tolerate other dairy products  . Morphine And Related Itching    Full body itching  . Topamax Other (See Comments)    FACE BILATERAL NUMBNESS   Family History  Problem Relation Age of Onset  . Hyperlipidemia Mother   . Hypertension Mother   . Thyroid disease Neg Hx     Current Outpatient Medications (Endocrine & Metabolic):  Marland Kitchen  ARMOUR THYROID 120 MG tablet, Take 120 mg by mouth daily before breakfast.  .  predniSONE (DELTASONE) 20 MG tablet, Take 2 tablets (40 mg total) by mouth daily.  Current Outpatient Medications (Cardiovascular):  .  tadalafil (CIALIS) 20 MG tablet, Take 0.5-1 tablets (10-20 mg total) by mouth every other day as needed for erectile dysfunction.   Current Outpatient Medications (Analgesics):  Marland Kitchen  SUMAtriptan (IMITREX) 50 MG tablet, Take 1 tablet (50 mg total) by mouth every 2 (two) hours as needed for migraine. May repeat in 2 hours if headache persists or recurs.   Current Outpatient Medications (Other):  Marland Kitchen  Amphet-Dextroamphet 3-Bead ER (MYDAYIS) 37.5 MG CP24, Take 1 capsule by mouth daily. Marland Kitchen  amphetamine-dextroamphetamine (ADDERALL XR) 30 MG 24 hr capsule, Take 30 mg by mouth daily.  .  Ascorbic Acid (VITAMIN C) 1000 MG tablet, Take 1,000 mg by mouth daily. .  baclofen (LIORESAL) 10 MG tablet, Take 10 mg by mouth 3 (three) times daily. .  Cholecalciferol (VITAMIN D3 PO), Take 1 capsule by mouth daily. Marland Kitchen  LORazepam (ATIVAN) 0.5 MG tablet, Take 1 tablet (0.5 mg total) by mouth 2 (two) times daily as needed for anxiety. .  Menaquinone-7 (VITAMIN K2 PO), Take 1 capsule by mouth daily. .   tamsulosin (FLOMAX) 0.4 MG CAPS capsule, Take 0.4 mg by mouth daily. Marland Kitchen  azithromycin (ZITHROMAX) 250 MG tablet, Take two tablets on day 1, then one daily x 4 days   Reviewed prior external information including notes and imaging from  primary care provider As well as notes that were available from care everywhere and other healthcare systems.  Past medical history, social, surgical and family history all reviewed in electronic medical record.  No pertanent information unless stated regarding to the chief complaint.   Review of  Systems:  No headache, visual changes, nausea, vomiting, diarrhea, constipation, dizziness, abdominal pain, skin rash, fevers, chills, night sweats, weight loss, swollen lymph nodes, body aches, joint swelling, chest pain, shortness of breath, mood changes. POSITIVE muscle aches  Objective  Blood pressure 130/80, pulse 97, height 6\' 3"  (1.905 m), weight (!) 304 lb (137.9 kg), SpO2 98 %.   General: No apparent distress alert and oriented x3 mood and affect normal, dressed appropriately.  HEENT: Pupils equal, extraocular movements intact  Respiratory: Patient's speak in full sentences and does not appear short of breath  Cardiovascular: No lower extremity edema, non tender, no erythema  Neuro: Cranial nerves II through XII are intact, neurovascularly intact in all extremities with 2+ DTRs and 2+ pulses.  Gait normal with good balance and coordination.  MSK: Patient's neck does have some mild tenderness with any type of extension of it.  No true radicular symptoms though with the Spurling's.  Patient has more pain in the mid substance of the forearm again.  Patient does have good grip strength.  No numbness noted in the C7 distribution   Procedure: Real-time Ultrasound Guided Injection of right radial nerve sheath  Device: GE Logiq Q7 Ultrasound guided injection is preferred based studies that show increased duration, increased effect, greater accuracy, decreased  procedural pain, increased response rate, and decreased cost with ultrasound guided versus blind injection.  Verbal informed consent obtained.  Time-out conducted.  Noted no overlying erythema, induration, or other signs of local infection.  Skin prepped in a sterile fashion.  Local anesthesia: Topical Ethyl chloride.  With sterile technique and under real time ultrasound guidance: With a 25-gauge half inch needle patient was injected into the radial nerve sheath in the mid substance of the forearm with a total of 0.5 cc of 0.5% Marcaine and 0.5 cc of Kenalog 40 mg/mL Completed without difficulty  Pain immediately resolved suggesting accurate placement of the medication.  Advised to call if fevers/chills, erythema, induration, drainage, or persistent bleeding.  Images permanently stored and available for review in the ultrasound unit.  Impression: Technically successful ultrasound guided injection.     Impression and Recommendations:     The above documentation has been reviewed and is accurate and complete Lyndal Pulley, DO       Note: This dictation was prepared with Dragon dictation along with smaller phrase technology. Any transcriptional errors that result from this process are unintentional.

## 2019-11-09 NOTE — Patient Instructions (Addendum)
Injected nerve today I am hoping it helps again.,  Consider vertical mouse.   See me again in 6-8 weeks and we will discuss nerve conduction test

## 2019-12-19 ENCOUNTER — Encounter: Payer: Self-pay | Admitting: Family Medicine

## 2019-12-19 ENCOUNTER — Telehealth (INDEPENDENT_AMBULATORY_CARE_PROVIDER_SITE_OTHER): Payer: Commercial Managed Care - PPO | Admitting: Family Medicine

## 2019-12-19 VITALS — Temp 98.3°F | Wt 301.7 lb

## 2019-12-19 DIAGNOSIS — R0981 Nasal congestion: Secondary | ICD-10-CM

## 2019-12-19 DIAGNOSIS — R059 Cough, unspecified: Secondary | ICD-10-CM

## 2019-12-19 DIAGNOSIS — R05 Cough: Secondary | ICD-10-CM | POA: Diagnosis not present

## 2019-12-19 MED ORDER — DOXYCYCLINE HYCLATE 100 MG PO TABS: 100.0000 mg | ORAL_TABLET | Freq: Two times a day (BID) | ORAL | 0 refills | Status: DC

## 2019-12-19 MED ORDER — BENZONATATE 100 MG PO CAPS: 100.0000 mg | ORAL_CAPSULE | Freq: Three times a day (TID) | ORAL | 0 refills | Status: DC | PRN

## 2019-12-19 NOTE — Progress Notes (Signed)
Virtual Visit via Video Note  I connected with Garhett  on 12/19/19 at  4:20 PM EDT by a video enabled telemedicine application and verified that I am speaking with the correct person using two identifiers.  Location patient: home, [place] Location provider:work or home office Persons participating in the virtual visit: patient, provider  I discussed the limitations of evaluation and management by telemedicine and the availability of in person appointments. The patient expressed understanding and agreed to proceed.   HPI:  Acute telemedicine visit for : -Onset: about 8 days -Symptoms include: nasal congestion, drainage in the throat, sore throat, swollen glands in the neck, cough -Denies: fevers, body aches, HAs, CP, SOB, NVD, sinus pain, loss of taste or smell -no known sick contacts -Has tried: vit C, cough medication he had, cough drops -Pertinent medication allergies: penicillins -COVID-19 vaccine status: not vaccinated; had COVID19 in February  -he is concerned about a sinus infection  ROS: See pertinent positives and negatives per HPI.  Past Medical History:  Diagnosis Date   ADD (attention deficit disorder) without hyperactivity    according to his mother/ work test possible   Anxiety    Chronic sinusitis    Depression    Headache    History of kidney stones    passed stone   Hyperlipidemia    no meds, diet controlled   Hypertension    recently dx 2 months ago, MD just monitoring, no meds at this time   Hypothyroidism    Sleep apnea    Uses CPAP nightly.    Past Surgical History:  Procedure Laterality Date   HERNIA REPAIR     pt. was an infant when this surgery occured   NASAL SINUS SURGERY     THYROID SURGERY Right 02/28/2014   DR TEOH    PARTIAL THYROIDECTOMY   THYROIDECTOMY Right 02/28/2014   Procedure: RIGHT HEMI THYROIDECTOMY;  Surgeon: Ascencion Dike, MD;  Location: [facility] OR;  Service: ENT;  Laterality: Right;   WISDOM TOOTH EXTRACTION        Current Outpatient Medications:    Amphet-Dextroamphet 3-Bead ER (MYDAYIS) 37.5 MG CP24, Take 1 capsule by mouth daily., Disp: , Rfl:    amphetamine-dextroamphetamine (ADDERALL XR) 30 MG 24 hr capsule, Take 30 mg by mouth daily. , Disp: , Rfl:    ARMOUR THYROID 120 MG tablet, Take 120 mg by mouth daily before breakfast. , Disp: , Rfl:    Ascorbic Acid (VITAMIN C) 1000 MG tablet, Take 1,000 mg by mouth daily., Disp: , Rfl:    baclofen (LIORESAL) 10 MG tablet, Take 10 mg by mouth 3 (three) times daily., Disp: , Rfl:    Cholecalciferol (VITAMIN D3 PO), Take 1 capsule by mouth daily., Disp: , Rfl:    LORazepam (ATIVAN) 0.5 MG tablet, Take 1 tablet (0.5 mg total) by mouth 2 (two) times daily as needed for anxiety., Disp: 60 tablet, Rfl: 0   Menaquinone-7 (VITAMIN K2 PO), Take 1 capsule by mouth daily., Disp: , Rfl:    SUMAtriptan (IMITREX) 50 MG tablet, Take 1 tablet (50 mg total) by mouth every 2 (two) hours as needed for migraine. May repeat in 2 hours if headache persists or recurs., Disp: 90 tablet, Rfl: 3   tadalafil (CIALIS) 20 MG tablet, Take 0.5-1 tablets (10-20 mg total) by mouth every other day as needed for erectile dysfunction., Disp: 5 tablet, Rfl: 2   tamsulosin (FLOMAX) 0.4 MG CAPS capsule, Take 0.4 mg by mouth daily., Disp: , Rfl:  benzonatate (TESSALON PERLES) 100 MG capsule, Take 1 capsule (100 mg total) by mouth 3 (three) times daily as needed., Disp: 20 capsule, Rfl: 0   doxycycline (VIBRA-TABS) 100 MG tablet, Take 1 tablet (100 mg total) by mouth 2 (two) times daily., Disp: 20 tablet, Rfl: 0  EXAM:  VITALS per patient if applicable:  GENERAL: alert, oriented, appears well and in no acute distress  HEENT: atraumatic, conjunttiva clear, no obvious abnormalities on inspection of external nose and ears  NECK: normal movements of the head and neck  LUNGS: on inspection no signs of respiratory distress, breathing rate appears normal, no obvious gross SOB,  gasping or wheezing  CV: no obvious cyanosis  MS: moves all visible extremities without noticeable abnormality  PSYCH/NEURO: pleasant and cooperative, no obvious depression or anxiety, speech and thought processing grossly intact  ASSESSMENT AND PLAN:  Discussed the following assessment and plan:  Sinus congestion  Cough  -we discussed possible serious and likely etiologies, options for evaluation and workup, limitations of telemedicine visit vs in person visit, treatment, treatment risks and precautions. Pt prefers to treat via telemedicine empirically rather then risking or undertaking an in person visit at this moment.  Query viral upper respiratory infection, possible COVID-19 versus other.  He is concerned about a sinus infection, it is possible given the duration of symptoms that this is something that could be developing.  Opted to try nasal saline, a short course of Afrin nasal spray along with an antihistamine to see if this helps.  Sent Tessalon Rx for cough.  Discussed risks and benefits of antibiotics and he prefers to have a prescription in case is not improving, doxycycline 100 mg twice daily for 7 to 10 days sent.  Discussed COVID-19 symptoms, testing, immunity, potential complications, treatment.  He likely has some degree immunity from his prior infection.  He agrees to testing and information provided to set this up.  Advise staying home while sick and per guidelines if Covid test is positive. Work/School slipped offered: declined Scheduled follow up with PCP offered: Declined   Advised to seek prompt follow up telemedicine visit or in person care if worsening, new symptoms arise, or if is not improving with treatment. Did let this patient know that I only do telemedicine on Tuesdays and Thursdays for [place]. Advised to schedule follow up visit with PCP or [facility] if any further questions or concerns to avoid delays in care.   I discussed the assessment and treatment plan with the  patient. The patient was provided an opportunity to ask questions and all were answered. The patient agreed with the plan and demonstrated an understanding of the instructions.     Lucretia Kern, DO

## 2019-12-19 NOTE — Patient Instructions (Signed)
-  stay home while sick, and if you have [facility] please stay home for a full 10 days since the onset of symptoms PLUS one day of no fever and feeling better  -[place] COVID19 testing information: [email or web address] OR [phone]  -I sent the medication(s) we discussed to your pharmacy: Meds ordered this encounter  Medications  . doxycycline (VIBRA-TABS) 100 MG tablet    Sig: Take 1 tablet (100 mg total) by mouth 2 (two) times daily.    Dispense:  20 tablet    Refill:  0  . benzonatate (TESSALON PERLES) 100 MG capsule    Sig: Take 1 capsule (100 mg total) by mouth 3 (three) times daily as needed.    Dispense:  20 capsule    Refill:  0    -the doxycycline is a strong antibiotic and I would only advise that if the symptoms are worsening or if you are not improving with the other treatments  -can use tylenol or aleve if needed for fevers, aches and pains per instructions  -can use nasal saline a few times per day if nasal congestion, sometime a short course of Afrin nasal spray for 3 days can help as well  -stay hydrated, drink plenty of fluids and eat small healthy meals - avoid dairy  -can take 1000 IU Vit D3 and Vit C lozenges per instructions  -follow up with your doctor in 2-3 days unless improving and feeling better  I hope you are feeling better soon! Seek in-person care or a follow up telemedicine visit promptly if your symptoms worsen, new concerns arise or you are not improving as expected. Call 911 if severe symptoms.

## 2019-12-21 ENCOUNTER — Ambulatory Visit: Payer: Self-pay

## 2019-12-21 ENCOUNTER — Ambulatory Visit (INDEPENDENT_AMBULATORY_CARE_PROVIDER_SITE_OTHER): Payer: Commercial Managed Care - PPO | Admitting: Family Medicine

## 2019-12-21 ENCOUNTER — Encounter: Payer: Self-pay | Admitting: Family Medicine

## 2019-12-21 ENCOUNTER — Other Ambulatory Visit: Payer: Self-pay

## 2019-12-21 VITALS — BP 128/92 | HR 85 | Ht 75.0 in | Wt 303.0 lb

## 2019-12-21 DIAGNOSIS — M5412 Radiculopathy, cervical region: Secondary | ICD-10-CM

## 2019-12-21 DIAGNOSIS — G5631 Lesion of radial nerve, right upper limb: Secondary | ICD-10-CM

## 2019-12-21 DIAGNOSIS — M542 Cervicalgia: Secondary | ICD-10-CM

## 2019-12-21 DIAGNOSIS — M2550 Pain in unspecified joint: Secondary | ICD-10-CM | POA: Diagnosis not present

## 2019-12-21 DIAGNOSIS — M5416 Radiculopathy, lumbar region: Secondary | ICD-10-CM | POA: Diagnosis not present

## 2019-12-21 MED ORDER — MELOXICAM 15 MG PO TABS: 15.0000 mg | ORAL_TABLET | Freq: Every day | ORAL | 0 refills | Status: DC

## 2019-12-21 NOTE — Assessment & Plan Note (Signed)
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  Previously did respond well to a radial nerve injection but this time did not.  We will get a neck x-ray today to see further if there is any arthritic changes that could be playing a role as well.  Nerve conduction study ordered to further evaluate if any other peripheral versus central neuropathy is occurring.  Meloxicam given for some pain relief.  Follow-up with me again in 2 months but we will make changes depending on labs and imaging and nerve conduction test

## 2019-12-21 NOTE — Addendum Note (Signed)
Addended by: Hazle Quant on: 12/21/2019 02:32 PM   Modules accepted: Orders

## 2019-12-21 NOTE — Progress Notes (Signed)
Robert [name] [place] Robert [name] Phone: [phone] Subjective:   Robert [name], am serving as a scribe for Dr. Hulan [name]. This visit occurred during the SARS-CoV-2 public health emergency.  Safety protocols were in place, including screening questions prior to the visit, additional usage of staff PPE, and extensive cleaning of exam room while observing appropriate contact time as indicated for disinfecting solutions.  I'm seeing this patient by the request  of:  Robert Olp, MD  CC: Neck pain and arm pain follow-up  [id number]   11/09/2019 Within the differential.  Patient has been on very large doses of gabapentin with [name] significant improvement.  Patient not on any other significant medications at this time we will continue to monitor  Patient has signs and symptoms again with more of the radial nerve entrapment again.  We discussed with patient that it could also be cervical radiculopathy and we would discuss the possibility of a nerve conduction study if this continues.  Patient felt like she had he had the most benefit from the injection of anything over the course of the years so we did do another one today.  Discussed different ergonomic changes such as a vertical mouse that would be okay.  Increase activity slowly.  Follow-up again in 4 to 8 weeks.  Update 12/21/2019 Robert [name] is a 44 y.o. male coming in with complaint of right hand tingling and numbness. Feels that he got 1 week of relief. Constant tingling. Is using naltrexone to help with nerve pain that another provider gave to him.  Patient states that it is still very aggravating.  Rates it as a 4 out of 10 in severity.  Patient states that it is frustrating because he can never be completely pain-free.  Sometimes does drop things     Past Medical History:  Diagnosis Date  . ADD (attention deficit disorder) without hyperactivity    according to his mother/  work test possible  . Anxiety   . Chronic sinusitis   . Depression   . Headache   . History of kidney stones    passed stone  . Hyperlipidemia    [name] meds, diet controlled  . Hypertension    recently dx 2 months ago, MD just monitoring, [name] meds at this time  . Hypothyroidism   . Sleep apnea    Uses CPAP nightly.   Past Surgical History:  Procedure Laterality Date  . HERNIA REPAIR     pt. was an infant when this surgery occured  . NASAL SINUS SURGERY    . THYROID SURGERY Right 02/28/2014   DR Robert [name]    PARTIAL THYROIDECTOMY  . THYROIDECTOMY Right 02/28/2014   Procedure: RIGHT HEMI THYROIDECTOMY;  Surgeon: Robert Dike, MD;  Location: [facility];  Service: ENT;  Laterality: Right;  . WISDOM TOOTH EXTRACTION     Social History   Socioeconomic History  . Marital status: Married    Spouse name: Not on file  . Number of children: Not on file  . Years of education: Not on file  . Highest education level: Not on file  Occupational History  . Not on file  Tobacco Use  . Smoking status: Never Smoker  . Smokeless tobacco: Never Used  Vaping Use  . Vaping Use: Never used  Substance and Sexual Activity  . Alcohol use: Yes    Alcohol/week: 1.0 - 5.0 standard drink    Types: 1 - 5 Cans of  beer per week  . Drug use: [name]  . Sexual activity: Yes  Other Topics Concern  . Not on file  Social History Narrative   Married 2016. 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      HH of 1 pets dog and cats.   Social Determinants of Health   Financial Resource Strain:   . Difficulty of Paying Living Expenses: Not on file  Food Insecurity:   . Worried About Charity fundraiser in the Last Year: Not on file  . Ran Out of Food in the Last Year: Not on file  Transportation Needs:   . Lack of Transportation (Medical): Not on file  . Lack of Transportation (Non-Medical): Not on file  Physical  Activity:   . Days of Exercise per Week: Not on file  . Minutes of Exercise per Session: Not on file  Stress:   . Feeling of Stress : Not on file  Social Connections:   . Frequency of Communication with Friends and Family: Not on file  . Frequency of Social Gatherings with Friends and Family: Not on file  . Attends Religious Services: Not on file  . Active Member of Clubs or Organizations: Not on file  . Attends Archivist Meetings: Not on file  . Marital Status: Not on file   Allergies  Allergen Reactions  . Penicillins Anaphylaxis    Did it involve swelling of the face/tongue/throat, SOB, or low BP? Yes Did it involve sudden or severe rash/hives, skin peeling, or any reaction on the inside of your mouth or nose? Yes Did you need to seek medical attention at a hospital or doctor's office? Yes When did it last happen?67-64 years old If all above answers are "[name]", may proceed with cephalosporin use.   . Milk-Related Compounds Other (See Comments)    GI issues Pt reports that he cannot drink milk but can tolerate other dairy products  . Morphine And Related Itching    Full body itching  . Topamax Other (See Comments)    FACE BILATERAL NUMBNESS   Family History  Problem Relation Age of Onset  . Hyperlipidemia Mother   . Hypertension Mother   . Thyroid disease Neg Hx     Current Outpatient Medications (Endocrine & Metabolic):  Marland Kitchen  ARMOUR THYROID 120 MG tablet, Take 120 mg by mouth daily before breakfast.   Current Outpatient Medications (Cardiovascular):  .  tadalafil (CIALIS) 20 MG tablet, Take 0.5-1 tablets (10-20 mg total) by mouth every other day as needed for erectile dysfunction.  Current Outpatient Medications (Respiratory):  .  benzonatate (TESSALON PERLES) 100 MG capsule, Take 1 capsule (100 mg total) by mouth 3 (three) times daily as needed.  Current Outpatient Medications (Analgesics):  Marland Kitchen  SUMAtriptan (IMITREX) 50 MG tablet, Take 1 tablet (50 mg  total) by mouth every 2 (two) hours as needed for migraine. May repeat in 2 hours if headache persists or recurs. .  meloxicam (MOBIC) 15 MG tablet, Take 1 tablet (15 mg total) by mouth daily.   Current Outpatient Medications (Other):  Marland Kitchen  Amphet-Dextroamphet 3-Bead ER (MYDAYIS) 37.5 MG CP24, Take 1 capsule by mouth daily. Marland Kitchen  amphetamine-dextroamphetamine (ADDERALL XR) 30 MG 24 hr capsule, Take 30 mg by mouth daily.  .  Ascorbic Acid (VITAMIN C) 1000 MG tablet, Take 1,000 mg by mouth daily. .  baclofen (LIORESAL) 10  MG tablet, Take 10 mg by mouth 3 (three) times daily. .  Cholecalciferol (VITAMIN D3 PO), Take 1 capsule by mouth daily. Marland Kitchen  doxycycline (VIBRA-TABS) 100 MG tablet, Take 1 tablet (100 mg total) by mouth 2 (two) times daily. Marland Kitchen  LORazepam (ATIVAN) 0.5 MG tablet, Take 1 tablet (0.5 mg total) by mouth 2 (two) times daily as needed for anxiety. .  Menaquinone-7 (VITAMIN K2 PO), Take 1 capsule by mouth daily. .  tamsulosin (FLOMAX) 0.4 MG CAPS capsule, Take 0.4 mg by mouth daily.   Reviewed prior external information including notes and imaging from  primary care provider As well as notes that were available from care everywhere and other healthcare systems.  Past medical history, social, surgical and family history all reviewed in electronic medical record.  [name] pertanent information unless stated regarding to the chief complaint.   Review of Systems:  [name] headache, visual changes, nausea, vomiting, diarrhea, constipation, dizziness, abdominal pain, skin rash, fevers, chills, night sweats, weight loss, swollen lymph nodes, body aches, joint swelling, chest pain, shortness of breath, mood changes. POSITIVE muscle aches  Objective  Blood pressure (!) 128/92, pulse 85, height 6\' 3"  (1.905 m), weight (!) 303 lb (137.4 kg), SpO2 99 %.   General: [name] apparent distress alert and oriented x3 mood and affect normal, dressed appropriately.  HEENT: Pupils equal, extraocular movements intact    Respiratory: Patient's speak in full sentences and does not appear short of breath  Cardiovascular: [name] lower extremity edema, non tender, [name] erythema  Neuro: Cranial nerves II through XII are intact, neurovascularly intact in all extremities with 2+ DTRs and 2+ pulses.  Gait normal with good balance and coordination.  MSK: Right hand exam has full strength noted.  Negative Tinel's.  Some mild tenderness in the forearm itself.  Neck does have some minimal limitation in range of motion lacking the last 5 degrees of extension.  Limited musculoskeletal ultrasound was performed and interpreted by Lyndal Pulley  Limited ultrasound of patient's wrist shows that the median nerve is within the normal area.    Impression and Recommendations:     The above documentation has been reviewed and is accurate and complete Lyndal Pulley, DO       Note: This dictation was prepared with Dragon dictation along with smaller phrase technology. Any transcriptional errors that result from this process are unintentional.

## 2019-12-21 NOTE — Patient Instructions (Addendum)
[facility] for EMG test-They will call you  Meloxicam 15 mg Do not use NSAIDS such as Advil or Aleve when taking Meloxicam It is ok to use Tylenol for additional pain relief  Labs today  See me again in 2 months

## 2019-12-22 LAB — CBC WITH DIFFERENTIAL/PLATELET
Absolute Monocytes: 602 cells/uL (ref 200–950)
Basophils Absolute: 77 cells/uL (ref 0–200)
Basophils Relative: 0.9 %
Eosinophils Absolute: 52 cells/uL (ref 15–500)
Eosinophils Relative: 0.6 %
HCT: 49.3 % (ref 38.5–50.0)
Hemoglobin: 16.8 g/dL (ref 13.2–17.1)
Lymphs Abs: 2933 cells/uL (ref 850–3900)
MCH: 29.5 pg (ref 27.0–33.0)
MCHC: 34.1 g/dL (ref 32.0–36.0)
MCV: 86.6 fL (ref 80.0–100.0)
MPV: 9.9 fL (ref 7.5–12.5)
Monocytes Relative: 7 %
Neutro Abs: 4936 cells/uL (ref 1500–7800)
Neutrophils Relative %: 57.4 %
Platelets: 358 10*3/uL (ref 140–400)
RBC: 5.69 10*6/uL (ref 4.20–5.80)
RDW: 13.3 % (ref 11.0–15.0)
Total Lymphocyte: 34.1 %
WBC: 8.6 10*3/uL (ref 3.8–10.8)

## 2019-12-22 LAB — TSH: TSH: 1.04 mIU/L (ref 0.40–4.50)

## 2019-12-22 LAB — ANA: Anti Nuclear Antibody (ANA): NEGATIVE

## 2020-01-03 ENCOUNTER — Other Ambulatory Visit: Payer: Self-pay

## 2020-01-03 ENCOUNTER — Encounter: Payer: Self-pay | Admitting: Neurology

## 2020-01-03 DIAGNOSIS — R202 Paresthesia of skin: Secondary | ICD-10-CM

## 2020-01-30 ENCOUNTER — Telehealth (INDEPENDENT_AMBULATORY_CARE_PROVIDER_SITE_OTHER): Payer: Commercial Managed Care - PPO | Admitting: Family Medicine

## 2020-01-30 DIAGNOSIS — R21 Rash and other nonspecific skin eruption: Secondary | ICD-10-CM

## 2020-01-30 MED ORDER — TRIAMCINOLONE ACETONIDE 0.1 % EX CREA: 1.0000 "application " | TOPICAL_CREAM | Freq: Two times a day (BID) | CUTANEOUS | 0 refills | Status: DC

## 2020-01-30 NOTE — Progress Notes (Signed)
Virtual Visit via Video Note  I connected with Robert [name]  on 01/30/20 at 11:40 AM EDT by a video enabled telemedicine application and verified that I am speaking with the correct person using two identifiers.  Location patient: home, [place] Location provider:work or home office Persons participating in the virtual visit: patient, provider  I discussed the limitations of evaluation and management by telemedicine and the availability of in person appointments. The patient expressed understanding and agreed to proceed.   HPI:  Acute telemedicine visit for a Rash: -Onset: about 2 weeks ago -Symptoms include: itchy cluster of red bumps in one spot on R inner mid thigh -has been scratching it some -he did travel just prior to this rash appearing -Denies: malaise, fevers, SOB or any other symptoms or rash elsewhere -Has tried: a non-cortisone anti-itch cream   ROS: See pertinent positives and negatives per HPI.  Past Medical History:  Diagnosis Date  . ADD (attention deficit disorder) without hyperactivity    according to his mother/ work test possible  . Anxiety   . Chronic sinusitis   . Depression   . Headache   . History of kidney stones    passed stone  . Hyperlipidemia    no meds, diet controlled  . Hypertension    recently dx 2 months ago, MD just monitoring, no meds at this time  . Hypothyroidism   . Sleep apnea    Uses CPAP nightly.    Past Surgical History:  Procedure Laterality Date  . HERNIA REPAIR     pt. was an infant when this surgery occured  . NASAL SINUS SURGERY    . THYROID SURGERY Right 02/28/2014   DR TEOH    PARTIAL THYROIDECTOMY  . THYROIDECTOMY Right 02/28/2014   Procedure: RIGHT HEMI THYROIDECTOMY;  Surgeon: Ascencion Dike, MD;  Location: [facility];  Service: ENT;  Laterality: Right;  . WISDOM TOOTH EXTRACTION       Current Outpatient Medications:  .  Amphet-Dextroamphet 3-Bead ER (MYDAYIS) 37.5 MG CP24, Take 1 capsule by mouth daily., Disp: , Rfl:  .   amphetamine-dextroamphetamine (ADDERALL XR) 30 MG 24 hr capsule, Take 30 mg by mouth daily. , Disp: , Rfl:  .  ARMOUR THYROID 120 MG tablet, Take 120 mg by mouth daily before breakfast. , Disp: , Rfl:  .  Ascorbic Acid (VITAMIN C) 1000 MG tablet, Take 1,000 mg by mouth daily., Disp: , Rfl:  .  baclofen (LIORESAL) 10 MG tablet, Take 10 mg by mouth 3 (three) times daily., Disp: , Rfl:  .  benzonatate (TESSALON PERLES) 100 MG capsule, Take 1 capsule (100 mg total) by mouth 3 (three) times daily as needed., Disp: 20 capsule, Rfl: 0 .  Cholecalciferol (VITAMIN D3 PO), Take 1 capsule by mouth daily., Disp: , Rfl:  .  doxycycline (VIBRA-TABS) 100 MG tablet, Take 1 tablet (100 mg total) by mouth 2 (two) times daily., Disp: 20 tablet, Rfl: 0 .  LORazepam (ATIVAN) 0.5 MG tablet, Take 1 tablet (0.5 mg total) by mouth 2 (two) times daily as needed for anxiety., Disp: 60 tablet, Rfl: 0 .  meloxicam (MOBIC) 15 MG tablet, Take 1 tablet (15 mg total) by mouth daily., Disp: 30 tablet, Rfl: 0 .  Menaquinone-7 (VITAMIN K2 PO), Take 1 capsule by mouth daily., Disp: , Rfl:  .  SUMAtriptan (IMITREX) 50 MG tablet, Take 1 tablet (50 mg total) by mouth every 2 (two) hours as needed for migraine. May repeat in 2 hours if headache persists  or recurs., Disp: 90 tablet, Rfl: 3 .  tadalafil (CIALIS) 20 MG tablet, Take 0.5-1 tablets (10-20 mg total) by mouth every other day as needed for erectile dysfunction., Disp: 5 tablet, Rfl: 2 .  tamsulosin (FLOMAX) 0.4 MG CAPS capsule, Take 0.4 mg by mouth daily., Disp: , Rfl:  .  triamcinolone cream (KENALOG) 0.1 %, Apply 1 application topically 2 (two) times daily., Disp: 30 g, Rfl: 0  EXAM:  VITALS per patient if applicable:  GENERAL: alert, oriented, appears well and in no acute distress  HEENT: atraumatic, conjunttiva clear, no obvious abnormalities on inspection of external nose and ears  NECK: normal movements of the head and neck  LUNGS: on inspection no signs of  respiratory distress, breathing rate appears normal, no obvious gross SOB, gasping or wheezing  CV: no obvious cyanosis  MS: moves all visible extremities without noticeable abnormality  PSYCH/NEURO: pleasant and cooperative, no obvious depression or anxiety, speech and thought processing grossly intact  ASSESSMENT AND PLAN:  Discussed the following assessment and plan:  Rash  -we discussed possible serious and likely etiologies, options for evaluation and workup, limitations of telemedicine visit vs in person visit, treatment, treatment risks and precautions. Pt prefers to treat via telemedicine empirically rather than in person at this moment.  Query contact dermatitis versus insect bites (such as a cluster bedbug bites) versus other.  He opted to try an antihistamine once daily for 1 to [redacted] weeks along with a topical steroid cream, triamcinolone 0.1 percent twice daily for 1 week.  Sent prescription to the pharmacy.  Advised to seek prompt in person care if worsening, new symptoms arise, or if is not improving with treatment over the next 1 week. Discussed options for inperson care if PCP office not available. Did let this patient know that I only do telemedicine on Tuesdays and Thursdays for [facility]. Advised to schedule follow up visit with PCP or [facility] if any further questions or concerns to avoid delays in care.   I discussed the assessment and treatment plan with the patient. The patient was provided an opportunity to ask questions and all were answered. The patient agreed with the plan and demonstrated an understanding of the instructions.     Lucretia Kern, DO

## 2020-01-30 NOTE — Patient Instructions (Signed)
-  I sent the medication(s) we discussed to your pharmacy: Meds ordered this encounter  Medications  . triamcinolone cream (KENALOG) 0.1 %    Sig: Apply 1 application topically 2 (two) times daily.    Dispense:  30 g    Refill:  0   -take Allegra (or Zyrtec) once daily.  I hope you are feeling better soon!  Seek in person care promptly if your symptoms worsen, new concerns arise or you are not improving with treatment. Follow up with Dr. Yong Channel or elsewhere in-person if not cleared up over the next 1 week, sooner if worsening.  It was nice to meet you today. I help [facility] out with telemedicine visits on Tuesdays and Thursdays and am available for visits on those days. If you have any concerns or questions following this visit please schedule a follow up visit with your Primary Care doctor or seek care at a local urgent care clinic to avoid delays in care.

## 2020-02-13 ENCOUNTER — Ambulatory Visit (INDEPENDENT_AMBULATORY_CARE_PROVIDER_SITE_OTHER): Payer: Commercial Managed Care - PPO | Admitting: Neurology

## 2020-02-13 ENCOUNTER — Other Ambulatory Visit: Payer: Self-pay

## 2020-02-13 DIAGNOSIS — R202 Paresthesia of skin: Secondary | ICD-10-CM

## 2020-02-13 DIAGNOSIS — M5412 Radiculopathy, cervical region: Secondary | ICD-10-CM

## 2020-02-13 NOTE — Procedures (Signed)
[facility]  [place], [place]  [place], [place] [address] Tel: [phone] Fax:  [phone] Test Date:  02/13/2020  Patient: Robert [name] DOB: Mar 04, 1976 Physician: Narda Amber, DO  Sex: Male Height: 6\' 3"  Ref Phys: Hulan Saas, DO  ID#: [id number] Temp: 33.0C Technician:    Patient Complaints: This is a 44 year old man referred for evaluation of right hand paresthesias concerning for radial neuropathy.  NCV & EMG Findings: Extensive electrodiagnostic testing of the right upper extremity shows:  1. Right median, ulnar, radial, and mixed palmar sensory responses are within normal limits. 2. Right median and radial motor responses are within normal limits.  Right ulnar motor response shows slowed conduction velocity across the elbow (A Elbow-B Elbow, 38 m/s).   3. Chronic motor axonal loss changes are seen affecting the C5-C7 myotomes on the right, without accompanied active denervation.    Impression: 1. 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 2. Right ulnar neuropathy with slowing across the elbow, purely demyelinating, mild-to-moderate. 3. There is no evidence of a right radial neuropathy or carpal tunnel syndrome.   ___________________________ Narda Amber, DO    Nerve Conduction Studies Anti Sensory Summary Table   Stim Site NR Peak (ms) Norm Peak (ms) P-T Amp (V) Norm P-T Amp  Right Median Anti Sensory (2nd Digit)  33C  Wrist    3.2 <3.4 39.1 >20  Right Radial Anti Sensory (Base 1st Digit)  33C  Wrist    2.2 <2.7 26.0 >18  Right Ulnar Anti Sensory (5th Digit)  33C  Wrist    3.1 <3.1 26.0 >12   Motor Summary Table   Stim Site NR Onset (ms) Norm Onset (ms) O-P Amp (mV) Norm O-P Amp Site1 Site2 Delta-0 (ms) Dist (cm) Vel (m/s) Norm Vel (m/s)  Right Median Motor (Abd Poll Brev)  33C  Wrist    3.4 <3.9 14.2 >6 Elbow Wrist 5.1 31.0 61 >50  Elbow    8.5  13.8         Right Radial Motor (Ext Ind Prop)   33C  7cm    1.1 <3.1 8.1 >6 B-Spiral Groove 7cm 3.2 19.0 59 >50  B-Spiral Groove    4.3  7.6  A-Spiral Groove B-Spiral Groove 0.5 3.0 60   A-Spiral Groove    4.8  7.5         Right Ulnar Motor (Abd Dig Minimi)  33C  Wrist    2.6 <3.1 9.2 >7 B Elbow Wrist 4.6 26.0 57 >50  B Elbow    7.2  9.1  A Elbow B Elbow 2.6 10.0 38 >50  A Elbow    9.8  8.7          Comparison Summary Table   Stim Site NR Peak (ms) Norm Peak (ms) P-T Amp (V) Site1 Site2 Delta-P (ms) Norm Delta (ms)  Right Median/Ulnar Palm Comparison (Wrist - 8cm)  33C  Median Palm    1.8 <2.2 53.4 Median Palm Ulnar Palm 0.3   Ulnar Palm    1.5 <2.2 12.2       EMG   Side Muscle Ins Act Fibs Psw Fasc Number Recrt Dur Dur. Amp Amp. Poly Poly. Comment  Right 1stDorInt Nml Nml Nml Nml Nml Nml Nml Nml Nml Nml Nml Nml N/A  Right Ext Indicis Nml Nml Nml Nml Nml Nml Nml Nml Nml Nml Nml Nml N/A  Right PronatorTeres Nml Nml Nml Nml 1- Rapid Some 1+ Some 1+ Some 1+ N/A  Right Biceps Nml Nml Nml Nml 1- Rapid Some 1+ Some 1+ Some 1+ N/A  Right Triceps Nml Nml Nml Nml 1- Rapid Some 1+ Some 1+ Some 1+ N/A  Right Deltoid Nml Nml Nml Nml Nml Nml Nml Nml Nml Nml Nml Nml N/A  Right ExtCarRadLong Nml Nml Nml Nml 1- Rapid Few 1+ Few 1+ Nml Nml N/A  Right BrachioRad Nml Nml Nml Nml Nml Nml Nml Nml Nml Nml Nml Nml N/A      Waveforms:

## 2020-02-26 ENCOUNTER — Ambulatory Visit (INDEPENDENT_AMBULATORY_CARE_PROVIDER_SITE_OTHER): Payer: Commercial Managed Care - PPO

## 2020-02-26 ENCOUNTER — Other Ambulatory Visit: Payer: Self-pay

## 2020-02-26 ENCOUNTER — Ambulatory Visit (INDEPENDENT_AMBULATORY_CARE_PROVIDER_SITE_OTHER): Payer: Commercial Managed Care - PPO | Admitting: Family Medicine

## 2020-02-26 ENCOUNTER — Encounter: Payer: Self-pay | Admitting: Family Medicine

## 2020-02-26 VITALS — BP 120/86 | HR 82 | Ht 75.0 in | Wt 303.0 lb

## 2020-02-26 DIAGNOSIS — M5412 Radiculopathy, cervical region: Secondary | ICD-10-CM

## 2020-02-26 DIAGNOSIS — M542 Cervicalgia: Secondary | ICD-10-CM

## 2020-02-26 NOTE — Progress Notes (Signed)
[facility] [place] Phone: [phone] Subjective:   Fontaine No, am serving as a scribe for Dr. Hulan Saas. This visit occurred during the SARS-CoV-2 public health emergency.  Safety protocols were in place, including screening questions prior to the visit, additional usage of staff PPE, and extensive cleaning of exam room while observing appropriate contact time as indicated for disinfecting solutions.   I'm seeing this patient by the request  of:  Marin Olp, MD  CC: Hand pain and weakness  [id number]   12/21/2019 Patient continues to have signs and symptoms consistent with more of a C6 distribution in the C7 distribution of the neck and the radial nerve entrapment.  Previously did respond well to a radial nerve injection but this time did not.  We will get a neck x-ray today to see further if there is any arthritic changes that could be playing a role as well.  Nerve conduction study ordered to further evaluate if any other peripheral versus central neuropathy is occurring.  Meloxicam given for some pain relief.  Follow-up with me again in 2 months but we will make changes depending on labs and imaging and nerve conduction test   Update 02/26/2020 Shea Evans L Mase is a 44 y.o. male coming in with complaint of neck and right upper extremity pain. States that he has not had any change since last visit. Numbness in 2nd finger and part of middle finger.  Patient states that now he is actually drop things from time to time.  Patient states that the pain seems to be a little bit better but not responding to the steroids which she is taking for his back recently.  EMG  02/13/2020  Impression: 1. 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 2. Right ulnar neuropathy with slowing across the elbow, purely demyelinating, mild-to-moderate. 3. There is no evidence of a right radial neuropathy  or carpal tunnel syndrome.      Past Medical History:  Diagnosis Date  . ADD (attention deficit disorder) without hyperactivity    according to his mother/ work test possible  . Anxiety   . Chronic sinusitis   . Depression   . Headache   . History of kidney stones    passed stone  . Hyperlipidemia    no meds, diet controlled  . Hypertension    recently dx 2 months ago, MD just monitoring, no meds at this time  . Hypothyroidism   . Sleep apnea    Uses CPAP nightly.   Past Surgical History:  Procedure Laterality Date  . HERNIA REPAIR     pt. was an infant when this surgery occured  . NASAL SINUS SURGERY    . THYROID SURGERY Right 02/28/2014   DR TEOH    PARTIAL THYROIDECTOMY  . THYROIDECTOMY Right 02/28/2014   Procedure: RIGHT HEMI THYROIDECTOMY;  Surgeon: Ascencion Dike, MD;  Location: [facility];  Service: ENT;  Laterality: Right;  . WISDOM TOOTH EXTRACTION     Social History   Socioeconomic History  . Marital status: Married    Spouse name: Not on file  . Number of children: Not on file  . Years of education: Not on file  . Highest education level: Not on file  Occupational History  . Not on file  Tobacco Use  . Smoking status: Never Smoker  . Smokeless tobacco: Never Used  Vaping Use  . Vaping Use: Never used  Substance and  Sexual Activity  . Alcohol use: Yes    Alcohol/week: 1.0 - 5.0 standard drink    Types: 1 - 5 Cans of beer per week  . Drug use: No  . Sexual activity: Yes  Other Topics Concern  . Not on file  Social History Narrative   Married 2016. 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      HH of 1 pets dog and cats.   Social Determinants of Health   Financial Resource Strain:   . Difficulty of Paying Living Expenses: Not on file  Food Insecurity:   . Worried About Charity fundraiser in the Last Year: Not on file  . Ran Out of Food in the  Last Year: Not on file  Transportation Needs:   . Lack of Transportation (Medical): Not on file  . Lack of Transportation (Non-Medical): Not on file  Physical Activity:   . Days of Exercise per Week: Not on file  . Minutes of Exercise per Session: Not on file  Stress:   . Feeling of Stress : Not on file  Social Connections:   . Frequency of Communication with Friends and Family: Not on file  . Frequency of Social Gatherings with Friends and Family: Not on file  . Attends Religious Services: Not on file  . Active Member of Clubs or Organizations: Not on file  . Attends Archivist Meetings: Not on file  . Marital Status: Not on file   Allergies  Allergen Reactions  . Penicillins Anaphylaxis    Did it involve swelling of the face/tongue/throat, SOB, or low BP? Yes Did it involve sudden or severe rash/hives, skin peeling, or any reaction on the inside of your mouth or nose? Yes Did you need to seek medical attention at a hospital or doctor's office? Yes When did it last happen?61-86 years old If all above answers are "NO", may proceed with cephalosporin use.   . Milk-Related Compounds Other (See Comments)    GI issues Pt reports that he cannot drink milk but can tolerate other dairy products  . Morphine And Related Itching    Full body itching  . Topamax Other (See Comments)    FACE BILATERAL NUMBNESS   Family History  Problem Relation Age of Onset  . Hyperlipidemia Mother   . Hypertension Mother   . Thyroid disease Neg Hx     Current Outpatient Medications (Endocrine & Metabolic):  Marland Kitchen  ARMOUR THYROID 120 MG tablet, Take 120 mg by mouth daily before breakfast.   Current Outpatient Medications (Cardiovascular):  .  tadalafil (CIALIS) 20 MG tablet, Take 0.5-1 tablets (10-20 mg total) by mouth every other day as needed for erectile dysfunction.  Current Outpatient Medications (Respiratory):  .  benzonatate (TESSALON PERLES) 100 MG capsule, Take 1 capsule (100 mg  total) by mouth 3 (three) times daily as needed.  Current Outpatient Medications (Analgesics):  .  meloxicam (MOBIC) 15 MG tablet, Take 1 tablet (15 mg total) by mouth daily. .  SUMAtriptan (IMITREX) 50 MG tablet, Take 1 tablet (50 mg total) by mouth every 2 (two) hours as needed for migraine. May repeat in 2 hours if headache persists or recurs.   Current Outpatient Medications (Other):  Marland Kitchen  Amphet-Dextroamphet 3-Bead ER (MYDAYIS) 37.5 MG CP24, Take 1 capsule by mouth daily. Marland Kitchen  amphetamine-dextroamphetamine (ADDERALL XR) 30 MG 24 hr capsule, Take 30  mg by mouth daily.  .  Ascorbic Acid (VITAMIN C) 1000 MG tablet, Take 1,000 mg by mouth daily. .  baclofen (LIORESAL) 10 MG tablet, Take 10 mg by mouth 3 (three) times daily. .  Cholecalciferol (VITAMIN D3 PO), Take 1 capsule by mouth daily. Marland Kitchen  doxycycline (VIBRA-TABS) 100 MG tablet, Take 1 tablet (100 mg total) by mouth 2 (two) times daily. Marland Kitchen  LORazepam (ATIVAN) 0.5 MG tablet, Take 1 tablet (0.5 mg total) by mouth 2 (two) times daily as needed for anxiety. .  Menaquinone-7 (VITAMIN K2 PO), Take 1 capsule by mouth daily. .  tamsulosin (FLOMAX) 0.4 MG CAPS capsule, Take 0.4 mg by mouth daily. Marland Kitchen  triamcinolone cream (KENALOG) 0.1 %, Apply 1 application topically 2 (two) times daily.   Reviewed prior external information including notes and imaging from  primary care provider As well as notes that were available from care everywhere and other healthcare systems.  Past medical history, social, surgical and family history all reviewed in electronic medical record.  No pertanent information unless stated regarding to the chief complaint.   Review of Systems:  No headache, visual changes, nausea, vomiting, diarrhea, constipation, dizziness, abdominal pain, skin rash, fevers, chills, night sweats, weight loss, swollen lymph nodes, body aches, joint swelling, chest pain, shortness of breath, mood changes. POSITIVE muscle aches  Objective  Blood  pressure 120/86, pulse 82, height 6\' 3"  (1.905 m), weight (!) 303 lb (137.4 kg), SpO2 97 %.   General: No apparent distress alert and oriented x3 mood and affect normal, dressed appropriately.  HEENT: Pupils equal, extraocular movements intact  Respiratory: Patient's speak in full sentences and does not appear short of breath   MSK: Neck exam shows the patient does have mild loss of lordosis.  Patient does not have a positive radicular symptoms but does have some numbness this is consistently in the index finger.  Significant weakness in the C6 distribution on the right side compared to the left at the moment.    Impression and Recommendations:     The above documentation has been reviewed and is accurate and complete Lyndal Pulley, DO

## 2020-02-26 NOTE — Patient Instructions (Signed)
MRI cervical  [phone] Get xray on way out today Will write you with results and discuss plan

## 2020-02-26 NOTE — Assessment & Plan Note (Signed)
Patient has actually weakness in the C6-7 distribution which is different than previous.  May be even worsening per patient.  Patient's neuro conduction study does show the moderate to severe changes at this time.  I would like to get an MRI of the cervical spine to further evaluate.  X-rays are pending.  Due to the weakness I do feel the advanced imaging is warranted and would be a candidate for epidurals.  We have tried multiple different medications with very minimal benefit.  Has had prednisone for the low back that helped the low back but did not help the neck as much.  Depending on findings we will discuss the epidurals versus possible for neurosurgery consult.  Follow-up with me again after imaging.

## 2020-03-03 ENCOUNTER — Ambulatory Visit
Admission: RE | Admit: 2020-03-03 | Discharge: 2020-03-03 | Disposition: A | Discharge status: Home / Self Care | Payer: Commercial Managed Care - PPO | Source: Ambulatory Visit | Attending: Family Medicine | Admitting: Family Medicine

## 2020-03-03 ENCOUNTER — Other Ambulatory Visit: Payer: Self-pay

## 2020-03-03 DIAGNOSIS — M542 Cervicalgia: Secondary | ICD-10-CM

## 2020-03-04 ENCOUNTER — Other Ambulatory Visit: Payer: Self-pay

## 2020-03-04 ENCOUNTER — Encounter: Payer: Self-pay | Admitting: Family Medicine

## 2020-03-04 DIAGNOSIS — M542 Cervicalgia: Secondary | ICD-10-CM

## 2020-03-13 ENCOUNTER — Other Ambulatory Visit: Payer: Self-pay

## 2020-03-13 ENCOUNTER — Ambulatory Visit
Admission: RE | Admit: 2020-03-13 | Discharge: 2020-03-13 | Disposition: A | Discharge status: Home / Self Care | Payer: Commercial Managed Care - PPO | Source: Ambulatory Visit | Attending: Family Medicine | Admitting: Family Medicine

## 2020-03-13 DIAGNOSIS — M542 Cervicalgia: Secondary | ICD-10-CM

## 2020-03-13 MED ORDER — TRIAMCINOLONE ACETONIDE 40 MG/ML IJ SUSP (RADIOLOGY)
60.0000 mg | Freq: Once | INTRAMUSCULAR | Status: AC
  Administered 2020-03-13: 60 mg via EPIDURAL

## 2020-03-13 MED ORDER — IOPAMIDOL (ISOVUE-M 300) INJECTION 61%
1.0000 mL | Freq: Once | INTRAMUSCULAR | Status: AC | PRN
  Administered 2020-03-13: 1 mL via EPIDURAL

## 2020-03-13 NOTE — Discharge Instructions (Signed)

## 2020-03-20 ENCOUNTER — Encounter: Payer: Self-pay | Admitting: Physician Assistant

## 2020-03-20 ENCOUNTER — Telehealth (INDEPENDENT_AMBULATORY_CARE_PROVIDER_SITE_OTHER): Payer: Commercial Managed Care - PPO | Admitting: Physician Assistant

## 2020-03-20 VITALS — Ht 75.0 in | Wt 300.0 lb

## 2020-03-20 DIAGNOSIS — R059 Cough, unspecified: Secondary | ICD-10-CM | POA: Diagnosis not present

## 2020-03-20 MED ORDER — PREDNISONE 20 MG PO TABS: 40.0000 mg | ORAL_TABLET | Freq: Every day | ORAL | 0 refills | Status: DC

## 2020-03-20 MED ORDER — AZITHROMYCIN 250 MG PO TABS: ORAL_TABLET | ORAL | 0 refills | Status: DC

## 2020-03-20 NOTE — Progress Notes (Signed)
Virtual Visit via Video   I connected with Robert [name] on 03/20/20 at 12:30 PM EST by a video enabled telemedicine application and verified that I am speaking with the correct person using two identifiers. Location patient: Home Location provider: [facility], Office Persons participating in the virtual visit: Robert [name], Inda Coke PA-C  I discussed the limitations of evaluation and management by telemedicine and the availability of in person appointments. The patient expressed understanding and agreed to proceed.   Subjective:   HPI:   Patient is requesting evaluation for possible COVID-19.  Symptom onset: Started Saturday  Travel/contacts: Not aware of any exposure and has not traveled  Vaccination status: No  Negative COVID test yesterday  Patient endorses the following symptoms: sinus headache, sinus congestion, rhinorrhea, itchy watery eyes, sore throat, productive cough (expecxtorating green / yellow), wheezing and myalgia  Patient denies the following symptoms: Fever (none), ear pain, shortness of breath, chest tightness and chest pain  Treatments tried Robitussin, Nyquil, Mucinex D  Patient risk factors: Current ZOXWR-60 risk of complications score: 2 Smoking status: Robert [name]  reports that he has never smoked. He has never used smokeless tobacco. If male, currently pregnant? []   Yes []   No  ROS: See pertinent positives and negatives per HPI.  Patient Active Problem List   Diagnosis Date Noted  . Major depressive disorder with single episode, in full remission ([place]) 06/07/2019  . Low back pain 06/02/2019  . 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 Cervical radiculopathy at C6 08/01/2018  . Radial nerve entrapment, right 07/04/2018  . Hyperlipidemia, unspecified 10/22/2017  . Secondary polycythemia 09/09/2017  . Lumbar back pain with radiculopathy affecting right lower extremity 08/29/2016  . Wart 02/27/2015  . Volar plate  injury of finger 01/09/2015  . Depression, major 09/27/2014  . Rash 09/27/2014  . Erectile dysfunction 09/27/2014  . Vitamin D deficiency 07/23/2014  . 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 S/P partial thyroidectomy 02/28/2014  . Anxiety state 01/11/2014  . Obesity (BMI 30-39.9) 01/11/2014  . Cyst of mandible 12/22/2013  . ADHD (attention deficit hyperactivity disorder) 06/02/2011    Social History   Tobacco Use  . Smoking status: Never Smoker  . Smokeless tobacco: Never Used  Substance Use Topics  . Alcohol use: Yes    Alcohol/week: 1.0 - 5.0 standard drink    Types: 1 - 5 Cans of beer per week    Current Outpatient Medications:  .  Amphet-Dextroamphet 3-Bead ER (MYDAYIS) 37.5 MG CP24, Take 1 capsule by mouth daily., Disp: , Rfl:  .  amphetamine-dextroamphetamine (ADDERALL XR) 30 MG 24 hr capsule, Take 30 mg by mouth daily. , Disp: , Rfl:  .  ARMOUR THYROID 120 MG tablet, Take 120 mg by mouth daily before breakfast. , Disp: , Rfl:  .  Ascorbic Acid (VITAMIN C) 1000 MG tablet, Take 1,000 mg by mouth daily., Disp: , Rfl:  .  baclofen (LIORESAL) 10 MG tablet, Take 10 mg by mouth 3 (three) times daily., Disp: , Rfl:  .  Cholecalciferol (VITAMIN D3 PO), Take 1 capsule by mouth daily., Disp: , Rfl:  .  Menaquinone-7 (VITAMIN K2 PO), Take 1 capsule by mouth daily., Disp: , Rfl:  .  SUMAtriptan (IMITREX) 50 MG tablet, Take 1 tablet (50 mg total) by mouth every 2 (two) hours as needed for migraine. May repeat in 2 hours if headache persists or recurs., Disp: 90 tablet, Rfl: 3 .  tadalafil (CIALIS) 20 MG tablet, Take 0.5-1 tablets (10-20  mg total) by mouth every other day as needed for erectile dysfunction., Disp: 5 tablet, Rfl: 2 .  tamsulosin (FLOMAX) 0.4 MG CAPS capsule, Take 0.4 mg by mouth daily., Disp: , Rfl:  .  triamcinolone cream (KENALOG) 0.1 %, Apply 1 application topically 2 (two) times daily., Disp: 30 g, Rfl: 0 .  azithromycin (ZITHROMAX) 250 MG tablet, Take two tablets  on day 1, then one daily x 4 days, Disp: 6 tablet, Rfl: 0 .  LORazepam (ATIVAN) 0.5 MG tablet, Take 1 tablet (0.5 mg total) by mouth 2 (two) times daily as needed for anxiety. (Patient not taking: Reported on 03/20/2020), Disp: 60 tablet, Rfl: 0 .  predniSONE (DELTASONE) 20 MG tablet, Take 2 tablets (40 mg total) by mouth daily., Disp: 10 tablet, Rfl: 0  Allergies  Allergen Reactions  . Penicillins Anaphylaxis    Did it involve swelling of the face/tongue/throat, SOB, or low BP? Yes Did it involve sudden or severe rash/hives, skin peeling, or any reaction on the inside of your mouth or nose? Yes Did you need to seek medical attention at a hospital or doctor's office? Yes When did it last happen?72-40 years old If all above answers are "NO", may proceed with cephalosporin use.   . Topamax Other (See Comments)    FACE BILATERAL NUMBNESS  . Milk-Related Compounds Other (See Comments)    GI issues Pt reports that he cannot drink milk but can tolerate other dairy products  . Morphine And Related Itching    Full body itching    Objective:   VITALS: Per patient if applicable, see vitals. GENERAL: Alert, appears well and in no acute distress. HEENT: Atraumatic, conjunctiva clear, no obvious abnormalities on inspection of external nose and ears. NECK: Normal movements of the head and neck. CARDIOPULMONARY: No increased WOB. Speaking in clear sentences. I:E ratio WNL.  MS: Moves all visible extremities without noticeable abnormality. PSYCH: Pleasant and cooperative, well-groomed. Speech normal rate and rhythm. Affect is appropriate. Insight and judgement are appropriate. Attention is focused, linear, and appropriate.  NEURO: CN grossly intact. Oriented as arrived to appointment on time with no prompting. Moves both UE equally.  SKIN: No obvious lesions, wounds, erythema, or cyanosis noted on face or hands.  Assessment and Plan:   Robert [name] was seen today for covid symptoms.  Diagnoses  and all orders for this visit:  Cough  Other orders -     azithromycin (ZITHROMAX) 250 MG tablet; Take two tablets on day 1, then one daily x 4 days -     predniSONE (DELTASONE) 20 MG tablet; Take 2 tablets (40 mg total) by mouth daily.   No red flags on discussion, patient is not in any obvious distress during our visit. Discussed progression of most viral illness, and recommended supportive care at this point in time. I did however provide pocket rx for oral azithromycin and prednisone should symptoms not improve as anticipated. Discussed over the counter supportive care options, with recommendations to push fluids and rest. Reviewed return precautions including new/worsening fever, SOB, new/worsening cough or other concerns.  Recommended need to self-quarantine and practice social distancing until symptoms resolve. Discussed current recommendations for COVID testing. I recommend that patient follow-up if symptoms worsen or persist despite treatment x 7-10 days, sooner if needed.  I discussed the assessment and treatment plan with the patient. The patient was provided an opportunity to ask questions and all were answered. The patient agreed with the plan and demonstrated an understanding of the instructions.  The patient was advised to call back or seek an in-person evaluation if the symptoms worsen or if the condition fails to improve as anticipated.   CMA or LPN served as scribe during this visit. History, Physical, and Plan performed by medical provider. The above documentation has been reviewed and is accurate and complete.  [place], [place] 03/20/2020

## 2020-03-27 ENCOUNTER — Telehealth (INDEPENDENT_AMBULATORY_CARE_PROVIDER_SITE_OTHER): Payer: Commercial Managed Care - PPO | Admitting: Family Medicine

## 2020-03-27 ENCOUNTER — Encounter: Payer: Self-pay | Admitting: Family Medicine

## 2020-03-27 VITALS — Ht 75.0 in | Wt 302.0 lb

## 2020-03-27 DIAGNOSIS — J209 Acute bronchitis, unspecified: Secondary | ICD-10-CM

## 2020-03-27 DIAGNOSIS — B9689 Other specified bacterial agents as the cause of diseases classified elsewhere: Secondary | ICD-10-CM

## 2020-03-27 DIAGNOSIS — J329 Chronic sinusitis, unspecified: Secondary | ICD-10-CM | POA: Diagnosis not present

## 2020-03-27 MED ORDER — BENZONATATE 200 MG PO CAPS: 200.0000 mg | ORAL_CAPSULE | Freq: Three times a day (TID) | ORAL | 0 refills | Status: DC

## 2020-03-27 MED ORDER — FLUTICASONE PROPIONATE 50 MCG/ACT NA SUSP: 2.0000 | Freq: Every day | NASAL | 0 refills | Status: DC

## 2020-03-27 MED ORDER — DOXYCYCLINE HYCLATE 100 MG PO TABS: 100.0000 mg | ORAL_TABLET | Freq: Two times a day (BID) | ORAL | 0 refills | Status: DC

## 2020-03-27 MED ORDER — GUAIFENESIN-CODEINE 100-10 MG/5ML PO SOLN: 5.0000 mL | Freq: Every day | ORAL | 0 refills | Status: DC

## 2020-03-27 NOTE — Progress Notes (Signed)
VIRTUAL VISIT VIA VIDEO  I connected with Robert [name] on 03/27/20 at  1:30 PM EST by elemedicine application and verified that I am speaking with the correct person using two identifiers. Location patient: Home Location provider: [facility], Office Persons participating in the virtual visit: Patient, Dr. Raoul Pitch and Samul Dada, CMA  I discussed the limitations of evaluation and management by telemedicine and the availability of in person appointments. The patient expressed understanding and agreed to proceed.   SUBJECTIVE Chief Complaint  Patient presents with  . Nasal Congestion    All symptoms for about 10 days  . Cough    Chest soreness from coughing too much / took in home covid test 5 days ago and tested negative.   . Sinus Problem    Ears, watery eyes    HPI: Robert [name] is a 44 y.o. male present for cough, nasal congestion, chest soreness from cough, sinus pressure, ear fullness and watery eyes- covid test neg 5 days ago and today also  negative- symptoms started >10 days ago. covid and flu vaccines had been declined.  He denies fever, chills, nausea or vomit. He denies GI sx.  He had a video visit 12/22 and prescribed azith and prednisone x5 d  He is taking Zicam, mucinex, vit c, robitussin, nasal saline  ROS: See pertinent positives and negatives per HPI.  Patient Active Problem List   Diagnosis Date Noted  . Major depressive disorder with single episode, in full remission ([place]) 06/07/2019  . Low back pain 06/02/2019  . 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 Cervical radiculopathy at C6 08/01/2018  . Radial nerve entrapment, right 07/04/2018  . Hyperlipidemia, unspecified 10/22/2017  . Secondary polycythemia 09/09/2017  . Lumbar back pain with radiculopathy affecting right lower extremity 08/29/2016  . Wart 02/27/2015  . Volar plate injury of finger 01/09/2015  . Depression, major 09/27/2014  . Rash 09/27/2014  . Erectile dysfunction  09/27/2014  . Vitamin D deficiency 07/23/2014  . 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 S/P partial thyroidectomy 02/28/2014  . Anxiety state 01/11/2014  . Obesity (BMI 30-39.9) 01/11/2014  . Cyst of mandible 12/22/2013  . ADHD (attention deficit hyperactivity disorder) 06/02/2011    Social History   Tobacco Use  . Smoking status: Never Smoker  . Smokeless tobacco: Never Used  Substance Use Topics  . Alcohol use: Yes    Alcohol/week: 1.0 - 5.0 standard drink    Types: 1 - 5 Cans of beer per week    Current Outpatient Medications:  .  Amphet-Dextroamphet 3-Bead ER (MYDAYIS) 37.5 MG CP24, Take 1 capsule by mouth daily., Disp: , Rfl:  .  amphetamine-dextroamphetamine (ADDERALL XR) 30 MG 24 hr capsule, Take 30 mg by mouth daily. , Disp: , Rfl:  .  ARMOUR THYROID 120 MG tablet, Take 120 mg by mouth daily before breakfast. , Disp: , Rfl:  .  Ascorbic Acid (VITAMIN C) 1000 MG tablet, Take 1,000 mg by mouth daily., Disp: , Rfl:  .  baclofen (LIORESAL) 10 MG tablet, Take 10 mg by mouth 3 (three) times daily., Disp: , Rfl:  .  benzonatate (TESSALON) 200 MG capsule, Take 1 capsule (200 mg total) by mouth 3 (three) times daily., Disp: 30 capsule, Rfl: 0 .  Cholecalciferol (VITAMIN D3 PO), Take 1 capsule by mouth daily., Disp: , Rfl:  .  doxycycline (VIBRA-TABS) 100 MG tablet, Take 1 tablet (100 mg total) by mouth 2 (two) times daily., Disp: 20 tablet, Rfl: 0 .  fluticasone (FLONASE) 50 MCG/ACT nasal spray, Place 2 sprays into both nostrils daily., Disp: 16 g, Rfl: 0 .  guaiFENesin-codeine 100-10 MG/5ML syrup, Take 5-10 mLs by mouth at bedtime., Disp: 120 mL, Rfl: 0 .  LORazepam (ATIVAN) 0.5 MG tablet, Take 1 tablet (0.5 mg total) by mouth 2 (two) times daily as needed for anxiety., Disp: 60 tablet, Rfl: 0 .  Menaquinone-7 (VITAMIN K2 PO), Take 1 capsule by mouth daily., Disp: , Rfl:  .  predniSONE (DELTASONE) 20 MG tablet, Take 2 tablets (40 mg total) by mouth daily., Disp: 10 tablet, Rfl:  0 .  SUMAtriptan (IMITREX) 50 MG tablet, Take 1 tablet (50 mg total) by mouth every 2 (two) hours as needed for migraine. May repeat in 2 hours if headache persists or recurs., Disp: 90 tablet, Rfl: 3 .  tadalafil (CIALIS) 20 MG tablet, Take 0.5-1 tablets (10-20 mg total) by mouth every other day as needed for erectile dysfunction., Disp: 5 tablet, Rfl: 2 .  tamsulosin (FLOMAX) 0.4 MG CAPS capsule, Take 0.4 mg by mouth daily., Disp: , Rfl:  .  triamcinolone cream (KENALOG) 0.1 %, Apply 1 application topically 2 (two) times daily., Disp: 30 g, Rfl: 0  Allergies  Allergen Reactions  . Penicillins Anaphylaxis    Did it involve swelling of the face/tongue/throat, SOB, or low BP? Yes Did it involve sudden or severe rash/hives, skin peeling, or any reaction on the inside of your mouth or nose? Yes Did you need to seek medical attention at a hospital or doctor's office? Yes When did it last happen?37-82 years old If all above answers are "NO", may proceed with cephalosporin use.   . Topamax Other (See Comments)    FACE BILATERAL NUMBNESS  . Milk-Related Compounds Other (See Comments)    GI issues Pt reports that he cannot drink milk but can tolerate other dairy products  . Morphine And Related Itching    Full body itching    OBJECTIVE: Ht 6\' 3"  (1.905 m)   Wt (!) 302 lb (137 kg)   BMI 37.75 kg/m  Gen: No acute distress. Nontoxic in appearance. Nasal congestion present.  HENT: AT. [place].  MMM.  Eyes:Pupils Equal Round Reactive to light, Extraocular movements intact,  Conjunctiva without redness, discharge or icterus. Chest: Cough present. No shortness of breath present.  Skin: no rashes, purpura or petechiae.  Neuro: . Alert. Oriented x3   ASSESSMENT AND PLAN: Robert [name] is a 44 y.o. male present for  Bacterial sinusitis/Acute bronchitis with symptoms > 10 days Rest, hydrate.  Start flonase, mucinex DM if cough Continue nasal saline.  Robitussin AC for cough. No replacements  if out of stock.  Doxy bid prescribed, take until completed.  If cough present it can last up to 6-8 weeks.  F/U 2 weeks of not improved.    47, DO 03/27/2020   Return if symptoms worsen or fail to improve.  No orders of the defined types were placed in this encounter.  Meds ordered this encounter  Medications  . benzonatate (TESSALON) 200 MG capsule    Sig: Take 1 capsule (200 mg total) by mouth 3 (three) times daily.    Dispense:  30 capsule    Refill:  0  . doxycycline (VIBRA-TABS) 100 MG tablet    Sig: Take 1 tablet (100 mg total) by mouth 2 (two) times daily.    Dispense:  20 tablet    Refill:  0  . fluticasone (FLONASE) 50 MCG/ACT nasal spray  Sig: Place 2 sprays into both nostrils daily.    Dispense:  16 g    Refill:  0  . guaiFENesin-codeine 100-10 MG/5ML syrup    Sig: Take 5-10 mLs by mouth at bedtime.    Dispense:  120 mL    Refill:  0   Referral Orders  No referral(s) requested today

## 2020-03-27 NOTE — Patient Instructions (Signed)

## 2020-04-18 ENCOUNTER — Other Ambulatory Visit: Payer: Self-pay | Admitting: Family Medicine

## 2020-05-27 ENCOUNTER — Other Ambulatory Visit (HOSPITAL_COMMUNITY): Payer: Self-pay | Admitting: Otolaryngology

## 2020-05-27 DIAGNOSIS — R1310 Dysphagia, unspecified: Secondary | ICD-10-CM

## 2020-05-31 ENCOUNTER — Other Ambulatory Visit: Payer: Self-pay

## 2020-05-31 ENCOUNTER — Ambulatory Visit (HOSPITAL_COMMUNITY)
Admission: RE | Admit: 2020-05-31 | Discharge: 2020-05-31 | Disposition: A | Discharge status: Home / Self Care | Payer: Commercial Managed Care - PPO | Source: Ambulatory Visit | Attending: Otolaryngology | Admitting: Otolaryngology

## 2020-05-31 DIAGNOSIS — R1310 Dysphagia, unspecified: Secondary | ICD-10-CM | POA: Insufficient documentation

## 2020-06-10 NOTE — Patient Instructions (Addendum)
Please stop by lab before you go If you have mychart- we will send your results within 3 business days of [place] receiving them.  If you do not have mychart- we will call you about results within 5 business days of [place] receiving them.  *please also note that you will see labs on mychart as soon as they post. I will later go in and write notes on them- will say "notes from Dr. Yong Channel"  Health Maintenance Due  Topic Date Due  . Hepatitis C Screening Done today in office.  Never done   -lets try triamcinolone twice daily for 7 days. He will contact me if not improving through call or mychart and will try halog (wife has used in past and very helpful and he has tried in past as well and very helpful for similar lesion)   -lets get weight down slightly- at least 5 lbs over next year. I like Dr. Cynda Acres diet on youtube. He has other good videos as well  Your blood pressure trend concerns me- slightly high today even on repeat. I would like for you to buy/use a home cuff to check at least 4x a week. Your goal is <135/85 on average- update me in 2-3 weeks with home readings.   Recommended follow up: Return in about 1 year (around 06/11/2021) for physical or sooner if needed.

## 2020-06-10 NOTE — Progress Notes (Signed)
Phone: [phone]    Subjective:  Patient presents today for their annual physical. Chief complaint-noted.   See problem oriented charting- ROS- full  review of systems was completed and negative  except for: tends to be hotter- sweats more under right arm in particular even if hes not hot, occasionally with trouble swallowing- went back to ENT a few weeks ago- had direct visualization and has had swallow study- ENT planning MRI vs. Ultrasound  The following were reviewed and entered/updated in epic: Past Medical History:  Diagnosis Date  . ADD (attention deficit disorder) without hyperactivity    according to his mother/ work test possible  . Anxiety   . Chronic sinusitis   . Depression   . Headache   . History of kidney stones    passed stone  . Hyperlipidemia    no meds, diet controlled  . Hypertension    recently dx 2 months ago, MD just monitoring, no meds at this time  . Hypothyroidism   . Sleep apnea    Uses CPAP nightly.   Patient Active Problem List   Diagnosis Date Noted  . Low back pain 06/02/2019    Priority: High  . Spondylolysis, lumbar region 04/21/2019    Priority: High  . 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 Lumbar back pain with radiculopathy affecting right lower extremity 08/29/2016    Priority: High  . Hyperlipidemia, unspecified 10/22/2017    Priority: Medium  . Depression, major 09/27/2014    Priority: Medium  . 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 Anxiety state 01/11/2014    Priority: Medium  . Obesity (BMI 30-39.9) 01/11/2014    Priority: Medium  . ADHD (attention deficit hyperactivity disorder) 06/02/2011    Priority: Medium  . Major depressive disorder with single episode, in full remission ([place]) 06/07/2019    Priority: Low  . Cervical radiculopathy at C6 08/01/2018    Priority: Low  . Radial nerve entrapment, right 07/04/2018    Priority: Low  . Wart 02/27/2015    Priority: Low  .  Volar plate injury of finger 01/09/2015    Priority: Low  . Rash 09/27/2014    Priority: Low  . Erectile dysfunction 09/27/2014    Priority: Low  . Vitamin D deficiency 07/23/2014    Priority: Low  . S/P partial thyroidectomy 02/28/2014    Priority: Low  . Cyst of mandible 12/22/2013    Priority: Low   Past Surgical History:  Procedure Laterality Date  . HERNIA REPAIR     pt. was an infant when this surgery occured  . NASAL SINUS SURGERY    . THYROID SURGERY Right 02/28/2014   DR TEOH    PARTIAL THYROIDECTOMY  . THYROIDECTOMY Right 02/28/2014   Procedure: RIGHT HEMI THYROIDECTOMY;  Surgeon: Ascencion Dike, MD;  Location: [facility];  Service: ENT;  Laterality: Right;  . WISDOM TOOTH EXTRACTION      Family History  Problem Relation Age of Onset  . Hyperlipidemia Mother   . Hypertension Mother   . Thyroid disease Neg Hx     Medications- reviewed and updated Current Outpatient Medications  Medication Sig Dispense Refill  . Amphet-Dextroamphet 3-Bead ER (MYDAYIS) 37.5 MG CP24 Take 1 capsule by mouth daily.    Marland Kitchen amphetamine-dextroamphetamine (ADDERALL XR) 30 MG 24 hr capsule Take 30 mg by mouth daily.     Francia Greaves THYROID 120 MG tablet Take 120 mg by mouth daily before breakfast.     .  Ascorbic Acid (VITAMIN C) 1000 MG tablet Take 1,000 mg by mouth daily.    . baclofen (LIORESAL) 10 MG tablet Take 10 mg by mouth 3 (three) times daily.    . Cholecalciferol (VITAMIN D3 PO) Take 1 capsule by mouth daily.    . SUMAtriptan (IMITREX) 50 MG tablet Take 1 tablet (50 mg total) by mouth every 2 (two) hours as needed for migraine. May repeat in 2 hours if headache persists or recurs. 90 tablet 3  . tadalafil (CIALIS) 20 MG tablet Take 0.5-1 tablets (10-20 mg total) by mouth every other day as needed for erectile dysfunction. 5 tablet 2  . tamsulosin (FLOMAX) 0.4 MG CAPS capsule Take 0.4 mg by mouth daily.    Marland Kitchen triamcinolone cream (KENALOG) 0.1 % Apply 1 application topically 2 (two) times daily.  (Patient not taking: Reported on 06/11/2020) 30 g 0   No current facility-administered medications for this visit.    Allergies-reviewed and updated Allergies  Allergen Reactions  . Penicillins Anaphylaxis    Did it involve swelling of the face/tongue/throat, SOB, or low BP? Yes Did it involve sudden or severe rash/hives, skin peeling, or any reaction on the inside of your mouth or nose? Yes Did you need to seek medical attention at a hospital or doctor's office? Yes When did it last happen?52-14 years old If all above answers are "NO", may proceed with cephalosporin use.   . Topamax Other (See Comments)    FACE BILATERAL NUMBNESS  . Milk-Related Compounds Other (See Comments)    GI issues Pt reports that he cannot drink milk but can tolerate other dairy products  . Morphine And Related Itching    Full body itching    Social History   Social History Narrative   Married 2016. 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      HH of 1 pets dog and cats.      Objective:  BP (!) 142/96   Pulse 86   Temp 97.6 F (36.4 C) (Temporal)   Ht 6\' 3"  (1.905 m)   Wt (!) 302 lb 12.8 oz (137.3 kg)   SpO2 98%   BMI 37.85 kg/m  Gen: NAD, resting comfortably HEENT: Mucous membranes are moist. Oropharynx normal Neck: no thyromegaly CV: RRR no murmurs rubs or gallops Lungs: CTAB no crackles, wheeze, rhonchi Abdomen: soft/nontender/nondistended/normal bowel sounds. No rebound or guarding. obese Ext: no edema Skin: warm, dry Neuro: grossly normal, moves all extremities, PERRLA     Assessment and Plan:  45 y.o. male presenting for annual physical.  Health Maintenance counseling: 1. Anticipatory guidance: Patient counseled regarding regular dental exams -q6 months, eye exams - yearly,  avoiding smoking and second hand smoke , limiting alcohol to 2 beverages per day- social  occasional.   2. Risk factor reduction:  Advised patient of need for regular exercise and diet rich and fruits and vegetables to reduce risk of heart attack and stroke. Exercise- discussed getting some walking in. Diet-weight from last CPE stable- thinks reducing carbs would help.  Wt Readings from Last 3 Encounters:  06/11/20 (!) 302 lb 12.8 oz (137.3 kg)  03/27/20 (!) 302 lb (137 kg)  03/20/20 300 lb (136.1 kg)  3. Immunizations/screenings/ancillary studies- declines covid vaccine- some natural antibodies likely after prior infection- got tested 2 months ago and still had them. HCV screen opts in .declines flu shot yearl.  Immunization History  Administered Date(s) Administered  . Td 07/01/2001  . Tdap 12/12/2012  4. Prostate cancer screening- no family history, start at age 6  5. Colon cancer screening - no family history, start at age 52 6. Skin cancer screening/prevention- saw dermatology early 2021. advised regular sunscreen use. Denies worrisome, changing, or new skin lesions.  7. Testicular cancer screening- advised monthly self exams  8. STD screening- patient opts out as only with wife 9. Never smoker-   Status of chronic or acute concerns   # Cervical disc surgery upcoming on 07/04/20- he thinks c6-c7 or c5-c6. Due to ongoing numbness in right index finger- occasionally drops objects and hand is weaker. Arthroplasty and microdiscectomy- protruding disc. Prior fusion in lumbar spine. Failed epidural injections  #Rash- starting 3 weeks ago- noted on outer portoin left lower leg and similar on right lower leg and in left lower abdomen- areas on leg small perhaps a few mm erythematous patch, area on abdomen perhaps 2 x 2 cm. Has tried triamcinolone as area is pruritic without relief- did 3-4 x in twice daily regimen consecutively without help. No new meds recently. No change in contacts to skin like detergent, fabric softeners, clothes. No new foods. Does not feel ill overall. Was  somewhat hot over area on abdomen other day but not now.  -unclear origin but no red flags -lets try triamcinolone twice daily for 7 days. He will contact me if not improving through call or mychart and will try halog (wife has used in past and very helpful and he has tried in past as well and very helpful for similar lesion)   #hyperlipidemia S: Medication: none  Lab Results  Component Value Date   CHOL 221 (H) 03/03/2019   HDL 40.00 03/03/2019   LDLCALC 160 (H) 03/03/2019   LDLDIRECT 158.1 07/04/2012   TRIG 104.0 03/03/2019   CHOLHDL 6 03/03/2019   A/P: We will update lipid panel today and calculate 10-year ASCVD risk-do not suspect he will be in statin range  # Depression/ADD S: Medication:[facility]- remains on adderall 30 mg XR and also mydays 37.5 mg together  Depression screen [facility] 2/9 06/11/2020 06/20/2019 06/07/2019  Decreased Interest 0 0 0  Down, Depressed, Hopeless 0 0 0  PHQ - 2 Score 0 0 0  Altered sleeping 1 0 0  Tired, decreased energy 1 0 0  Change in appetite 0 0 0  Feeling bad or failure about yourself  0 0 0  Trouble concentrating 3 0 0  Moving slowly or fidgety/restless 1 0 0  Suicidal thoughts 0 0 0  PHQ-9 Score 6 0 0  Difficult doing work/chores Very difficult Not difficult at all Not difficult at all  Some recent data might be hidden  A/P: depression well controlled without meds. Mom is doing better and less stress on him -not having to see tremor barker anymore with [place]  #Vitamin D deficiency S: Medication:  10k units every day- has bene low in past- takes lower in summer Last vitamin D Lab Results  Component Value Date   VD25OH 83.51 03/03/2019   A/P: hopefully not overtreated- update vitamin D   # migraines - doing really well lately- no refills since 2019. Some headaches with cervical disc issues  #ED- cialis still helpful  # LUTs- on flomax through urology  BP Readings from Last 3 Encounters:  06/11/20 (!) 142/92   03/13/20 (!) 158/103  02/26/20 120/86  Your blood pressure trend concerns me- slightly high  today even on repeat. I would like for you to buy/use a home cuff to check at least 4x a week. Your goal is <135/85 on average- update me in 2-3 weeks with home readings.   Recommended follow up: Return in about 1 year (around 06/11/2021) for physical or sooner if needed.  Lab/Order associations: NOT fasting   ICD-10-CM   1. Preventative health care  Z00.00 CBC with Differential/Platelet    Comprehensive metabolic panel    Lipid panel    Hepatitis C antibody    TSH    VITAMIN D 25 Hydroxy (Vit-D Deficiency, Fractures)  2. Vitamin D deficiency  E55.9 VITAMIN D 25 Hydroxy (Vit-D Deficiency, Fractures)  3. Hyperlipidemia, unspecified hyperlipidemia type  E78.5 CBC with Differential/Platelet    Comprehensive metabolic panel    Lipid panel  4. Major depressive disorder with single episode, in full remission ([place])  F32.5   5. Anxiety state  F41.1   6. Encounter for hepatitis C screening test for low risk patient  Z11.59 Hepatitis C antibody  7. Post-surgical hypothyroidism  E89.0 TSH    No orders of the defined types were placed in this encounter.   Return precautions advised.   Garret Reddish, MD

## 2020-06-11 ENCOUNTER — Other Ambulatory Visit: Payer: Self-pay

## 2020-06-11 ENCOUNTER — Encounter: Payer: Self-pay | Admitting: Family Medicine

## 2020-06-11 ENCOUNTER — Ambulatory Visit (INDEPENDENT_AMBULATORY_CARE_PROVIDER_SITE_OTHER): Payer: Commercial Managed Care - PPO | Admitting: Family Medicine

## 2020-06-11 ENCOUNTER — Other Ambulatory Visit: Payer: Self-pay | Admitting: Otolaryngology

## 2020-06-11 VITALS — BP 142/92 | HR 86 | Temp 97.6°F | Ht 75.0 in | Wt 302.8 lb

## 2020-06-11 DIAGNOSIS — Z0000 Encounter for general adult medical examination without abnormal findings: Secondary | ICD-10-CM | POA: Diagnosis not present

## 2020-06-11 DIAGNOSIS — E785 Hyperlipidemia, unspecified: Secondary | ICD-10-CM

## 2020-06-11 DIAGNOSIS — F325 Major depressive disorder, single episode, in full remission: Secondary | ICD-10-CM | POA: Diagnosis not present

## 2020-06-11 DIAGNOSIS — E559 Vitamin D deficiency, unspecified: Secondary | ICD-10-CM | POA: Diagnosis not present

## 2020-06-11 DIAGNOSIS — E890 Postprocedural hypothyroidism: Secondary | ICD-10-CM

## 2020-06-11 DIAGNOSIS — Z1159 Encounter for screening for other viral diseases: Secondary | ICD-10-CM

## 2020-06-11 DIAGNOSIS — F411 Generalized anxiety disorder: Secondary | ICD-10-CM

## 2020-06-11 DIAGNOSIS — R1310 Dysphagia, unspecified: Secondary | ICD-10-CM

## 2020-06-12 LAB — COMPREHENSIVE METABOLIC PANEL
ALT: 25 U/L (ref 0–53)
AST: 17 U/L (ref 0–37)
Albumin: 4.5 g/dL (ref 3.5–5.2)
Alkaline Phosphatase: 74 U/L (ref 39–117)
BUN: 15 mg/dL (ref 6–23)
CO2: 28 mEq/L (ref 19–32)
Calcium: 9.6 mg/dL (ref 8.4–10.5)
Chloride: 103 mEq/L (ref 96–112)
Creatinine, Ser: 1.05 mg/dL (ref 0.40–1.50)
GFR: 87.64 mL/min (ref >60.00)
Glucose, Bld: 85 mg/dL (ref 70–99)
Potassium: 4.2 mEq/L (ref 3.5–5.1)
Sodium: 140 mEq/L (ref 135–145)
Total Bilirubin: 0.9 mg/dL (ref 0.2–1.2)
Total Protein: 6.8 g/dL (ref 6.0–8.3)

## 2020-06-12 LAB — CBC WITH DIFFERENTIAL/PLATELET
Basophils Absolute: 0.1 10*3/uL (ref 0.0–0.1)
Basophils Relative: 0.7 % (ref 0.0–3.0)
Eosinophils Absolute: 0.1 10*3/uL (ref 0.0–0.7)
Eosinophils Relative: 1.5 % (ref 0.0–5.0)
HCT: 49 % (ref 39.0–52.0)
Hemoglobin: 16.7 g/dL (ref 13.0–17.0)
Lymphocytes Relative: 35.9 % (ref 12.0–46.0)
Lymphs Abs: 2.9 10*3/uL (ref 0.7–4.0)
MCHC: 34.1 g/dL (ref 30.0–36.0)
MCV: 86.8 fl (ref 78.0–100.0)
Monocytes Absolute: 0.8 10*3/uL (ref 0.1–1.0)
Monocytes Relative: 9.9 % (ref 3.0–12.0)
Neutro Abs: 4.1 10*3/uL (ref 1.4–7.7)
Neutrophils Relative %: 52 % (ref 43.0–77.0)
Platelets: 317 10*3/uL (ref 150.0–400.0)
RBC: 5.65 Mil/uL (ref 4.22–5.81)
RDW: 13.7 % (ref 11.5–15.5)
WBC: 8 10*3/uL (ref 4.0–10.5)

## 2020-06-12 LAB — VITAMIN D 25 HYDROXY (VIT D DEFICIENCY, FRACTURES): VITD: 81.16 ng/mL (ref 30.00–100.00)

## 2020-06-12 LAB — TSH: TSH: 0.83 u[IU]/mL (ref 0.35–4.50)

## 2020-06-12 LAB — LIPID PANEL
Cholesterol: 232 mg/dL — ABNORMAL HIGH (ref 0–200)
HDL: 42.3 mg/dL (ref >39.00)
LDL Cholesterol: 165 mg/dL — ABNORMAL HIGH (ref 0–99)
NonHDL: 189.93
Total CHOL/HDL Ratio: 5
Triglycerides: 126 mg/dL (ref 0.0–149.0)
VLDL: 25.2 mg/dL (ref 0.0–40.0)

## 2020-06-12 LAB — HEPATITIS C ANTIBODY
Hepatitis C Ab: NONREACTIVE
SIGNAL TO CUT-OFF: 0.01 (ref <1.00)

## 2020-06-16 ENCOUNTER — Encounter: Payer: Self-pay | Admitting: Family Medicine

## 2020-06-17 ENCOUNTER — Telehealth: Payer: Self-pay

## 2020-06-17 MED ORDER — FLUOCINONIDE 0.05 % EX OINT: 1.0000 "application " | TOPICAL_OINTMENT | Freq: Two times a day (BID) | CUTANEOUS | 0 refills | Status: DC

## 2020-06-17 MED ORDER — HALCINONIDE 0.1 % EX CREA: TOPICAL_CREAM | CUTANEOUS | 0 refills | Status: DC

## 2020-06-17 NOTE — Telephone Encounter (Signed)
I sent in lidex ointment instead- have him check on cost on this since halog not improved

## 2020-06-17 NOTE — Telephone Encounter (Signed)
The halcinonide cream isn't covered by his insurance. Please send in another medication.

## 2020-06-17 NOTE — Telephone Encounter (Signed)
Patient is aware. Gave a verbal understanding.

## 2020-06-26 ENCOUNTER — Other Ambulatory Visit: Payer: Commercial Managed Care - PPO

## 2020-06-27 ENCOUNTER — Other Ambulatory Visit: Payer: Self-pay

## 2020-06-27 ENCOUNTER — Ambulatory Visit
Admission: RE | Admit: 2020-06-27 | Discharge: 2020-06-27 | Disposition: A | Discharge status: Home / Self Care | Payer: Commercial Managed Care - PPO | Source: Ambulatory Visit | Attending: Otolaryngology | Admitting: Otolaryngology

## 2020-06-27 DIAGNOSIS — R1310 Dysphagia, unspecified: Secondary | ICD-10-CM

## 2020-06-27 MED ORDER — IOPAMIDOL (ISOVUE-300) INJECTION 61%
75.0000 mL | Freq: Once | INTRAVENOUS | Status: AC | PRN
  Administered 2020-06-27: 75 mL via INTRAVENOUS

## 2020-07-23 ENCOUNTER — Encounter: Payer: Self-pay | Admitting: Family Medicine

## 2020-07-23 ENCOUNTER — Telehealth (INDEPENDENT_AMBULATORY_CARE_PROVIDER_SITE_OTHER): Payer: Commercial Managed Care - PPO | Admitting: Family Medicine

## 2020-07-23 VITALS — BP 144/89

## 2020-07-23 DIAGNOSIS — R21 Rash and other nonspecific skin eruption: Secondary | ICD-10-CM | POA: Diagnosis not present

## 2020-07-23 NOTE — Patient Instructions (Signed)
Apple cider vinegar once daily topically to the area of concern, apply small amount with tissue or cotton ball  Use Lotrimin antifungal cream twice daily.  Keep feet cool and dry.  Schedule follow-up in person with your doctor in 2 to 3 weeks.     I hope you are feeling better soon!  Seek in person care promptly sooner if your symptoms worsen, new concerns arise or you are not improving with treatment.  It was nice to meet you today. I help [employer] out with telemedicine visits on Tuesdays and Thursdays and am available for visits on those days. If you have any concerns or questions following this visit please schedule a follow up visit with your Primary Care doctor or seek care at a local urgent care clinic to avoid delays in care. '

## 2020-07-23 NOTE — Progress Notes (Signed)
Virtual Visit via Video Note  I connected with Chao  on 07/23/20 at 12:00 PM EDT by a video enabled telemedicine application and verified that I am speaking with the correct person using two identifiers.  Location patient: home, [place] Location provider:work or home office Persons participating in the virtual visit: patient, provider  I discussed the limitations of evaluation and management by telemedicine and the availability of in person appointments. The patient expressed understanding and agreed to proceed.   HPI:  Acute telemedicine visit for Rash on one foot: -Onset: started with dry skin about 1 month ago -Symptoms include: itchy rash on 3-5th toes and plantar aspect of one foot with peeling skin, has noticed thickened toenail on the fifth toe  -Denies: no rash elsewhere, malaise, illness otherwise -Pertinent past medical history: had neck surgery with disc in neck about 3 weeks ago - was on antibiotics with the surgery -Pertinent medication allergies: topamax, penicillins, morphine  ROS: See pertinent positives and negatives per HPI.  Past Medical History:  Diagnosis Date  . ADD (attention deficit disorder) without hyperactivity    according to his mother/ work test possible  . Anxiety   . Chronic sinusitis   . Depression   . Headache   . History of kidney stones    passed stone  . Hyperlipidemia    no meds, diet controlled  . Hypertension    recently dx 2 months ago, MD just monitoring, no meds at this time  . Hypothyroidism   . Sleep apnea    Uses CPAP nightly.    Past Surgical History:  Procedure Laterality Date  . HERNIA REPAIR     pt. was an infant when this surgery occured  . NASAL SINUS SURGERY    . THYROID SURGERY Right 02/28/2014   DR TEOH    PARTIAL THYROIDECTOMY  . THYROIDECTOMY Right 02/28/2014   Procedure: RIGHT HEMI THYROIDECTOMY;  Surgeon: Ascencion Dike, MD;  Location: [facility];  Service: ENT;  Laterality: Right;  . WISDOM TOOTH EXTRACTION        Current Outpatient Medications:  .  Amphet-Dextroamphet 3-Bead ER (MYDAYIS) 37.5 MG CP24, Take 1 capsule by mouth daily., Disp: , Rfl:  .  amphetamine-dextroamphetamine (ADDERALL XR) 30 MG 24 hr capsule, Take 30 mg by mouth daily. , Disp: , Rfl:  .  ARMOUR THYROID 120 MG tablet, Take 120 mg by mouth daily before breakfast. , Disp: , Rfl:  .  Ascorbic Acid (VITAMIN C) 1000 MG tablet, Take 1,000 mg by mouth daily., Disp: , Rfl:  .  baclofen (LIORESAL) 10 MG tablet, Take 10 mg by mouth 3 (three) times daily., Disp: , Rfl:  .  Cholecalciferol (VITAMIN D3 PO), Take 1 capsule by mouth daily., Disp: , Rfl:  .  fluocinonide ointment (LIDEX) 1.02 %, Apply 1 application topically 2 (two) times daily. For up to 10 days to spots on skin, Disp: 30 g, Rfl: 0 .  SUMAtriptan (IMITREX) 50 MG tablet, Take 1 tablet (50 mg total) by mouth every 2 (two) hours as needed for migraine. May repeat in 2 hours if headache persists or recurs., Disp: 90 tablet, Rfl: 3 .  tadalafil (CIALIS) 20 MG tablet, Take 0.5-1 tablets (10-20 mg total) by mouth every other day as needed for erectile dysfunction., Disp: 5 tablet, Rfl: 2 .  tamsulosin (FLOMAX) 0.4 MG CAPS capsule, Take 0.4 mg by mouth daily., Disp: , Rfl:  .  triamcinolone cream (KENALOG) 0.1 %, Apply 1 application topically 2 (two) times daily.,  Disp: 30 g, Rfl: 0  EXAM:  VITALS per patient if applicable:  GENERAL: alert, oriented, appears well and in no acute distress  HEENT: atraumatic, conjunttiva clear, no obvious abnormalities on inspection of external nose and ears  NECK: normal movements of the head and neck  LUNGS: on inspection no signs of respiratory distress, breathing rate appears normal, no obvious gross SOB, gasping or wheezing  SKIN: on video visit exam has some peeling of the skin on the plantar aspect of the foot involved without edema or erythema, mild thickening of nail on 5th digit without apparent friability or discoloration on limited  video visit ecam  CV: no obvious cyanosis  MS: moves all visible extremities without noticeable abnormality  PSYCH/NEURO: pleasant and cooperative, no obvious depression or anxiety, speech and thought processing grossly intact  ASSESSMENT AND PLAN:  Discussed the following assessment and plan:  Skin rash  -we discussed possible serious and likely etiologies, options for evaluation and workup, limitations of telemedicine visit vs in person visit, treatment, treatment risks and precautions. Pt prefers to treat via telemedicine empirically rather than in person at this moment.  From his description, and limited exam over the video visit today, suspect fungal skin infection.  On limited exam of the toenail, query changes from shoe pressure versus possible fungal infection versus other.  Opted for trial of topical antifungal twice daily, keeping feet dry and cool when able, possible apple cider vinegar 1-2 times daily topically and follow-up in person in 2 to 3 weeks. Scheduled follow up with PCP offered: Sent message to schedulers to assist and advised patient to contact PCP office to schedule if does not receive call back in next 24 hours. Advised to seek prompt in person care if worsening, new symptoms arise, or if is not improving with treatment. Discussed options for inperson care if PCP office not available. Did let this patient know that I only do telemedicine on Tuesdays and Thursdays for [place]. Advised to schedule follow up visit with PCP or [facility] if any further questions or concerns to avoid delays in care.   I discussed the assessment and treatment plan with the patient. The patient was provided an opportunity to ask questions and all were answered. The patient agreed with the plan and demonstrated an understanding of the instructions.     Lucretia Kern, DO

## 2020-07-23 NOTE — Progress Notes (Signed)
LVM asking patient call back

## 2020-08-05 ENCOUNTER — Encounter: Payer: Self-pay | Admitting: Family Medicine

## 2020-08-06 ENCOUNTER — Telehealth (INDEPENDENT_AMBULATORY_CARE_PROVIDER_SITE_OTHER): Payer: Commercial Managed Care - PPO | Admitting: Family Medicine

## 2020-08-06 ENCOUNTER — Other Ambulatory Visit (HOSPITAL_COMMUNITY): Payer: Self-pay

## 2020-08-06 ENCOUNTER — Telehealth: Payer: Self-pay | Admitting: *Deleted

## 2020-08-06 ENCOUNTER — Encounter: Payer: Self-pay | Admitting: Family Medicine

## 2020-08-06 VITALS — Temp 97.5°F

## 2020-08-06 DIAGNOSIS — U071 COVID-19: Secondary | ICD-10-CM | POA: Diagnosis not present

## 2020-08-06 MED ORDER — NIRMATRELVIR/RITONAVIR (PAXLOVID)TABLET
3.0000 | ORAL_TABLET | Freq: Two times a day (BID) | ORAL | 0 refills | Status: AC
  Filled 2020-08-06: qty 30, 5d supply, fill #0

## 2020-08-06 NOTE — Telephone Encounter (Signed)
Per Dr Maudie Mercury, I called the pt and informed him he is OK to hold his Amphetamines and Flomax while taking Paxlovid and to go an urgent care or emergency room if he is worsening.  Patient agreed.

## 2020-08-06 NOTE — Telephone Encounter (Signed)
Per Dr Maudie Mercury, I spoke with Luetta Nutting, pharmacist at [facility] and informed her the patient wants to use Paxlovid and is high risk. Dr Maudie Mercury stated the pt is on Amphetamines, however did not take them today and easily goes without them at times per his report so is ok with holding. stopped for 2 weeks recently without issues and he also could hold the Flomax. Dr Maudie Mercury wanted to know if this would  be ok and for how long would he need to hold?  Amber questioned the results of the GFR, was advised of the  GFR of 87.64 on 3/15 and stated the pt would be Ok to hold the medications while on Paxlovid and not past the date.  Amber stated to send the Rx over, enter this on the Rx and Dr Maudie Mercury was informed.

## 2020-08-06 NOTE — Patient Instructions (Addendum)
---------------------------------------------------------------------------------------------------------------------------    WORK SLIP:  Patient Robert [name],  Jul 22, 1975, was seen for a medical visit today, 08/06/20 . Please excuse from work for a COVID like illness. We advise 10 days minimum from the onset of symptoms (08/03/20) PLUS 1 day of no fever and improved symptoms. Will defer to employer for a sooner return to work if symptoms have resolved, it is greater than 5 days since the positive test and the patient can wear a high-quality, tight fitting mask such as N95 or KN95 at all times for an additional 5 days. Would also suggest COVID19 antigen testing is negative prior to return.  Sincerely: E-signature: Dr. Colin Benton, DO [facility] [place] Ph: [phone]   ------------------------------------------------------------------------------------------------------------------------------  HOME CARE TIPS:  -I sent the medication(s) we discussed to your pharmacy: Meds ordered this encounter  Medications  . nirmatrelvir/ritonavir EUA (PAXLOVID) TABS    Sig: Take 3 tablets by mouth 2 (two) times daily for 5 days.    Dispense:  30 tablet    Refill:  0    Patient to hold medications that interact - amphetamines and flomax     -I sent in the [facility] treatment or referral you requested per our discussion. Please see the information provided below and discuss further with the pharmacist/treatment team. You have opted to hold the amphetamine drugs and the flomax and the pharmacist has agreed with this plan. Please seek immediate inperson medical care if any worsening or concerns with taking this treatment or with stopping your other medications.   -can use nasal saline a few times per day if you have nasal congestion; sometimes  a short course of Afrin nasal spray for 3 days can help with symptoms as well  -stay hydrated, drink plenty of fluids and eat small  healthy meals - avoid dairy  -If the Covid test is positive, check out the [facility] website for more information on home care, transmission and treatment for COVID19  -follow up with your doctor in 2-3 days unless improving and feeling better  -stay home while sick, except to seek medical care. If you have COVID19, ideally it would be best to stay home for a full 10 days since the onset of symptoms PLUS one day of no fever and feeling better. Wear a good mask that fits snugly (such as N95 or KN95) if around others to reduce the risk of transmission.  It was nice to meet you today, and I really hope you are feeling better soon. I help [facility] out with telemedicine visits on Tuesdays and Thursdays and am available for visits on those days. If you have any concerns or questions following this visit please schedule a follow up visit with your Primary Care doctor or seek care at a local urgent care clinic to avoid delays in care.    Seek in person care or schedule a follow up video visit promptly if your symptoms worsen, new concerns arise or you are not improving with treatment. Call 911 and/or seek emergency care if your symptoms are severe or life threatening.  FACT SHEET FOR PATIENTS, PARENTS, AND CAREGIVERS EMERGENCY USE AUTHORIZATION (EUA) OF PAXLOVID FOR CORONAVIRUS DISEASE 2019 (COVID-19) You are being given this Fact Sheet because your healthcare provider believes it is necessary to provide you with PAXLOVID for the treatment of mild-to-moderate coronavirus disease (COVID-19) caused by the SARS-CoV-2 virus. This Fact Sheet contains information to help you understand the risks and benefits of taking the PAXLOVID you  have received or may receive. The U.S. Food and Drug Administration (FDA) has issued an Emergency Use Authorization (EUA) to make PAXLOVID available during the COVID-19 pandemic (for more details about an EUA please see "What is an Emergency Use Authorization?" at the end of this  document). PAXLOVID is not an FDA-approved medicine in the [place]. Read this Fact Sheet for information about PAXLOVID. Talk to your healthcare provider about your options or if you have any questions. It is your choice to take PAXLOVID.  What is COVID-19? COVID-19 is caused by a virus called a coronavirus. You can get COVID-19 through close contact with another person who has the virus. COVID-19 illnesses have ranged from very mild-to-severe, including illness resulting in death. While information so far suggests that most COVID-19 illness is mild, serious illness can happen and may cause some of your other medical conditions to become worse. Older people and people of all ages with severe, long lasting (chronic) medical conditions like heart disease, lung disease, and diabetes, for example seem to be at higher risk of being hospitalized for COVID-19.  What is PAXLOVID? PAXLOVID is an investigational medicine used to treat mild-to-moderate COVID-19 in adults and children [45 years of age and older weighing at least 43 pounds (42 kg)] with positive results of direct SARS-CoV-2 viral testing, and who are at high risk for progression to severe COVID-19, including hospitalization or death. PAXLOVID is investigational because it is still being studied. There is limited information about the safety and effectiveness of using PAXLOVID to treat people with mild-to-moderate COVID-19.  The FDA has authorized the emergency use of PAXLOVID for the treatment of mild-tomoderate COVID-19 in adults and children [35 years of age and older weighing at least 30 pounds (59 kg)] with a positive test for the virus that causes COVID-19, and who are at high risk for progression to severe COVID-19, including hospitalization or death, under an EUA. 1 Revised: 14 June 2020   What should I tell my healthcare provider before I take PAXLOVID? Tell your healthcare provider if you: . Have any allergies .  Have liver or kidney disease . Are pregnant or plan to become pregnant . Are breastfeeding a child . Have any serious illnesses  Tell your healthcare provider about all the medicines you take, including prescription and over-the-counter medicines, vitamins, and herbal supplements. Some medicines may interact with PAXLOVID and may cause serious side effects. Keep a list of your medicines to show your healthcare provider and pharmacist when you get a new medicine.  You can ask your healthcare provider or pharmacist for a list of medicines that interact with PAXLOVID. Do not start taking a new medicine without telling your healthcare provider. Your healthcare provider can tell you if it is safe to take PAXLOVID with other medicines.  Tell your healthcare provider if you are taking combined hormonal contraceptive. PAXLOVID may affect how your birth control pills work. Females who are able to become pregnant should use another effective alternative form of contraception or an additional barrier method of contraception. Talk to your healthcare provider if you have any questions about contraceptive methods that might be right for you.  How do I take PAXLOVID? Marland Kitchen PAXLOVID consists of 2 medicines: nirmatrelvir and ritonavir. o Take 2 pink tablets of nirmatrelvir with 1 white tablet of ritonavir by mouth 2 times each day (in the morning and in the evening) for 5 days. For each dose, take all 3 tablets at the same time. o If you have  kidney disease, talk to your healthcare provider. You may need a different dose. Ricka Burdock the tablets whole. Do not chew, break, or crush the tablets. . Take PAXLOVID with or without food. . Do not stop taking PAXLOVID without talking to your healthcare provider, even if you feel better. . If you miss a dose of PAXLOVID within 8 hours of the time it is usually taken, take it as soon as you remember. If you miss a dose by more than 8 hours, skip the missed dose  and take the next dose at your regular time. Do not take 2 doses of PAXLOVID at the same time. . If you take too much PAXLOVID, call your healthcare provider or go to the nearest hospital emergency room right away. . If you are taking a ritonavir- or cobicistat-containing medicine to treat hepatitis C or Human Immunodeficiency Virus (HIV), you should continue to take your medicine as prescribed by your healthcare provider. 2 Revised: 14 June 2020    Talk to your healthcare provider if you do not feel better or if you feel worse after 5 days.  Who should generally not take PAXLOVID? Do not take PAXLOVID if: . You are allergic to nirmatrelvir, ritonavir, or any of the ingredients in PAXLOVID. Marland Kitchen You are taking any of the following medicines: o Alfuzosin o Pethidine, propoxyphene o Ranolazine o Amiodarone, dronedarone, flecainide, propafenone, quinidine o Colchicine o Lurasidone, pimozide, clozapine o Dihydroergotamine, ergotamine, methylergonovine o Lovastatin, simvastatin o Sildenafil (Revatio) for pulmonary arterial hypertension (PAH) o Triazolam, oral midazolam o Apalutamide o Carbamazepine, phenobarbital, phenytoin o Rifampin o St. John's Wort (hypericum perforatum) Taking PAXLOVID with these medicines may cause serious or life-threatening side effects or affect how PAXLOVID works.  These are not the only medicines that may cause serious side effects if taken with PAXLOVID. PAXLOVID may increase or decrease the levels of multiple other medicines. It is very important to tell your healthcare provider about all of the medicines you are taking because additional laboratory tests or changes in the dose of your other medicines may be necessary while you are taking PAXLOVID. Your healthcare provider may also tell you about specific symptoms to watch out for that may indicate that you need to stop or decrease the dose of some of your other medicines.  What are the important  possible side effects of PAXLOVID? Possible side effects of PAXLOVID are: . Allergic Reactions. Allergic reactions can happen in people taking PAXLOVID, even after only 1 dose. Stop taking PAXLOVID and call your healthcare provider right away if you get any of the following symptoms of an allergic reaction: o hives o trouble swallowing or breathing o swelling of the mouth, lips, or face o throat tightness o hoarseness 3 Revised: 14 June 2020  o skin rash . Liver Problems. Tell your healthcare provider right away if you have any of these signs and symptoms of liver problems: loss of appetite, yellowing of your skin and the whites of eyes (jaundice), dark-colored urine, pale colored stools and itchy skin, stomach area (abdominal) pain. Marland Kitchen Resistance to HIV Medicines. If you have untreated HIV infection, PAXLOVID may lead to some HIV medicines not working as well in the future. . Other possible side effects include: o altered sense of taste o diarrhea o high blood pressure o muscle aches These are not all the possible side effects of PAXLOVID. Not many people have taken PAXLOVID. Serious and unexpected side effects may happen. PAXLOVID is still being studied, so it is possible that  all of the risks are not known at this time.  What other treatment choices are there? Veklury (remdesivir) is FDA-approved for the treatment of mild-to-moderate FKCLE-75 in certain adults and children. Talk with your doctor to see if Marijean Heath is appropriate for you. Like PAXLOVID, FDA may also allow for the emergency use of other medicines to treat people with COVID-19. Go to https://price.info/ for information on the emergency use of other medicines that are authorized by FDA to treat people with COVID-19. Your healthcare provider may talk with you about clinical trials for which you may be  eligible. It is your choice to be treated or not to be treated with PAXLOVID. Should you decide not to receive it or for your child not to receive it, it will not change your standard medical care.  What if I am pregnant or breastfeeding? There is no experience treating pregnant women or breastfeeding mothers with PAXLOVID. For a mother and unborn baby, the benefit of taking PAXLOVID may be greater than the risk from the treatment. If you are pregnant, discuss your options and specific situation with your healthcare provider. It is recommended that you use effective barrier contraception or do not have sexual activity while taking PAXLOVID. If you are breastfeeding, discuss your options and specific situation with your healthcare provider. 4 Revised: 14 June 2020   How do I report side effects with PAXLOVID? Contact your healthcare provider if you have any side effects that bother you or do not go away. Report side effects to FDA MedWatch at SmoothHits.hu or call 1-800-FDA1088 or you can report side effects to [employer]. at the contact information provided below. Website Fax number Telephone number www.pfizersafetyreporting.com [phone] How should I store [place]? Store PAXLOVID tablets at room temperature, between 68?F to 77?F (20?C to 25?C). How can I learn more about COVID-19? Marland Kitchen Ask your healthcare provider. . Visit [email or web address] Minette Brine your local or state public health department. What is an Emergency Use Authorization (EUA)? The [place] FDA has made PAXLOVID available under an emergency access mechanism called an Emergency Use Authorization (EUA). The EUA is supported by a Education officer, museum and Human Service (HHS) declaration that circumstances exist to justify the emergency use of drugs and biological products during the COVID-19 pandemic. PAXLOVID for the treatment of mild-to-moderate COVID-19 in adults and  children [92 years of age and older weighing at least 40 pounds (52 kg)] with positive results of direct SARS-CoV-2 viral testing, and who are at high risk for progression to severe COVID-19, including hospitalization or death, has not undergone the same type of review as an FDA-approved product. In issuing an EUA under the ZLDJT-70 public health emergency, the FDA has determined, among other things, that based on the total amount of scientific evidence available including data from adequate and well-controlled clinical trials, if available, it is reasonable to believe that the product may be effective for diagnosing, treating, or preventing COVID-19, or a serious or life-threatening disease or condition caused by COVID-19; that the known and potential benefits of the product, when used to diagnose, treat, or prevent such disease or condition, outweigh the known and potential risks of such product; and that there are no adequate, approved, and available alternatives. All of these criteria must be met to allow for the product to be used in the treatment of patients during the COVID-19 pandemic. The EUA for PAXLOVID is in effect for the duration of the COVID-19 declaration justifying emergency use of this product, unless terminated  or revoked (after which the products may no longer be used under the EUA). 5 Revised: 14 June 2020     Additional Information For general questions, visit the website or call the telephone number provided below. Website Telephone number www.COVID19oralRx.com [phone] (1-877-C19-PACK) You can also go to www.pfizermedinfo.com or call [phone] for more information. [id number] Revised: 14 June 2020  with PAXLOVID for the treatment of mild-to-moderate coronavirus disease (COVID-19) caused by the SARS-CoV-2 virus. This Fact Sheet contains information to help you understand the risks and benefits of taking the PAXLOVID you have received or may  receive. The U.S. Food and Drug Administration (FDA) has issued an Emergency Use Authorization (EUA) to make PAXLOVID available during the COVID-19 pandemic (for more details about an EUA please see "What is an Emergency Use Authorization?" at the end of this document). PAXLOVID is not an FDA-approved medicine in the [place]. Read this Fact Sheet for information about PAXLOVID. Talk to your healthcare provider about your options or if you have any questions. It is your choice to take PAXLOVID.  What is COVID-19? COVID-19 is caused by a virus called a coronavirus. You can get COVID-19 through close contact with another person who has the virus. COVID-19 illnesses have ranged from very mild-to-severe, including illness resulting in death. While information so far suggests that most COVID-19 illness is mild, serious illness can happen and may cause some of your other medical conditions to become worse. Older people and people of all ages with severe, long lasting (chronic) medical conditions like heart disease, lung disease, and diabetes, for example seem to be at higher risk of being hospitalized for COVID-19.  What is PAXLOVID? PAXLOVID is an investigational medicine used to treat mild-to-moderate COVID-19 in adults and children [83 years of age and older weighing at least 38 pounds (3 kg)] with positive results of direct SARS-CoV-2 viral testing, and who are at high risk for progression to severe COVID-19, including hospitalization or death. PAXLOVID is investigational because it is still being studied. There is limited information about the safety and effectiveness of using PAXLOVID to treat people with mild-to-moderate COVID-19.  The FDA has authorized the emergency use of PAXLOVID for the treatment of mild-tomoderate COVID-19 in adults and children [39 years of age and older weighing at least 79 pounds (19 kg)] with a positive test for the virus that causes COVID-19, and who  are at high risk for progression to severe COVID-19, including hospitalization or death, under an EUA. 1 Revised: 14 June 2020   What should I tell my healthcare provider before I take PAXLOVID? Tell your healthcare provider if you: . Have any allergies . Have liver or kidney disease . Are pregnant or plan to become pregnant . Are breastfeeding a child . Have any serious illnesses  Tell your healthcare provider about all the medicines you take, including prescription and over-the-counter medicines, vitamins, and herbal supplements. Some medicines may interact with PAXLOVID and may cause serious side effects. Keep a list of your medicines to show your healthcare provider and pharmacist when you get a new medicine.  You can ask your healthcare provider or pharmacist for a list of medicines that interact with PAXLOVID. Do not start taking a new medicine without telling your healthcare provider. Your healthcare provider can tell you if it is safe to take PAXLOVID with other medicines.  Tell your healthcare provider if you are taking combined hormonal contraceptive. PAXLOVID may affect how your birth control pills work. Females who are able to  become pregnant should use another effective alternative form of contraception or an additional barrier method of contraception. Talk to your healthcare provider if you have any questions about contraceptive methods that might be right for you.  How do I take PAXLOVID? Marland Kitchen PAXLOVID consists of 2 medicines: nirmatrelvir and ritonavir. o Take 2 pink tablets of nirmatrelvir with 1 white tablet of ritonavir by mouth 2 times each day (in the morning and in the evening) for 5 days. For each dose, take all 3 tablets at the same time. o If you have kidney disease, talk to your healthcare provider. You may need a different dose. Ricka Burdock the tablets whole. Do not chew, break, or crush the tablets. . Take PAXLOVID with or without food. . Do not stop  taking PAXLOVID without talking to your healthcare provider, even if you feel better. . If you miss a dose of PAXLOVID within 8 hours of the time it is usually taken, take it as soon as you remember. If you miss a dose by more than 8 hours, skip the missed dose and take the next dose at your regular time. Do not take 2 doses of PAXLOVID at the same time. . If you take too much PAXLOVID, call your healthcare provider or go to the nearest hospital emergency room right away. . If you are taking a ritonavir- or cobicistat-containing medicine to treat hepatitis C or Human Immunodeficiency Virus (HIV), you should continue to take your medicine as prescribed by your healthcare provider. 2 Revised: 14 June 2020    Talk to your healthcare provider if you do not feel better or if you feel worse after 5 days.  Who should generally not take PAXLOVID? Do not take PAXLOVID if: . You are allergic to nirmatrelvir, ritonavir, or any of the ingredients in PAXLOVID. Marland Kitchen You are taking any of the following medicines: o Alfuzosin o Pethidine, propoxyphene o Ranolazine o Amiodarone, dronedarone, flecainide, propafenone, quinidine o Colchicine o Lurasidone, pimozide, clozapine o Dihydroergotamine, ergotamine, methylergonovine o Lovastatin, simvastatin o Sildenafil (Revatio) for pulmonary arterial hypertension (PAH) o Triazolam, oral midazolam o Apalutamide o Carbamazepine, phenobarbital, phenytoin o Rifampin o St. John's Wort (hypericum perforatum) Taking PAXLOVID with these medicines may cause serious or life-threatening side effects or affect how PAXLOVID works.  These are not the only medicines that may cause serious side effects if taken with PAXLOVID. PAXLOVID may increase or decrease the levels of multiple other medicines. It is very important to tell your healthcare provider about all of the medicines you are taking because additional laboratory tests or changes in the dose of your  other medicines may be necessary while you are taking PAXLOVID. Your healthcare provider may also tell you about specific symptoms to watch out for that may indicate that you need to stop or decrease the dose of some of your other medicines.  What are the important possible side effects of PAXLOVID? Possible side effects of PAXLOVID are: . Allergic Reactions. Allergic reactions can happen in people taking PAXLOVID, even after only 1 dose. Stop taking PAXLOVID and call your healthcare provider right away if you get any of the following symptoms of an allergic reaction: o hives o trouble swallowing or breathing o swelling of the mouth, lips, or face o throat tightness o hoarseness 3 Revised: 14 June 2020  o skin rash . Liver Problems. Tell your healthcare provider right away if you have any of these signs and symptoms of liver problems: loss of appetite, yellowing of your skin and  the whites of eyes (jaundice), dark-colored urine, pale colored stools and itchy skin, stomach area (abdominal) pain. Marland Kitchen Resistance to HIV Medicines. If you have untreated HIV infection, PAXLOVID may lead to some HIV medicines not working as well in the future. . Other possible side effects include: o altered sense of taste o diarrhea o high blood pressure o muscle aches These are not all the possible side effects of PAXLOVID. Not many people have taken PAXLOVID. Serious and unexpected side effects may happen. PAXLOVID is still being studied, so it is possible that all of the risks are not known at this time.  What other treatment choices are there? Veklury (remdesivir) is FDA-approved for the treatment of mild-to-moderate RAQTM-22 in certain adults and children. Talk with your doctor to see if Marijean Heath is appropriate for you. Like PAXLOVID, FDA may also allow for the emergency use of other medicines to treat people with COVID-19. Go to  https://price.info/ for information on the emergency use of other medicines that are authorized by FDA to treat people with COVID-19. Your healthcare provider may talk with you about clinical trials for which you may be eligible. It is your choice to be treated or not to be treated with PAXLOVID. Should you decide not to receive it or for your child not to receive it, it will not change your standard medical care.  What if I am pregnant or breastfeeding? There is no experience treating pregnant women or breastfeeding mothers with PAXLOVID. For a mother and unborn baby, the benefit of taking PAXLOVID may be greater than the risk from the treatment. If you are pregnant, discuss your options and specific situation with your healthcare provider. It is recommended that you use effective barrier contraception or do not have sexual activity while taking PAXLOVID. If you are breastfeeding, discuss your options and specific situation with your healthcare provider. 4 Revised: 14 June 2020   How do I report side effects with PAXLOVID? Contact your healthcare provider if you have any side effects that bother you or do not go away. Report side effects to FDA MedWatch at SmoothHits.hu or call 1-800-FDA1088 or you can report side effects to [employer]. at the contact information provided below. Website Fax number Telephone number www.pfizersafetyreporting.com [phone] How should I store Housatonic? Store PAXLOVID tablets at room temperature, between 68?F to 77?F (20?C to 25?C). How can I learn more about COVID-19? Marland Kitchen Ask your healthcare provider. . Visit [email or web address] Minette Brine your local or state public health department. What is an Emergency Use Authorization (EUA)? The [place] FDA has made PAXLOVID available under an emergency access mechanism  called an Emergency Use Authorization (EUA). The EUA is supported by a Education officer, museum and Human Service (HHS) declaration that circumstances exist to justify the emergency use of drugs and biological products during the COVID-19 pandemic. PAXLOVID for the treatment of mild-to-moderate COVID-19 in adults and children [58 years of age and older weighing at least 73 pounds (54 kg)] with positive results of direct SARS-CoV-2 viral testing, and who are at high risk for progression to severe COVID-19, including hospitalization or death, has not undergone the same type of review as an FDA-approved product. In issuing an EUA under the OTLXB-26 public health emergency, the FDA has determined, among other things, that based on the total amount of scientific evidence available including data from adequate and well-controlled clinical trials, if available, it is reasonable to believe that the product may be effective for diagnosing, treating, or preventing COVID-19, or  a serious or life-threatening disease or condition caused by COVID-19; that the known and potential benefits of the product, when used to diagnose, treat, or prevent such disease or condition, outweigh the known and potential risks of such product; and that there are no adequate, approved, and available alternatives. All of these criteria must be met to allow for the product to be used in the treatment of patients during the COVID-19 pandemic. The EUA for PAXLOVID is in effect for the duration of the COVID-19 declaration justifying emergency use of this product, unless terminated or revoked (after which the products may no longer be used under the EUA). 5 Revised: 14 June 2020     Additional Information For general questions, visit the website or call the telephone number provided below. Website Telephone number www.COVID19oralRx.com [phone] (1-877-C19-PACK) You can also go to [email or web address].com or call  [phone] for more information. YWV-3710-6.2 Revised: 14 June 2020

## 2020-08-06 NOTE — Progress Notes (Signed)
Virtual Visit via Video Note  I connected with Robert [name]  on 08/06/20 at 11:20 AM EDT by a video enabled telemedicine application and verified that I am speaking with the correct person using two identifiers.  Location patient: home, [place] Location provider:work or home office Persons participating in the virtual visit: patient, provider  I discussed the limitations of evaluation and management by telemedicine and the availability of in person appointments. The patient expressed understanding and agreed to proceed.   HPI:  Acute telemedicine visit for COVID19: -Onset: 2-3 days ago, covid test was negative initially -positive covid test yesterday per patient -Symptoms include: cough, congestion, fever, body aches, HA, feels tired -Denies: CP, SOB, NVD, inability to eat/drink/get out of bed -Pertinent past medical history: HTN, obesity -Pertinent medication allergies: -COVID-19 vaccine status: not vaccinated -he didn't take his amphetamines today, reports he easily holds his amphetamines on weekends and held for 2 weeks recently for neck surgery without any issues -he really wants to take paxlovid  ROS: See pertinent positives and negatives per HPI.  Past Medical History:  Diagnosis Date  . ADD (attention deficit disorder) without hyperactivity    according to his mother/ work test possible  . Anxiety   . Chronic sinusitis   . Depression   . Headache   . History of kidney stones    passed stone  . Hyperlipidemia    no meds, diet controlled  . Hypertension    recently dx 2 months ago, MD just monitoring, no meds at this time  . Hypothyroidism   . Sleep apnea    Uses CPAP nightly.    Past Surgical History:  Procedure Laterality Date  . HERNIA REPAIR     pt. was an infant when this surgery occured  . NASAL SINUS SURGERY    . THYROID SURGERY Right 02/28/2014   DR TEOH    PARTIAL THYROIDECTOMY  . THYROIDECTOMY Right 02/28/2014   Procedure: RIGHT HEMI THYROIDECTOMY;  Surgeon:  Ascencion Dike, MD;  Location: [facility];  Service: ENT;  Laterality: Right;  . WISDOM TOOTH EXTRACTION       Current Outpatient Medications:  .  Amphet-Dextroamphet 3-Bead ER (MYDAYIS) 37.5 MG CP24, Take 1 capsule by mouth daily., Disp: , Rfl:  .  amphetamine-dextroamphetamine (ADDERALL XR) 30 MG 24 hr capsule, Take 30 mg by mouth daily. , Disp: , Rfl:  .  ARMOUR THYROID 120 MG tablet, Take 120 mg by mouth daily before breakfast. , Disp: , Rfl:  .  Ascorbic Acid (VITAMIN C) 1000 MG tablet, Take 1,000 mg by mouth daily., Disp: , Rfl:  .  baclofen (LIORESAL) 10 MG tablet, Take 10 mg by mouth 3 (three) times daily., Disp: , Rfl:  .  Cholecalciferol (VITAMIN D3 PO), Take 1 capsule by mouth daily., Disp: , Rfl:  .  nirmatrelvir/ritonavir EUA (PAXLOVID) TABS, Take 3 tablets by mouth 2 (two) times daily for 5 days. Patient GFR is 87.64 in last 3 months. Take nirmatrelvir (150 mg) 2 tablet(s) twice daily for 5 days and ritonavir (100 mg) one tablet twice daily for 5 days., Disp: 30 tablet, Rfl: 0 .  SUMAtriptan (IMITREX) 50 MG tablet, Take 1 tablet (50 mg total) by mouth every 2 (two) hours as needed for migraine. May repeat in 2 hours if headache persists or recurs., Disp: 90 tablet, Rfl: 3 .  tadalafil (CIALIS) 20 MG tablet, Take 0.5-1 tablets (10-20 mg total) by mouth every other day as needed for erectile dysfunction., Disp: 5 tablet, Rfl: 2 .  tamsulosin (FLOMAX) 0.4 MG CAPS capsule, Take 0.4 mg by mouth daily., Disp: , Rfl:  .  triamcinolone cream (KENALOG) 0.1 %, Apply 1 application topically 2 (two) times daily., Disp: 30 g, Rfl: 0  EXAM:  VITALS per patient if applicable:  GENERAL: alert, oriented, appears well and in no acute distress  HEENT: atraumatic, conjunttiva clear, no obvious abnormalities on inspection of external nose and ears  NECK: normal movements of the head and neck  LUNGS: on inspection no signs of respiratory distress, breathing rate appears normal, no obvious gross SOB,  gasping or wheezing  CV: no obvious cyanosis  MS: moves all visible extremities without noticeable abnormality  PSYCH/NEURO: pleasant and cooperative, no obvious depression or anxiety, speech and thought processing grossly intact  ASSESSMENT AND PLAN:  Discussed the following assessment and plan:  COVID-19  -we discussed possible serious and likely etiologies, options for evaluation and workup, limitations of telemedicine visit vs in person visit, treatment, treatment risks and precautions. Pt prefers to treat via telemedicine empirically rather than in person at this moment.  Discussed treatment options, ideal treatment window, potential complications, isolation and precautions for COVID-19.   The patient opted for treatment with Paxlovid due to being higher risk for complications of covid or severe disease. Discussed EUA status limited knowledge of risks/interactions/side effects per EUA document vs possible benefits and precautions share with patient and provided in patient instructions. Patient is on several medication which per [facility] interaction check have low risk of interaction with Paxlovid. Patient wishes to hold these during course of paxlovid as reports he easily and recently has held his amphetamines and often misses days without use. Staff contacted [facility] pharmacist, Museum/gallery conservator at [employer] to advise and she agreed with this plan and okd the Rx. Pt aware of risks and withdrawal risks. Also, notified PCP.  Also, advised that patient discuss risks/interactions and use with pharmacist/treatment team as well.  Work/School slipped offered:  Provided in patient instructions. Scheduled follow up with PCP offered: follow up as needed Advised to seek prompt in person care if worsening, new symptoms arise, or if is not improving with treatment. Discussed options for inperson care if PCP office not available. Did let this patient know that I only do telemedicine on Tuesdays and Thursdays for  [place]. Advised to schedule follow up visit with PCP or [facility] if any further questions or concerns to avoid delays in care.   I discussed the assessment and treatment plan with the patient. > 30 minute spent on this visit. The patient was provided an opportunity to ask questions and all were answered. The patient agreed with the plan and demonstrated an understanding of the instructions.     Lucretia Kern, DO

## 2020-08-19 ENCOUNTER — Emergency Department (INDEPENDENT_AMBULATORY_CARE_PROVIDER_SITE_OTHER): Payer: Commercial Managed Care - PPO

## 2020-08-19 ENCOUNTER — Emergency Department (INDEPENDENT_AMBULATORY_CARE_PROVIDER_SITE_OTHER)
Admission: RE | Admit: 2020-08-19 | Discharge: 2020-08-19 | Disposition: A | Discharge status: Home / Self Care | Payer: Commercial Managed Care - PPO | Source: Ambulatory Visit

## 2020-08-19 ENCOUNTER — Telehealth: Payer: Self-pay

## 2020-08-19 ENCOUNTER — Other Ambulatory Visit: Payer: Self-pay

## 2020-08-19 VITALS — BP 152/95 | HR 78 | Temp 98.2°F | Resp 20 | Ht 75.0 in | Wt 304.0 lb

## 2020-08-19 DIAGNOSIS — R058 Other specified cough: Secondary | ICD-10-CM

## 2020-08-19 DIAGNOSIS — J309 Allergic rhinitis, unspecified: Secondary | ICD-10-CM

## 2020-08-19 DIAGNOSIS — R059 Cough, unspecified: Secondary | ICD-10-CM | POA: Diagnosis not present

## 2020-08-19 DIAGNOSIS — R0602 Shortness of breath: Secondary | ICD-10-CM

## 2020-08-19 DIAGNOSIS — R0989 Other specified symptoms and signs involving the circulatory and respiratory systems: Secondary | ICD-10-CM | POA: Diagnosis not present

## 2020-08-19 MED ORDER — BENZONATATE 200 MG PO CAPS: 200.0000 mg | ORAL_CAPSULE | Freq: Three times a day (TID) | ORAL | 0 refills | Status: AC | PRN

## 2020-08-19 MED ORDER — PREDNISONE 20 MG PO TABS: ORAL_TABLET | ORAL | 0 refills | Status: DC

## 2020-08-19 NOTE — Telephone Encounter (Signed)
Please schedule virtual for pt. 

## 2020-08-19 NOTE — ED Provider Notes (Signed)
[name]    CSN: [id number] Arrival date & time: 08/19/20  1300      History   Chief Complaint Chief Complaint  Patient presents with  . Shortness of Breath  . Cough    HPI Robert [name] is a 45 y.o. male.   HPI 45 year old male presents with productive cough and shortness of breath for 4 days.  Patient reports COVID 19 positive 2.5- 3 weeks ago.  Patient was evaluated by provider on video visit on 08/06/2020, patient was treated with Nirmatrelvir-Ritonavir (started course on 08/06/20) which he completed (on 08/11/20).  Past Medical History:  Diagnosis Date  . ADD (attention deficit disorder) without hyperactivity    according to his mother/ work test possible  . Anxiety   . Chronic sinusitis   . Depression   . Headache   . History of kidney stones    passed stone  . Hyperlipidemia    no meds, diet controlled  . Hypertension    recently dx 2 months ago, MD just monitoring, no meds at this time  . Hypothyroidism   . Sleep apnea    Uses CPAP nightly.    Patient Active Problem List   Diagnosis Date Noted  . Major depressive disorder with single episode, in full remission ([place]) 06/07/2019  . Low back pain 06/02/2019  . 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 Cervical radiculopathy at C6 08/01/2018  . Radial nerve entrapment, right 07/04/2018  . Hyperlipidemia, unspecified 10/22/2017  . Secondary polycythemia 09/09/2017  . Lumbar back pain with radiculopathy affecting right lower extremity 08/29/2016  . Wart 02/27/2015  . Volar plate injury of finger 01/09/2015  . Depression, major 09/27/2014  . Rash 09/27/2014  . Erectile dysfunction 09/27/2014  . Vitamin D deficiency 07/23/2014  . 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 S/P partial thyroidectomy 02/28/2014  . Anxiety state 01/11/2014  . Obesity (BMI 30-39.9) 01/11/2014  . Cyst of mandible 12/22/2013  . ADHD (attention deficit hyperactivity disorder) 06/02/2011    Past Surgical  History:  Procedure Laterality Date  . HERNIA REPAIR     pt. was an infant when this surgery occured  . NASAL SINUS SURGERY    . THYROID SURGERY Right 02/28/2014   DR TEOH    PARTIAL THYROIDECTOMY  . THYROIDECTOMY Right 02/28/2014   Procedure: RIGHT HEMI THYROIDECTOMY;  Surgeon: Ascencion Dike, MD;  Location: [place];  Service: ENT;  Laterality: Right;  . WISDOM TOOTH EXTRACTION         Home Medications    Prior to Admission medications   Medication Sig Start Date End Date Taking? Authorizing Provider  benzonatate (TESSALON) 200 MG capsule Take 1 capsule (200 mg total) by mouth 3 (three) times daily as needed for up to 7 days for cough. 08/19/20 08/26/20 Yes Eliezer Lofts, FNP  predniSONE (DELTASONE) 20 MG tablet Take 3 tabs PO daily x 3 days, then 2 tabs PO daily x 3 days, then 1 tab PO daily x 3 days 08/19/20  Yes Eliezer Lofts, FNP  Amphet-Dextroamphet 3-Bead ER (MYDAYIS) 37.5 MG CP24 Take 1 capsule by mouth daily.    [provider]  amphetamine-dextroamphetamine (ADDERALL XR) 30 MG 24 hr capsule Take 30 mg by mouth daily.  08/30/17   [provider]  ARMOUR THYROID 120 MG tablet Take 120 mg by mouth daily before breakfast.  12/26/17   [provider]  Ascorbic Acid (VITAMIN C) 1000 MG tablet Take 1,000 mg by mouth daily.    [provider]  baclofen (LIORESAL) 10 MG tablet Take 10 mg by mouth 3 (three) times daily. 06/27/19   [provider]  Cholecalciferol (VITAMIN D3 PO) Take 1 capsule by mouth daily.    [provider]  SUMAtriptan (IMITREX) 50 MG tablet Take 1 tablet (50 mg total) by mouth every 2 (two) hours as needed for migraine. May repeat in 2 hours if headache persists or recurs. 09/09/17   Marin Olp, MD  tadalafil (CIALIS) 20 MG tablet Take 0.5-1 tablets (10-20 mg total) by mouth every other day as needed for erectile dysfunction. 10/28/18   Inda Coke, PA  tamsulosin (FLOMAX) 0.4 MG CAPS capsule Take 0.4 mg by mouth  daily.    [provider]  triamcinolone cream (KENALOG) 0.1 % Apply 1 application topically 2 (two) times daily. 01/30/20   Lucretia Kern, DO    Family History Family History  Problem Relation Age of Onset  . Hyperlipidemia Mother   . Hypertension Mother   . Thyroid disease Neg Hx     Social History Social History   Tobacco Use  . Smoking status: Never Smoker  . Smokeless tobacco: Never Used  Vaping Use  . Vaping Use: Never used  Substance Use Topics  . Alcohol use: Yes    Alcohol/week: 1.0 - 5.0 standard drink    Types: 1 - 5 Cans of beer per week  . Drug use: No     Allergies   Penicillins, Topamax, Milk-related compounds, and Morphine and related   Review of Systems Review of Systems  Constitutional: Negative.   HENT: Negative.   Eyes: Negative.   Respiratory: Positive for cough and shortness of breath.   Cardiovascular: Negative.   Gastrointestinal: Negative.   Genitourinary: Negative.   Musculoskeletal: Negative.   Skin: Negative.   Neurological: Negative.      Physical Exam Triage Vital Signs ED Triage Vitals  Enc Vitals Group     BP 08/19/20 1426 (!) 152/95     Pulse Rate 08/19/20 1426 78     Resp 08/19/20 1426 20     Temp 08/19/20 1426 98.2 F (36.8 C)     Temp Source 08/19/20 1426 Oral     SpO2 08/19/20 1426 98 %     Weight 08/19/20 1428 (!) 304 lb (137.9 kg)     Height 08/19/20 1428 6\' 3"  (1.905 m)     Head Circumference --      Peak Flow --      Pain Score 08/19/20 1432 0     Pain Loc --      Pain Edu? --      Excl. in [place]? --    No data found.  Updated Vital Signs BP (!) 152/95 (BP Location: Left Arm)   Pulse 78   Temp 98.2 F (36.8 C) (Oral)   Resp 20   Ht 6\' 3"  (1.905 m)   Wt (!) 304 lb (137.9 kg)   SpO2 98%   BMI 38.00 kg/m      Physical Exam Vitals and nursing note reviewed.  Constitutional:      General: He is not in acute distress.    Appearance: He is obese. He is not ill-appearing.  HENT:     Head:  Normocephalic and atraumatic.     Mouth/Throat:     Mouth: Mucous membranes are moist.     Pharynx: Oropharynx is clear.     Comments: Moderate amount of clear drainage noted of posterior oropharynx Eyes:  Extraocular Movements: Extraocular movements intact.     Pupils: Pupils are equal, round, and reactive to light.  Neck:     Trachea: No tracheal deviation.  Cardiovascular:     Rate and Rhythm: Normal rate and regular rhythm.     Pulses: Normal pulses.     Heart sounds: Normal heart sounds.  Pulmonary:     Effort: Pulmonary effort is normal. No accessory muscle usage or respiratory distress.     Breath sounds: Normal breath sounds. No decreased breath sounds, wheezing, rhonchi or rales.     Comments: No adventitious breath sounds noted, infrequent nonproductive cough noted Musculoskeletal:     Cervical back: Normal range of motion and neck supple.  Lymphadenopathy:     Cervical: No cervical adenopathy.  Skin:    General: Skin is warm and dry.  Neurological:     General: No focal deficit present.     Mental Status: He is alert and oriented to person, place, and time.  Psychiatric:        Mood and Affect: Mood normal.        Behavior: Behavior normal.      UC Treatments / Results  Labs (all labs ordered are listed, but only abnormal results are displayed) Labs Reviewed - No data to display  EKG   Radiology DG Chest 2 View  Result Date: 08/19/2020 CLINICAL DATA:  Productive cough and intermittent shortness of breath for 3-4 days. EXAM: CHEST - 2 VIEW COMPARISON:  None. FINDINGS: The lungs are clear. Heart size is normal. No pneumothorax or pleural fluid. No acute or focal bony abnormality. IMPRESSION: Negative chest. Electronically Signed   By: Inge Rise M.D.   On: 08/19/2020 15:07    Procedures Procedures (including critical care time)  Medications Ordered in UC Medications - No data to display  Initial Impression / Assessment and Plan / UC Course  I  have reviewed the triage vital signs and the nursing notes.  Pertinent labs & imaging results that were available during my care of the patient were reviewed by me and considered in my medical decision making (see chart for details).     MDM: 1. Cough, 2.  Chest congestion, 3.  Allergic rhinitis.,  Hemodynamically stable. Final Clinical Impressions(s) / UC Diagnoses   Final diagnoses:  Cough  Chest congestion  Allergic rhinitis, unspecified seasonality, unspecified trigger     Discharge Instructions     Advised to take medication as prescribed with food to completion.  Encouraged patient increase daily water intake while taking medications.  Advised/encouraged patient to take Allegra 180 mg daily for the next 5 to 7 days, then as needed.    ED Prescriptions    Medication Sig Dispense Auth. Provider   predniSONE (DELTASONE) 20 MG tablet Take 3 tabs PO daily x 3 days, then 2 tabs PO daily x 3 days, then 1 tab PO daily x 3 days 18 tablet Eliezer Lofts, FNP   benzonatate (TESSALON) 200 MG capsule Take 1 capsule (200 mg total) by mouth 3 (three) times daily as needed for up to 7 days for cough. 30 capsule Eliezer Lofts, FNP     PDMP not reviewed this encounter.   Eliezer Lofts, [place] 08/19/20 [id number]

## 2020-08-19 NOTE — ED Triage Notes (Signed)
Patient reports 3-4 days of cough productive at times and SOB. He is hoarse and waking up at night coughing with is CPAP. Afebrile. Covid infection 2 1/2-3 weeks ago.

## 2020-08-19 NOTE — Discharge Instructions (Signed)
Advised to take medication as prescribed with food to completion.  Encouraged patient increase daily water intake while taking medications.  Advised/encouraged patient to take Allegra 180 mg daily for the next 5 to 7 days, then as needed.

## 2020-08-19 NOTE — Telephone Encounter (Signed)
Patient is currently in ED, wanted appointment in office today but had no availability anywhere.

## 2020-08-19 NOTE — Telephone Encounter (Signed)
Nurse Assessment Nurse: Sumner Boast, RN, [place] Date/Time Eilene Ghazi Time): 08/19/2020 9:47:50 AM Confirm and document reason for call. If symptomatic, describe symptoms. ---Caller states he is having coughing fits and moderate trouble breathing. Symptoms started 3 days ago. Cough is productive with brownish yellow mucous and very thick. Taking Albuterol Inhaler. No chest pain. No fever. Had COVID 2 weeks ago and took Paxlovid. Does the patient have any new or worsening symptoms? ---Yes Will a triage be completed? ---Yes Related visit to physician within the last 2 weeks? ---Yes Does the PT have any chronic conditions? (i.e. diabetes, asthma, this includes High risk factors for pregnancy, etc.) ---Yes List chronic conditions. ---Obstructive Sleep Apnea ADHD Hypothyroidism Is this a behavioral health or substance abuse call? ---No Guidelines Guideline Title Affirmed Question Affirmed Notes Nurse Date/Time (Eastern Time) Breathing Difficulty [1] MODERATE difficulty breathing (e.g., speaks in phrases, SOB even at rest, pulse 100-120) AND [2] NEW-onset Janace Litten 08/19/2020 9:52:42 AM PLEASE NOTE: All timestamps contained within this report are represented as [place] Standard Time. CONFIDENTIALTY NOTICE: This fax transmission is intended only for the addressee. It contains information that is legally privileged, confidential or otherwise protected from use or disclosure. If you are not the intended recipient, you are strictly prohibited from reviewing, disclosing, copying using or disseminating any of this information or taking any action in reliance on or regarding this information. If you have received this fax in error, please notify us immediately by telephone so that we can arrange for its return to [place]. Phone: [phone], Toll-Free: [phone], Fax: [phone] Page: 2 of 2 Call Id: [id number] Guidelines Guideline Title Affirmed Question Affirmed Notes Nurse Date/Time  Eilene Ghazi Time) or WORSE than normal Disp. Time Eilene Ghazi Time) Disposition Final User 08/19/2020 9:44:13 AM Send to Urgent Queue Peggye Fothergill 08/19/2020 10:00:50 AM Go to ED Now Yes Sumner Boast, RN, Teola Bradley Disagree/Comply Comply Caller Understands Yes PreDisposition Call Doctor Care Advice Given Per Guideline GO TO ED NOW: CARE ADVICE given per Breathing Difficulty (Adult) guideline. Comments User: Lorra Hals, RN Date/Time Eilene Ghazi Time): 08/19/2020 10:01:49 AM Caller refuses to go to the ED. Tried to get an office appt today but they are full. States he will go to [facility] today. Referrals GO TO FACILITY REFUSE

## 2020-09-16 ENCOUNTER — Ambulatory Visit (INDEPENDENT_AMBULATORY_CARE_PROVIDER_SITE_OTHER): Payer: Commercial Managed Care - PPO | Admitting: Registered Nurse

## 2020-09-16 ENCOUNTER — Encounter: Payer: Self-pay | Admitting: Registered Nurse

## 2020-09-16 ENCOUNTER — Other Ambulatory Visit: Payer: Self-pay

## 2020-09-16 VITALS — BP 155/99 | HR 79 | Temp 98.3°F | Resp 18 | Ht 75.0 in | Wt 307.6 lb

## 2020-09-16 DIAGNOSIS — B353 Tinea pedis: Secondary | ICD-10-CM | POA: Diagnosis not present

## 2020-09-16 MED ORDER — CLOTRIMAZOLE-BETAMETHASONE 1-0.05 % EX CREA
1.0000 | TOPICAL_CREAM | Freq: Two times a day (BID) | CUTANEOUS | 1 refills | Status: DC
Start: 2020-09-16 — End: 2021-06-16

## 2020-09-16 NOTE — Progress Notes (Signed)
Acute Office Visit  Subjective:    Patient ID: Robert [name], male    DOB: 1975/08/14, 45 y.o.   MRN: [id number]  Chief Complaint  Patient presents with   Blister    Patient states he has noticed a blister on the bottom of his middle toe on right foot for about 2-3 days. Patient has been using numbing spray to numb pain.    HPI Patient is in today for blister Middle toe R foot Onset 2-3 days Superficial sharp pain Popped blister, has since filled back with blood Some dry skin under foot, mildly itching Small toe has some thickening and yellowing of nail  Topical lidocaine for relief with good effect Has not tried other treatments Has not happened before.  Past Medical History:  Diagnosis Date   ADD (attention deficit disorder) without hyperactivity    according to his mother/ work test possible   Anxiety    Chronic sinusitis    Depression    Headache    History of kidney stones    passed stone   Hyperlipidemia    no meds, diet controlled   Hypertension    recently dx 2 months ago, MD just monitoring, no meds at this time   Hypothyroidism    Sleep apnea    Uses CPAP nightly.    Past Surgical History:  Procedure Laterality Date   HERNIA REPAIR     pt. was an infant when this surgery occured   NASAL SINUS SURGERY     THYROID SURGERY Right 02/28/2014   DR Benjamine Mola    PARTIAL THYROIDECTOMY   THYROIDECTOMY Right 02/28/2014   Procedure: RIGHT HEMI THYROIDECTOMY;  Surgeon: Ascencion Dike, MD;  Location: [facility] OR;  Service: ENT;  Laterality: Right;   WISDOM TOOTH EXTRACTION      Family History  Problem Relation Age of Onset   Hyperlipidemia Mother    Hypertension Mother    Thyroid disease Neg Hx     Social History   Socioeconomic History   Marital status: Married    Spouse name: Not on file   Number of children: Not on file   Years of education: Not on file   Highest education level: Not on file  Occupational History   Not on file  Tobacco Use   Smoking  status: Never   Smokeless tobacco: Never  Vaping Use   Vaping Use: Never used  Substance and Sexual Activity   Alcohol use: Yes    Alcohol/week: 1.0 - 5.0 standard drink    Types: 1 - 5 Cans of beer per week   Drug use: No   Sexual activity: Yes  Other Topics Concern   Not on file  Social History Narrative   Married 2016. 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      HH of 1 pets dog and cats.   Social Determinants of Health   Financial Resource Strain: Not on file  Food Insecurity: Not on file  Transportation Needs: Not on file  Physical Activity: Not on file  Stress: Not on file  Social Connections: Not on file  Intimate Partner Violence: Not on file    Outpatient Medications Prior to Visit  Medication Sig Dispense Refill   Amphet-Dextroamphet 3-Bead ER (MYDAYIS) 37.5 MG CP24 Take 1 capsule by mouth daily.     amphetamine-dextroamphetamine (ADDERALL XR) 30 MG 24 hr capsule  Take 30 mg by mouth daily.      ARMOUR THYROID 120 MG tablet Take 120 mg by mouth daily before breakfast.      Ascorbic Acid (VITAMIN C) 1000 MG tablet Take 1,000 mg by mouth daily.     baclofen (LIORESAL) 10 MG tablet Take 10 mg by mouth 3 (three) times daily.     Cholecalciferol (VITAMIN D3 PO) Take 1 capsule by mouth daily.     predniSONE (DELTASONE) 20 MG tablet Take 3 tabs PO daily x 3 days, then 2 tabs PO daily x 3 days, then 1 tab PO daily x 3 days 18 tablet 0   SUMAtriptan (IMITREX) 50 MG tablet Take 1 tablet (50 mg total) by mouth every 2 (two) hours as needed for migraine. May repeat in 2 hours if headache persists or recurs. 90 tablet 3   tadalafil (CIALIS) 20 MG tablet Take 0.5-1 tablets (10-20 mg total) by mouth every other day as needed for erectile dysfunction. 5 tablet 2   tamsulosin (FLOMAX) 0.4 MG CAPS capsule Take 0.4 mg by mouth daily.     triamcinolone cream (KENALOG) 0.1 %  Apply 1 application topically 2 (two) times daily. 30 g 0   No facility-administered medications prior to visit.    Allergies  Allergen Reactions   Penicillins Anaphylaxis    Did it involve swelling of the face/tongue/throat, SOB, or low BP? Yes Did it involve sudden or severe rash/hives, skin peeling, or any reaction on the inside of your mouth or nose? Yes Did you need to seek medical attention at a hospital or doctor's office? Yes When did it last happen?      64-83 years old If all above answers are "NO", may proceed with cephalosporin use.    Topamax Other (See Comments)    FACE BILATERAL NUMBNESS   Milk-Related Compounds Other (See Comments)    GI issues Pt reports that he cannot drink milk but can tolerate other dairy products   Morphine And Related Itching    Full body itching    Review of Systems Per hpi, otherwise negative    Objective:    Physical Exam Vitals and nursing note reviewed.  Constitutional:      Appearance: Normal appearance.  Cardiovascular:     Rate and Rhythm: Normal rate and regular rhythm.     Pulses: Normal pulses.     Heart sounds: Normal heart sounds. No murmur heard.   No friction rub. No gallop.  Pulmonary:     Effort: Pulmonary effort is normal. No respiratory distress.     Breath sounds: Normal breath sounds. No stridor. No wheezing, rhonchi or rales.  Skin:    Findings: Lesion (blister under middle digit R foot) present.  Neurological:     General: No focal deficit present.     Mental Status: He is alert. Mental status is at baseline.  Psychiatric:        Mood and Affect: Mood normal.        Behavior: Behavior normal.        Thought Content: Thought content normal.        Judgment: Judgment normal.    BP (!) 155/99   Pulse 79   Temp 98.3 F (36.8 C) (Temporal)   Resp 18   Ht 6\' 3"  (1.905 m)   Wt (!) 307 lb 9.6 oz (139.5 kg)   SpO2 97%   BMI 38.45 kg/m  Wt Readings from Last 3 Encounters:  09/16/20 (!) 307 lb 9.6  oz  (139.5 kg)  08/19/20 (!) 304 lb (137.9 kg)  06/11/20 (!) 302 lb 12.8 oz (137.3 kg)    There are no preventive care reminders to display for this patient.  There are no preventive care reminders to display for this patient.   Lab Results  Component Value Date   TSH 0.83 06/11/2020   Lab Results  Component Value Date   WBC 8.0 06/11/2020   HGB 16.7 06/11/2020   HCT 49.0 06/11/2020   MCV 86.8 06/11/2020   PLT 317.0 06/11/2020   Lab Results  Component Value Date   NA 140 06/11/2020   K 4.2 06/11/2020   CO2 28 06/11/2020   GLUCOSE 85 06/11/2020   BUN 15 06/11/2020   CREATININE 1.05 06/11/2020   BILITOT 0.9 06/11/2020   ALKPHOS 74 06/11/2020   AST 17 06/11/2020   ALT 25 06/11/2020   PROT 6.8 06/11/2020   ALBUMIN 4.5 06/11/2020   CALCIUM 9.6 06/11/2020   ANIONGAP 10 06/02/2019   GFR 87.64 06/11/2020   Lab Results  Component Value Date   CHOL 232 (H) 06/11/2020   Lab Results  Component Value Date   HDL 42.30 06/11/2020   Lab Results  Component Value Date   LDLCALC 165 (H) 06/11/2020   Lab Results  Component Value Date   TRIG 126.0 06/11/2020   Lab Results  Component Value Date   CHOLHDL 5 06/11/2020   Lab Results  Component Value Date   HGBA1C 5.4 10/13/2016       Assessment & Plan:   Problem List Items Addressed This Visit   None Visit Diagnoses     Tinea pedis of right foot    -  Primary   Relevant Medications   clotrimazole-betamethasone (LOTRISONE) cream        Meds ordered this encounter  Medications   clotrimazole-betamethasone (LOTRISONE) cream    Sig: Apply 1 application topically 2 (two) times daily.    Dispense:  30 g    Refill:  1    Order Specific Question:   Supervising Provider    Answer:   Carlota Raspberry, JEFFREY R [[id number]]   PLAN Lotrisone twice daily. Suspect tinea infection. Can use on toe nail too Return if worsening or failing to improve Patient encouraged to call clinic with any questions, comments, or concerns.  Maximiano Coss, NP

## 2020-09-16 NOTE — Patient Instructions (Addendum)
Mr. Robert [name] to meet you  Apply clotrimazole-betamethasone topically twice daily Can continue topical lidocaine If worsening, let me know  Otherwise should resolve in around a week  Thank you  Rich    If you have lab work done today you will be contacted with your lab results within the next 2 weeks.  If you have not heard from [place] then please contact us. The fastest way to get your results is to register for My Chart.   IF you received an x-ray today, you will receive an invoice from [facility] Radiology. Please contact [facility] at [phone] with questions or concerns regarding your invoice.   IF you received labwork today, you will receive an invoice from [facility]. Please contact [facility] at [phone] with questions or concerns regarding your invoice.   Our billing staff will not be able to assist you with questions regarding bills from these companies.  You will be contacted with the lab results as soon as they are available. The fastest way to get your results is to activate your My Chart account. Instructions are located on the last page of this paperwork. If you have not heard from [place] regarding the results in 2 weeks, please contact this office.

## 2020-10-05 ENCOUNTER — Telehealth: Payer: Commercial Managed Care - PPO | Admitting: Nurse Practitioner

## 2020-10-05 DIAGNOSIS — R197 Diarrhea, unspecified: Secondary | ICD-10-CM | POA: Diagnosis not present

## 2020-10-05 MED ORDER — CIPROFLOXACIN HCL 500 MG PO TABS: 500.0000 mg | ORAL_TABLET | Freq: Two times a day (BID) | ORAL | 0 refills | Status: AC

## 2020-10-05 NOTE — Patient Instructions (Signed)
Food Poisoning and Traveling Food poisoning is an illness that is caused by eating or drinking something that has been contaminated with toxins. Some types of food poisoning trigger symptoms within a few hours. Others may take 1-2 weeks for symptoms to appear. Symptoms of food poisoning may include: Diarrhea. Cramping or abdominal pain. Nausea and vomiting. Fever. Dizziness. Aches and pains. Before you travel, learn about the foodborne illnesses that are common in the areas you will be visiting. The risk for food poisoning varies from country tocountry and from one region of the world to another. What types of illness can be passed through food and drinks?  Most cases of food poisoning are caused by bacteria or viruses. Untreated,these cases usually last 2-7 days. Food poisoning can also be caused by some microscopic parasites. Parasites are organisms that live off another larger organism. Illness caused by parasitescan take 1-2 weeks to appear and may last several months. What actions can I take to lower my risk of food poisoning while traveling?  Wash your hands with soap and water often, especially before eating food. Carry travel-size bottles of alcohol-based hand sanitizer. Use it when you do not have access to soap and water. Understand what foods and drinks are generally safe, and what foods and drinks to avoid. Foods that are generally safe Food that is thoroughly cooked. Food that is served hot. Hard-boiled eggs. Fruits and vegetables that you wash and peel yourself. Cheese that has been treated with high heat (pasteurized). Drinks that are generally safe Pasteurized milk. Bottled waters, soda, or sports drinks. Drinks that you know were sealed until you opened them. Water that you know has been treated, boiled, or filtered to remove microorganisms. Ice from treated or bottled water. Drinks made with boiling water, such as tea or coffee. Foods to avoid Raw or undercooked  foods. Raw or runny eggs. Food that is not hot, such as food that has been on a buffet or picnic table for a while. Raw fruits or vegetables that have not been washed and peeled. Other items made with fresh vegetables or fruits, such as salad and salsa. Cheese that has not been pasteurized. Meat from local animals, such as monkeys and bats. Drinks to avoid Water from the tap or a well. Water from a fresh-water source, such as a stream. Ice from a tap, well, or fresh-water source. Beverages that include water from a well, tap, or fresh-water source. Milk that is not pasteurized. Beverages from soda fountains. What countries have a higher risk for food poisoning? Countries with a high risk Countries in [place]. Countries in the [place]land in [place]. [place]. Countries in [place] and [place]. Countries with a mid-range risk Countries in [place]. [place]. Some [place]. Countries with a low risk The [place]. [place]. [place]. [place]. [place]. Some countries in [place] or [place]. Questions to ask your health care provider What is my personal risk for getting food poisoning while traveling? Can you prescribe medicines to prevent food poisoning or to treat symptoms like diarrhea? How do I take the medicines you prescribed? What over-the-counter medicines can I buy to help with food poisoning? How do I take care of myself if I think I have food poisoning while traveling? Does the area I am traveling to have a low, medium, or high risk for foodborne illness? Where to find more information Visit a travel medicine clinic or speak with a health care provider who specializes in travel medicine as soon  as you know your travel plans. Check the Travelers' Health section on the website of the Centers for Disease Control and Prevention (CDC): WaveHands.com.ee Contact a health care provider if: You develop symptoms of food  poisoning during or after travel. Summary Food poisoning is an illness that is caused by eating or drinking something contaminated with toxins. Symptoms can occur within hours or may take 1-2 weeks to appear. Symptoms include diarrhea, cramping, fever, nausea and vomiting, dizziness, aches, and pains. Before you travel, learn about the foodborne illnesses that are common in the areas you intend to visit. Consider visiting a travel medicine clinic before your trip. While traveling, be aware of which foods and drinks are generally safe or should be avoided. Wash your hands with soap and water often. Use alcohol-based hand sanitizer if you do not have access to soap and water. This information is not intended to replace advice given to you by your health care provider. Make sure you discuss any questions you have with your healthcare provider. Document Revised: 12/28/2019 Document Reviewed: 12/28/2019 Elsevier Patient Education  [place].

## 2020-10-05 NOTE — Progress Notes (Signed)
Virtual Visit Consent   Robert [name], you are scheduled for a virtual visit with [name], FNP, a [facility] provider, today.     Just as with appointments in the office, your consent must be obtained to participate.  Your consent will be active for this visit and any virtual visit you may have with one of our providers in the next 365 days.     If you have a MyChart account, a copy of this consent can be sent to you electronically.  All virtual visits are billed to your insurance company just like a traditional visit in the office.    As this is a virtual visit, video technology does not allow for your provider to perform a traditional examination.  This may limit your provider's ability to fully assess your condition.  If your provider identifies any concerns that need to be evaluated in person or the need to arrange testing (such as labs, EKG, etc.), we will make arrangements to do so.     Although advances in technology are sophisticated, we cannot ensure that it will always work on either your end or our end.  If the connection with a video visit is poor, the visit may have to be switched to a telephone visit.  With either a video or telephone visit, we are not always able to ensure that we have a secure connection.     I need to obtain your verbal consent now.   Are you willing to proceed with your visit today? YES   Robert [name] has provided verbal consent on 10/05/2020 for a virtual visit (video or telephone).   [name], FNP   Date: 10/05/2020 8:30 AM   Virtual Visit via Video Note   I, [name], connected with Robert [name] ([id number], 28-Apr-1975) on 10/05/20 at  8:30 AM EDT by a video-enabled telemedicine application and verified that I am speaking with the correct person using two identifiers.  Location: Patient: Virtual Visit Location Patient: Home Provider: Virtual Visit Location Provider: Mobile   I discussed the limitations  of evaluation and management by telemedicine and the availability of in person appointments. The patient expressed understanding and agreed to proceed.    History of Present Illness: Robert [name] is a 45 y.o. who identifies as a male who was assigned male at birth, and is being seen today for diarrhea.  HPI: HPI   Patient just returned from [place] on Thursday. He said hat he developed diarrhea and Wednesday prior to coming home. Has gotten worse since he has been home. He has tired imodium and pepto bismol OTC with no relief. He denies abdominal pain. Review of Systems  Constitutional:  Negative for chills and fever.  Gastrointestinal:  Positive for diarrhea. Negative for abdominal pain, blood in stool, nausea and vomiting.   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   Lumbar back pain with radiculopathy affecting right lower extremity 08/29/2016   Wart 02/27/2015   Volar plate injury of finger 01/09/2015   Depression, major 09/27/2014   Rash 09/27/2014   Erectile dysfunction 09/27/2014   Vitamin D deficiency 07/23/2014   Post-surgical hypothyroidism 06/20/2014   S/P  partial thyroidectomy 02/28/2014   Anxiety state 01/11/2014   Obesity (BMI 30-39.9) 01/11/2014   Cyst of mandible 12/22/2013   ADHD (attention deficit hyperactivity disorder) 06/02/2011    Allergies:  Allergies  Allergen Reactions   Penicillins Anaphylaxis    Did it involve swelling of the face/tongue/throat, SOB, or low BP? Yes Did it involve sudden or severe rash/hives, skin peeling, or any reaction on the inside of your mouth or nose? Yes Did you need to seek medical  attention at a hospital or doctor's office? Yes When did it last happen?      71-51 years old If all above answers are "NO", may proceed with cephalosporin use.    Topamax Other (See Comments)    FACE BILATERAL NUMBNESS   Milk-Related Compounds Other (See Comments)    GI issues Pt reports that he cannot drink milk but can tolerate other dairy products   Morphine And Related Itching    Full body itching   Medications:  Current Outpatient Medications:    Amphet-Dextroamphet 3-Bead ER (MYDAYIS) 37.5 MG CP24, Take 1 capsule by mouth daily., Disp: , Rfl:    amphetamine-dextroamphetamine (ADDERALL XR) 30 MG 24 hr capsule, Take 30 mg by mouth daily. , Disp: , Rfl:    ARMOUR THYROID 120 MG tablet, Take 120 mg by mouth daily before breakfast. , Disp: , Rfl:    Ascorbic Acid (VITAMIN C) 1000 MG tablet, Take 1,000 mg by mouth daily., Disp: , Rfl:    baclofen (LIORESAL) 10 MG tablet, Take 10 mg by mouth 3 (three) times daily., Disp: , Rfl:    Cholecalciferol (VITAMIN D3 PO), Take 1 capsule by mouth daily., Disp: , Rfl:    clotrimazole-betamethasone (LOTRISONE) cream, Apply 1 application topically 2 (two) times daily., Disp: 30 g, Rfl: 1   predniSONE (DELTASONE) 20 MG tablet, Take 3 tabs PO daily x 3 days, then 2 tabs PO daily x 3 days, then 1 tab PO daily x 3 days, Disp: 18 tablet, Rfl: 0   SUMAtriptan (IMITREX) 50 MG tablet, Take 1 tablet (50 mg total) by mouth every 2 (two) hours as needed for migraine. May repeat in 2 hours if headache persists or recurs., Disp: 90 tablet, Rfl: 3   tadalafil (CIALIS) 20 MG tablet, Take 0.5-1 tablets (10-20 mg total) by mouth every other day as needed for erectile dysfunction., Disp: 5 tablet, Rfl: 2   tamsulosin (FLOMAX) 0.4 MG CAPS capsule, Take 0.4 mg by mouth daily., Disp: , Rfl:    triamcinolone cream (KENALOG) 0.1 %, Apply 1 application topically 2 (two) times daily., Disp: 30 g, Rfl: 0  Observations/Objective: Patient is well-developed, well-nourished in no  acute distress.  Resting comfortably  at home.  Head is normocephalic, atraumatic.  No labored breathing.  Speech is clear and coherent with logical content.  Patient is alert and oriented at baseline.    Assessment and Plan:  Robert [name] in today with chief complaint of No chief complaint on file.   1. Diarrhea of presumed infectious origin Fprce fluids Eat bananna daily Bland diet Continue imodium AD OTC Meds ordered this encounter  Medications   ciprofloxacin (CIPRO) 500 MG tablet    Sig: Take 1 tablet (500 mg total) by mouth 2 (two) times daily for 5 days.    Dispense:  10 tablet    Refill:  0    Order Specific Question:   Supervising Provider    Answer:   Noemi Chapel [[id number]]  Follow Up Instructions: I discussed the assessment and treatment plan with the patient. The patient was provided an opportunity to ask questions and all were answered. The patient agreed with the plan and demonstrated an understanding of the instructions.  A copy of instructions were sent to the patient via MyChart.  The patient was advised to call back or seek an in-person evaluation if the symptoms worsen or if the condition fails to improve as anticipated.  Time:  I spent 10 minutes with the patient via telehealth technology discussing the above problems/concerns.    [name], FNP

## 2020-11-19 ENCOUNTER — Other Ambulatory Visit: Payer: Self-pay

## 2020-11-19 ENCOUNTER — Encounter: Payer: Self-pay | Admitting: Family Medicine

## 2020-11-19 ENCOUNTER — Ambulatory Visit (INDEPENDENT_AMBULATORY_CARE_PROVIDER_SITE_OTHER): Payer: Commercial Managed Care - PPO | Admitting: Family Medicine

## 2020-11-19 DIAGNOSIS — Z131 Encounter for screening for diabetes mellitus: Secondary | ICD-10-CM | POA: Diagnosis not present

## 2020-11-19 LAB — HEMOGLOBIN A1C: Hgb A1c MFr Bld: 5.5 % (ref 4.6–6.5)

## 2020-11-19 NOTE — Progress Notes (Signed)
Phone [phone] In person visit   Subjective:   Robert [name] is a 45 y.o. year old very pleasant male patient who presents for/with See problem oriented charting Chief Complaint  Patient presents with   Obesity    Pt would like to discuss weight loss options.    This visit occurred during the SARS-CoV-2 public health emergency.  Safety protocols were in place, including screening questions prior to the visit, additional usage of staff PPE, and extensive cleaning of exam room while observing appropriate contact time as indicated for disinfecting solutions.   Past Medical History-  Patient Active Problem List   Diagnosis Date Noted   Low back pain 06/02/2019    Priority: High   Spondylolysis, lumbar region 04/21/2019    Priority: High   Secondary polycythemia 09/09/2017    Priority: High   Lumbar back pain with radiculopathy affecting right lower extremity 08/29/2016    Priority: High   Hyperlipidemia, unspecified 10/22/2017    Priority: Medium   Depression, major 09/27/2014    Priority: Medium   Post-surgical hypothyroidism 06/20/2014    Priority: Medium   Anxiety state 01/11/2014    Priority: Medium   Obesity (BMI 30-39.9) 01/11/2014    Priority: Medium   ADHD (attention deficit hyperactivity disorder) 06/02/2011    Priority: Medium   Major depressive disorder with single episode, in full remission ([place]) 06/07/2019    Priority: Low   Cervical radiculopathy at C6 08/01/2018    Priority: Low   Radial nerve entrapment, right 07/04/2018    Priority: Low   Wart 02/27/2015    Priority: Low   Volar plate injury of finger 01/09/2015    Priority: Low   Rash 09/27/2014    Priority: Low   Erectile dysfunction 09/27/2014    Priority: Low   Vitamin D deficiency 07/23/2014    Priority: Low   S/P partial thyroidectomy 02/28/2014    Priority: Low   Cyst of mandible 12/22/2013    Priority: Low    Medications- reviewed and updated Current Outpatient Medications   Medication Sig Dispense Refill   Amphet-Dextroamphet 3-Bead ER (MYDAYIS) 37.5 MG CP24 Take 1 capsule by mouth daily.     amphetamine-dextroamphetamine (ADDERALL XR) 30 MG 24 hr capsule Take 30 mg by mouth daily.      ARMOUR THYROID 120 MG tablet Take 120 mg by mouth daily before breakfast.      Ascorbic Acid (VITAMIN C) 1000 MG tablet Take 1,000 mg by mouth daily.     baclofen (LIORESAL) 10 MG tablet Take 10 mg by mouth 3 (three) times daily.     Cholecalciferol (VITAMIN D3 PO) Take 1 capsule by mouth daily.     clotrimazole-betamethasone (LOTRISONE) cream Apply 1 application topically 2 (two) times daily. 30 g 1   Cyanocobalamin (VITAMIN B-12 PO)      Magnesium Citrate 100 MG CAPS take 1 capsule by oral route every day     oxybutynin (DITROPAN) 5 MG tablet Take 5 mg by mouth at bedtime.     SUMAtriptan (IMITREX) 50 MG tablet Take 1 tablet (50 mg total) by mouth every 2 (two) hours as needed for migraine. May repeat in 2 hours if headache persists or recurs. 90 tablet 3   tadalafil (CIALIS) 20 MG tablet Take 0.5-1 tablets (10-20 mg total) by mouth every other day as needed for erectile dysfunction. 5 tablet 2   tamsulosin (FLOMAX) 0.4 MG CAPS capsule Take 0.4 mg by mouth daily.     tiZANidine (ZANAFLEX) 4  MG tablet take 1 tablet by oral route up to qid prn spasm     triamcinolone cream (KENALOG) 0.1 % Apply 1 application topically 2 (two) times daily. 30 g 0   Cholecalciferol (VITAMIN D3) 100000 UNIT/GM POWD Take 1 capsule by mouth daily.     predniSONE (DELTASONE) 20 MG tablet Take 3 tabs PO daily x 3 days, then 2 tabs PO daily x 3 days, then 1 tab PO daily x 3 days (Patient not taking: Reported on 11/19/2020) 18 tablet 0   No current facility-administered medications for this visit.     Objective:  BP (!) 152/100   Pulse 79   Temp 97.8 F (36.6 C) (Temporal)   Ht '6\' 3"'$  (1.905 m)   Wt (!) 309 lb 6.4 oz (140.3 kg)   SpO2 99%   BMI 38.67 kg/m  Gen: NAD, resting comfortably      Assessment and Plan   #Cervical disc surgery- did well with surgery. Still having low back issues  # Obesity  S:has gained some weight recently and feels like he is carrying more- from last year up 8 lbs  approximately. Typically from 280s higher side to low 300s- this is on the higher end in general for him.  - he has tried to improve diet some. Hard with daughters food pickiness- prepares foods for her and then easier to just eat those. He has reduced fried foods- such as fries/hasbrowns. Switched soda to diet and caffeine free. Drinks 1-3 cups of water a day. These changes for 3-4 months.  -walking some perhaps once a week.  -also travels fair amount and so eating when outside the home is challenging.  -gets angry/mad with 2000 calorie diet in the past- feels hungry and does not work well for him.   -Wants to discuss Mounjaro injections  Wt Readings from Last 3 Encounters:  11/19/20 (!) 309 lb 6.4 oz (140.3 kg)  09/16/20 (!) 307 lb 9.6 oz (139.5 kg)  08/19/20 (!) 304 lb (137.9 kg)   A/P: Patient is interested in weight loss medication to help him with his weight loss journey.  We discussed really needs to enact lifestyle changes first and then we can use weight loss medicines if needed if he reaches a plateau.  Does have morbid obesity with BMI over 35 and hypertension and hyperlipidemia-discussed healthy weight to wellness referral or gastric bypass referral as options as well but he would like to hold off on those  With morbid obesity we agreed to screen for prediabetes.  We discussed possibly using Wegovy or Ozempic to help with weight loss if reaches plateau -In regards other weight loss options had allergy to Topamax and phentermine could certainly cause blood pressure elevation  We did not get a chance to discuss today but I would like to get some home readings on blood pressure-he may need to come off of Adderall.  Hopefully weight trends down and blood pressure does to  From  AVS "  I do recommend you trying to cook healthy food options at home - this will help you on your weight loss journey and potentially you daughter as well in the long run  Please try get at least 60-80 ounces of water daily and to cut out diet sodas, juices, and any other beverages.  I am glad to hear you are exercising weekly but lets bump this to 150 minutes per week.   Lets start calorie counting through the "MyFitnessPal" app or the "half-plate" portioning method  to help with tracking your caloric intake. If you use the "MyFitnessPal" app, I advise setting yourself a goal to lose 1 lb a week -if this is not possible for you then try to lose half of a pound per week. Trial this for at least 2 months and update me with results through mychart or you are welcome to do a visit at that time.   Dr. Francine Graven for "[facility] Diet" video on YouTube.  Recommended follow up: keep march visit "  #Health maintenance-recommended fall flu shot-patient declines COVID-19 vaccination  Future Appointments  Date Time Provider [place]  06/16/2021  8:40 AM Marin Olp, MD [facility]    Lab/Order associations:   ICD-10-CM   1. Morbid obesity ([place])  E66.01 Hemoglobin A1c    2. Screening for diabetes mellitus  Z13.1 Hemoglobin A1c      I,Harris Phan,acting as a scribe for Garret Reddish, MD.,have documented all relevant documentation on the behalf of Garret Reddish, MD,as directed by  Garret Reddish, MD while in the presence of Garret Reddish, MD.   I, Garret Reddish, MD, have reviewed all documentation for this visit. The documentation on 11/19/20 for the exam, diagnosis, procedures, and orders are all accurate and complete.    Time Spent: 42 minutes of total time (1:05 PM- 1:47 PM) was spent on the date of the encounter performing the following actions: chart review prior to seeing the patient, obtaining history, , counseling on the treatment plan and about weight loss medications as well  as ways to lose weight, placing orders, and documenting in our EHR.    Return precautions advised.  Garret Reddish, MD

## 2020-11-19 NOTE — Patient Instructions (Addendum)
Health Maintenance Due  Topic Date Due   INFLUENZA VACCINE - Please consider getting your flu shot in the Fall. If you get this outside of our office, please let us know.   Wants to hold off on covid vaccine 10/28/2020   Please stop by lab before you go If you have mychart- we will send your results within 3 business days of [place] receiving them.  If you do not have mychart- we will call you about results within 5 business days of [place] receiving them.  *please also note that you will see labs on mychart as soon as they post. I will later go in and write notes on them- will say "notes from Dr. Yong Channel"  I do recommend you trying to cook healthy food options at home - this will help you on your weight loss journey and potentially you daughter as well in the long run  Please try get at least 60-80 ounces of water daily and to cut out diet sodas, juices, and any other beverages.  I am glad to hear you are exercising weekly but lets bump this to 150 minutes per week.   Lets start calorie counting through the "MyFitnessPal" app or the "half-plate" portioning method to help with tracking your caloric intake. If you use the "MyFitnessPal" app, I advise setting yourself a goal to lose 1 lb a week -if this is not possible for you then try to lose half of a pound per week. Trial this for at least 2 months and update me with results through mychart or you are welcome to do a visit at that time.   Dr. Francine Graven for "The [facility] Diet" video on YouTube.  Recommended follow up: keep march visit

## 2021-06-02 NOTE — Progress Notes (Incomplete)
Phone: [phone]    Subjective:  Patient presents today for their annual physical. Chief complaint-noted.   See problem oriented charting- ROS- full  review of systems was completed and negative  except for: ***  The following were reviewed and entered/updated in epic: Past Medical History:  Diagnosis Date   ADD (attention deficit disorder) without hyperactivity    according to his mother/ work test possible   Anxiety    Chronic sinusitis    Depression    Headache    History of kidney stones    passed stone   Hyperlipidemia    no meds, diet controlled   Hypertension    recently dx 2 months ago, MD just monitoring, no meds at this time   Hypothyroidism    Sleep apnea    Uses CPAP nightly.   Patient Active Problem List   Diagnosis Date Noted   Major depressive disorder with single episode, in full remission ([place]) 06/07/2019   Low back pain 06/02/2019   Spondylolysis, lumbar region 04/21/2019   Cervical radiculopathy at C6 08/01/2018   Radial nerve entrapment, right 07/04/2018   Hyperlipidemia, unspecified 10/22/2017   Secondary polycythemia 09/09/2017   Lumbar back pain with radiculopathy affecting right lower extremity 08/29/2016   Wart 02/27/2015   Volar plate injury of finger 01/09/2015   Depression, major 09/27/2014   Rash 09/27/2014   Erectile dysfunction 09/27/2014   Vitamin D deficiency 07/23/2014   Post-surgical hypothyroidism 06/20/2014   S/P partial thyroidectomy 02/28/2014   Anxiety state 01/11/2014   Obesity (BMI 30-39.9) 01/11/2014   Cyst of mandible 12/22/2013   ADHD (attention deficit hyperactivity disorder) 06/02/2011   Past Surgical History:  Procedure Laterality Date   HERNIA REPAIR     pt. was an infant when this surgery occured   NASAL SINUS SURGERY     THYROID SURGERY Right 02/28/2014   DR TEOH    PARTIAL THYROIDECTOMY   THYROIDECTOMY Right 02/28/2014   Procedure: RIGHT HEMI THYROIDECTOMY;  Surgeon: Ascencion Dike, MD;  [facility];   Service: ENT;  Laterality: Right;   WISDOM TOOTH EXTRACTION      Family History  Problem Relation Age of Onset   Hyperlipidemia Mother    Hypertension Mother    Thyroid disease Neg Hx     Medications- reviewed and updated Current Outpatient Medications  Medication Sig Dispense Refill   Amphet-Dextroamphet 3-Bead ER (MYDAYIS) 37.5 MG CP24 Take 1 capsule by mouth daily.     amphetamine-dextroamphetamine (ADDERALL XR) 30 MG 24 hr capsule Take 30 mg by mouth daily.      ARMOUR THYROID 120 MG tablet Take 120 mg by mouth daily before breakfast.      Ascorbic Acid (VITAMIN C) 1000 MG tablet Take 1,000 mg by mouth daily.     baclofen (LIORESAL) 10 MG tablet Take 10 mg by mouth 3 (three) times daily.     Cholecalciferol (VITAMIN D3 PO) Take 1 capsule by mouth daily.     clotrimazole-betamethasone (LOTRISONE) cream Apply 1 application topically 2 (two) times daily. 30 g 1   Cyanocobalamin (VITAMIN B-12 PO)      Magnesium Citrate 100 MG CAPS take 1 capsule by oral route every day     oxybutynin (DITROPAN) 5 MG tablet Take 5 mg by mouth at bedtime.     SUMAtriptan (IMITREX) 50 MG tablet Take 1 tablet (50 mg total) by mouth every 2 (two) hours as needed for migraine. May repeat in 2 hours if headache persists or recurs.  90 tablet 3   tadalafil (CIALIS) 20 MG tablet Take 0.5-1 tablets (10-20 mg total) by mouth every other day as needed for erectile dysfunction. 5 tablet 2   tamsulosin (FLOMAX) 0.4 MG CAPS capsule Take 0.4 mg by mouth daily.     triamcinolone cream (KENALOG) 0.1 % Apply 1 application topically 2 (two) times daily. 30 g 0   No current facility-administered medications for this visit.    Allergies-reviewed and updated Allergies  Allergen Reactions   Penicillins Anaphylaxis    Did it involve swelling of the face/tongue/throat, SOB, or low BP? Yes Did it involve sudden or severe rash/hives, skin peeling, or any reaction on the inside of your mouth or nose? Yes Did you need to  seek medical attention at a hospital or doctor's office? Yes When did it last happen?      42-67 years old If all above answers are NO, may proceed with cephalosporin use.    Topamax Other (See Comments)    FACE BILATERAL NUMBNESS   Milk-Related Compounds Other (See Comments)    GI issues Pt reports that he cannot drink milk but can tolerate other dairy products   Morphine And Related Itching    Full body itching    Social History   Social History Narrative   Married 2016. 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      HH of 1 pets dog and cats.      Objective:  There were no vitals taken for this visit. Gen: NAD, resting comfortably HEENT: Mucous membranes are moist. Oropharynx normal Neck: no thyromegaly CV: RRR no murmurs rubs or gallops Lungs: CTAB no crackles, wheeze, rhonchi Abdomen: soft/nontender/nondistended/normal bowel sounds. No rebound or guarding.  Ext: no edema Skin: warm, dry Neuro: grossly normal, moves all extremities, PERRLA ***    Assessment and Plan:  46 y.o. male presenting for annual physical.  Health Maintenance counseling: 1. Anticipatory guidance: Patient counseled regarding regular dental exams ***q6 months, eye exams ***,  avoiding smoking and second hand smoke*** , limiting alcohol to 2 beverages per day-social occasional.  ***, no illicit drugs***.   2. Risk factor reduction:  Advised patient of need for regular exercise and diet rich and fruits and vegetables to reduce risk of heart attack and stroke.  Exercise- discussed getting some walking in.***.  Diet/weight management-weight from last CPE stable- thinks reducing carbs would help. ***.  Wt Readings from Last 3 Encounters:  11/19/20 (!) 309 lb 6.4 oz (140.3 kg)  09/16/20 (!) 307 lb 9.6 oz (139.5 kg)  08/19/20 (!) 304 lb (137.9 kg)   3. Immunizations/screenings/ancillary  studies DISCUSSED:  -Flu vaccination - *** Immunization History  Administered Date(s) Administered   Td 07/01/2001   Tdap 12/12/2012   Health Maintenance Due  Topic Date Due   INFLUENZA VACCINE  Never done    Family History  Problem Relation Age of Onset   Hyperlipidemia Mother    Hypertension Mother    Thyroid disease Neg Hx    4. Prostate cancer screening- no family history, start at age 5 *** No results found for: PSA 5. Colon cancer screening -  no family history, start at age 10*** 24. Skin cancer screening/prevention-  saw dermatology early 2021. ***advised regular sunscreen use. Denies worrisome, changing, or new skin lesions.  7. Testicular cancer screening- advised monthly self exams *** 8. STD screening- patient opts  out as only with wife *** 9. Smoking associated screening- never smoker- ***  Status of chronic or acute concerns   ***Vitamin D recheck in spring or summer 2021 ***dr. Vertell Limber surgery 04/21/2019- also will do cervical spine 4/20222 ***Declines COVID-vaccine    #Cervical disc surgery- did well with surgery. Still had low back issues   # Obesity  S:had gained some weight recently and felt like he was carrying more- from last year up 8 lbs  approximately. Typically from 280s higher side to low 300s- this is on the higher end in general for him.  - he tried to improve diet some. Hard with daughters food pickiness- prepared foods for her and then easier to just eat those. He reduced fried foods- such as fries/hasbrowns. Switched soda to diet and caffeine free. Dranked 1-3 cups of water a day. These changed for 3-4 months.  -walked some perhaps once a week.  -also travelled fair amount and so eating when outside the home was challenging.   -Had gotten angry/mad with 2000 calorie diet in the past- felt hungry and does not work well for him.    -Wanted to discuss Mounjaro injections  discussed healthy weight to wellness referral or gastric bypass referral as  options as well but he wanted to hold off around the time.We also discussed possibly using Wegovy or Ozempic to help with weight loss if reached plateau.  -We did not get a chance to discuss around the time but I would like to get some home readings on blood pressure-he may need to come off of Adderall.  Hopefully weight trended down and blood pressure does to Wt Readings from Last 3 Encounters:  11/19/20 (!) 309 lb 6.4 oz (140.3 kg)  09/16/20 (!) 307 lb 9.6 oz (139.5 kg)  08/19/20 (!) 304 lb (137.9 kg)  A/P: ***   Dr. Francine Graven for "[facility] Diet" video on YouTube.  #elevated blood pressure readings S: medication: *** Home readings #s: *** BP Readings from Last 3 Encounters:  11/19/20 (!) 152/100  09/16/20 (!) 155/99  08/19/20 (!) 152/95  A/P: ***  #hyperlipidemia S: Medication: none  Lab Results  Component Value Date   CHOL 232 (H) 06/11/2020   HDL 42.30 06/11/2020   LDLCALC 165 (H) 06/11/2020   LDLDIRECT 158.1 07/04/2012   TRIG 126.0 06/11/2020   CHOLHDL 5 06/11/2020   A/P: ***  # Depression/ADD S: Medication:[place] [facility]- remains on adderall 30 mg XR and also mydays 37.5 mg together  Depression screen [facility] 2/9 11/19/2020 06/11/2020 06/20/2019  Decreased Interest 0 0 0  Down, Depressed, Hopeless 0 0 0  PHQ - 2 Score 0 0 0  Altered sleeping - 1 0  Tired, decreased energy - 1 0  Change in appetite - 0 0  Feeling bad or failure about yourself  - 0 0  Trouble concentrating - 3 0  Moving slowly or fidgety/restless - 1 0  Suicidal thoughts - 0 0  PHQ-9 Score - 6 0  Difficult doing work/chores - Very difficult Not difficult at all  Some recent data might be hidden   A/P: ***   #Vitamin D deficiency S: Medication:   10k units every day- has bene low in past- takes lower in summer Last vitamin D Lab Results  Component Value Date   VD25OH 81.16 06/11/2020   A/P: ***   # migraines - was doing really well lately- no refills since 2019. Some headaches with  cervical disc issues   #ED- cialis still  helpful   # LUTs- on flomax through urology  Recommended follow up: No follow-ups on file. Future Appointments  Date Time Provider [place]  06/16/2021  8:40 AM Marin Olp, MD [facility]    No chief complaint on file.  Lab/Order associations:*** fasting No diagnosis found.  No orders of the defined types were placed in this encounter.  I,Jada Bradford,acting as a scribe for Garret Reddish, MD.,have documented all relevant documentation on the behalf of Garret Reddish, MD,as directed by  Garret Reddish, MD while in the presence of Garret Reddish, MD.  ***  Return precautions advised.   Burnett Corrente

## 2021-06-16 ENCOUNTER — Ambulatory Visit (INDEPENDENT_AMBULATORY_CARE_PROVIDER_SITE_OTHER): Payer: Commercial Managed Care - PPO | Admitting: Family Medicine

## 2021-06-16 ENCOUNTER — Encounter: Payer: Self-pay | Admitting: Family Medicine

## 2021-06-16 VITALS — BP 128/84 | HR 58 | Temp 97.3°F | Ht 75.0 in | Wt 305.2 lb

## 2021-06-16 DIAGNOSIS — F325 Major depressive disorder, single episode, in full remission: Secondary | ICD-10-CM

## 2021-06-16 DIAGNOSIS — Z0000 Encounter for general adult medical examination without abnormal findings: Secondary | ICD-10-CM | POA: Diagnosis not present

## 2021-06-16 DIAGNOSIS — F909 Attention-deficit hyperactivity disorder, unspecified type: Secondary | ICD-10-CM | POA: Diagnosis not present

## 2021-06-16 DIAGNOSIS — E559 Vitamin D deficiency, unspecified: Secondary | ICD-10-CM

## 2021-06-16 DIAGNOSIS — R030 Elevated blood-pressure reading, without diagnosis of hypertension: Secondary | ICD-10-CM

## 2021-06-16 DIAGNOSIS — E785 Hyperlipidemia, unspecified: Secondary | ICD-10-CM | POA: Diagnosis not present

## 2021-06-16 LAB — COMPREHENSIVE METABOLIC PANEL
ALT: 24 U/L (ref 0–53)
AST: 17 U/L (ref 0–37)
Albumin: 4.7 g/dL (ref 3.5–5.2)
Alkaline Phosphatase: 75 U/L (ref 39–117)
BUN: 18 mg/dL (ref 6–23)
CO2: 25 mEq/L (ref 19–32)
Calcium: 9.3 mg/dL (ref 8.4–10.5)
Chloride: 107 mEq/L (ref 96–112)
Creatinine, Ser: 0.97 mg/dL (ref 0.40–1.50)
GFR: 95.7 mL/min (ref >60.00)
Glucose, Bld: 86 mg/dL (ref 70–99)
Potassium: 4.6 mEq/L (ref 3.5–5.1)
Sodium: 142 mEq/L (ref 135–145)
Total Bilirubin: 0.6 mg/dL (ref 0.2–1.2)
Total Protein: 6.7 g/dL (ref 6.0–8.3)

## 2021-06-16 LAB — CBC WITH DIFFERENTIAL/PLATELET
Basophils Absolute: 0.1 10*3/uL (ref 0.0–0.1)
Basophils Relative: 0.9 % (ref 0.0–3.0)
Eosinophils Absolute: 0.1 10*3/uL (ref 0.0–0.7)
Eosinophils Relative: 1.5 % (ref 0.0–5.0)
HCT: 48.3 % (ref 39.0–52.0)
Hemoglobin: 16.4 g/dL (ref 13.0–17.0)
Lymphocytes Relative: 36.9 % (ref 12.0–46.0)
Lymphs Abs: 2.6 10*3/uL (ref 0.7–4.0)
MCHC: 33.9 g/dL (ref 30.0–36.0)
MCV: 87.8 fl (ref 78.0–100.0)
Monocytes Absolute: 0.7 10*3/uL (ref 0.1–1.0)
Monocytes Relative: 9.9 % (ref 3.0–12.0)
Neutro Abs: 3.6 10*3/uL (ref 1.4–7.7)
Neutrophils Relative %: 50.8 % (ref 43.0–77.0)
Platelets: 302 10*3/uL (ref 150.0–400.0)
RBC: 5.5 Mil/uL (ref 4.22–5.81)
RDW: 13.7 % (ref 11.5–15.5)
WBC: 7.1 10*3/uL (ref 4.0–10.5)

## 2021-06-16 LAB — LIPID PANEL
Cholesterol: 223 mg/dL — ABNORMAL HIGH (ref 0–200)
HDL: 44.5 mg/dL (ref >39.00)
LDL Cholesterol: 158 mg/dL — ABNORMAL HIGH (ref 0–99)
NonHDL: 178.16
Total CHOL/HDL Ratio: 5
Triglycerides: 103 mg/dL (ref 0.0–149.0)
VLDL: 20.6 mg/dL (ref 0.0–40.0)

## 2021-06-16 LAB — TSH: TSH: 5.8 u[IU]/mL — ABNORMAL HIGH (ref 0.35–5.50)

## 2021-06-16 LAB — VITAMIN D 25 HYDROXY (VIT D DEFICIENCY, FRACTURES): VITD: 78.11 ng/mL (ref 30.00–100.00)

## 2021-06-16 NOTE — Patient Instructions (Addendum)
Please stop by lab before you go ?If you have mychart- we will send your results within 3 business days of [place] receiving them.  ?If you do not have mychart- we will call you about results within 5 business days of [place] receiving them.  ?*please also note that you will see labs on mychart as soon as they post. I will later go in and write notes on them- will say "notes from Dr. Yong Channel"  ? ?Recommended follow up: Return in about 6 months (around 12/17/2021) for followup or sooner if needed.Schedule b4 you leave. ?

## 2021-12-22 ENCOUNTER — Encounter: Payer: Self-pay | Admitting: *Deleted

## 2022-01-20 ENCOUNTER — Ambulatory Visit: Payer: Commercial Managed Care - PPO | Admitting: Family Medicine

## 2022-01-22 ENCOUNTER — Ambulatory Visit: Payer: Commercial Managed Care - PPO | Admitting: Family Medicine

## 2022-01-27 ENCOUNTER — Ambulatory Visit (INDEPENDENT_AMBULATORY_CARE_PROVIDER_SITE_OTHER): Payer: Commercial Managed Care - PPO | Admitting: Family Medicine

## 2022-01-27 ENCOUNTER — Encounter: Payer: Self-pay | Admitting: Family Medicine

## 2022-01-27 VITALS — BP 138/80 | HR 79 | Temp 97.2°F | Ht 75.0 in | Wt 292.4 lb

## 2022-01-27 DIAGNOSIS — E890 Postprocedural hypothyroidism: Secondary | ICD-10-CM

## 2022-01-27 DIAGNOSIS — B079 Viral wart, unspecified: Secondary | ICD-10-CM | POA: Diagnosis not present

## 2022-01-27 DIAGNOSIS — E669 Obesity, unspecified: Secondary | ICD-10-CM

## 2022-01-27 NOTE — Progress Notes (Signed)
Phone [phone] In person visit   Subjective:   Robert [name] is a 46 y.o. year old very pleasant male patient who presents for/with See problem oriented charting Chief Complaint  Patient presents with   freeze wart off    Pt wants wart on right ring finger frozen off.   Past Medical History-  Patient Active Problem List   Diagnosis Date Noted   Low back pain 06/02/2019    Priority: High   Spondylolysis, lumbar region 04/21/2019    Priority: High   Secondary polycythemia 09/09/2017    Priority: High   Lumbar back pain with radiculopathy affecting right lower extremity 08/29/2016    Priority: High   Hyperlipidemia, unspecified 10/22/2017    Priority: Medium    Depression, major 09/27/2014    Priority: Medium    Post-surgical hypothyroidism 06/20/2014    Priority: Medium    Anxiety state 01/11/2014    Priority: Medium    Obesity (BMI 30-39.9) 01/11/2014    Priority: Medium    ADHD (attention deficit hyperactivity disorder) 06/02/2011    Priority: Medium    Major depressive disorder with single episode, in full remission ([place]) 06/07/2019    Priority: Low   Cervical radiculopathy at C6 08/01/2018    Priority: Low   Radial nerve entrapment, right 07/04/2018    Priority: Low   Wart 02/27/2015    Priority: Low   Volar plate injury of finger 01/09/2015    Priority: Low   Rash 09/27/2014    Priority: Low   Erectile dysfunction 09/27/2014    Priority: Low   Vitamin D deficiency 07/23/2014    Priority: Low   S/P partial thyroidectomy 02/28/2014    Priority: Low   Cyst of mandible 12/22/2013    Priority: Low    Medications- reviewed and updated Current Outpatient Medications  Medication Sig Dispense Refill   amphetamine-dextroamphetamine (ADDERALL XR) 30 MG 24 hr capsule Take 30 mg by mouth daily.      ARMOUR THYROID 120 MG tablet Take 120 mg by mouth daily before breakfast.      Ascorbic Acid (VITAMIN C) 1000 MG tablet Take 1,000 mg by mouth daily.      baclofen (LIORESAL) 10 MG tablet Take 10 mg by mouth 3 (three) times daily.     Cholecalciferol (VITAMIN D3 PO) Take 1 capsule by mouth daily.     Cyanocobalamin (VITAMIN B-12 PO)      Methylphenidate HCl ER, PM, (JORNAY PM) 100 MG CP24 Take 100 mg by mouth at bedtime.     oxybutynin (DITROPAN) 5 MG tablet Take 5 mg by mouth at bedtime.     SUMAtriptan (IMITREX) 50 MG tablet Take 1 tablet (50 mg total) by mouth every 2 (two) hours as needed for migraine. May repeat in 2 hours if headache persists or recurs. 90 tablet 3   tadalafil (CIALIS) 20 MG tablet Take 0.5-1 tablets (10-20 mg total) by mouth every other day as needed for erectile dysfunction. 5 tablet 2   tamsulosin (FLOMAX) 0.4 MG CAPS capsule Take 0.4 mg by mouth daily.     tirzepatide [facility]) 5 MG/0.5ML Pen Inject 5 mg into the skin once a week. Dr. Paulo Fruit w/ [facility]     No current facility-administered medications for this visit.     Objective:  BP 138/80   Pulse 79   Temp (!) 97.2 F (36.2 C)   Ht '6\' 3"'$  (1.905 m)   Wt 292 lb 6.4 oz (132.6 kg)   SpO2 97%  BMI 36.55 kg/m  Gen: NAD, resting comfortably Skin: warm, dry, 2 to 3 mm verrucous appearing lesion on right ring finger/fourth finger-essentially located at DIP joint  Procedure note: Benefits and risks verbally discussed with patient 10 second freeze thaw cycle of cryotherapy performed  with liquid nitrogen to right 4th finger No complications.  Patient tolerated the procedure well other than mild pain. Gave handout on this from [facility] and we reviewed this content thoroughly [email or web address]     Assessment and Plan   #Concern for wart S:noted on right ring finger about a month ago. In the past with warts has not responded to over the counter treatments including freezes- does better with resolution with cryo -may have had one in similar spot but was gone for at least 2 years  A/P: Patient  does have a wart on the right fourth finger-treated with cryotherapy today-has had a good response in the past.  Aftercare discussed.  He tolerated procedure well.  # Obesity- trending down with mounjaro  up to 10 mg now with  plush care- having good success- feeling good on this. Not exercising- encouraged walking at minimum.  Congratulated him on efforts Wt Readings from Last 3 Encounters:  01/27/22 292 lb 6.4 oz (132.6 kg)  06/16/21 (!) 305 lb 3.2 oz (138.4 kg)  11/19/20 (!) 309 lb 6.4 oz (140.3 kg)   #hypothyroidism S: compliant On thyroid medication-Armour Thyroid 120 mg through [facility] -He reports he received no follow-up from [place] after last visit-no adjustments have been made and he has not had subsequent blood work-information was faxed to their office-Per 06/18/2021 lab note Lab Results  Component Value Date   TSH 5.80 (H) 06/16/2021   A/P: Thyroid slightly undertreated on last check-we will update TSH-our lab was not available today so he will come back for this-if thyroid is in fact undertreated could explain why he feels like he has had a more difficult time losing weight on Mounjaro -For now continue current medication  Recommended follow up: Return for next already scheduled visit or sooner if needed. Future Appointments  Date Time Provider [place]  01/29/2022 11:00 AM [facility]  06/18/2022  9:00 AM Marin Olp, MD [facility]   Lab/Order associations:   ICD-10-CM   1. Viral wart on finger  B07.9     2. Post-surgical hypothyroidism  E89.0 TSH       Return precautions advised.  Garret Reddish, MD

## 2022-01-27 NOTE — Patient Instructions (Addendum)
Schedule visit with lab later this week or next week  Give this a few weeks- sometimes after heals over wart still present and needs a second freeze  See handout  Recommended follow up: Return for next already scheduled visit or sooner if needed.

## 2022-01-29 ENCOUNTER — Other Ambulatory Visit: Payer: Commercial Managed Care - PPO

## 2022-02-09 ENCOUNTER — Telehealth: Payer: Commercial Managed Care - PPO | Admitting: Physician Assistant

## 2022-02-09 DIAGNOSIS — B9689 Other specified bacterial agents as the cause of diseases classified elsewhere: Secondary | ICD-10-CM

## 2022-02-09 DIAGNOSIS — J208 Acute bronchitis due to other specified organisms: Secondary | ICD-10-CM

## 2022-02-09 MED ORDER — AZITHROMYCIN 250 MG PO TABS: ORAL_TABLET | ORAL | 0 refills | Status: AC

## 2022-02-09 MED ORDER — BENZONATATE 100 MG PO CAPS: 100.0000 mg | ORAL_CAPSULE | Freq: Three times a day (TID) | ORAL | 0 refills | Status: DC | PRN

## 2022-02-09 NOTE — Progress Notes (Signed)
We are sorry that you are not feeling well.  Here is how we plan to help!  Based on your presentation I believe you most likely have A cough due to bacteria.  When patients have a fever and a productive cough with a change in color or increased sputum production, we are concerned about bacterial bronchitis.  If left untreated it can progress to pneumonia.  If your symptoms do not improve with your treatment plan it is important that you contact your provider.   I have prescribed Azithromyin 250 mg: two tablets now and then one tablet daily for 4 additonal days    In addition you may use A non-prescription cough medication called Mucinex DM: take 2 tablets every 12 hours. and A prescription cough medication called Tessalon Perles '100mg'$ . You may take 1-2 capsules every 8 hours as needed for your cough.   From your responses in the eVisit questionnaire you describe inflammation in the upper respiratory tract which is causing a significant cough.  This is commonly called Bronchitis and has four common causes:   Allergies Viral Infections Acid Reflux Bacterial Infection Allergies, viruses and acid reflux are treated by controlling symptoms or eliminating the cause. An example might be a cough caused by taking certain blood pressure medications. You stop the cough by changing the medication. Another example might be a cough caused by acid reflux. Controlling the reflux helps control the cough.     HOME CARE Only take medications as instructed by your medical team. Complete the entire course of an antibiotic. Drink plenty of fluids and get plenty of rest. Avoid close contacts especially the very young and the elderly Cover your mouth if you cough or cough into your sleeve. Always remember to wash your hands A steam or ultrasonic humidifier can help congestion.   GET HELP RIGHT AWAY IF: You develop worsening fever. You become short of breath You cough up blood. Your symptoms persist after you  have completed your treatment plan MAKE SURE YOU  Understand these instructions. Will watch your condition. Will get help right away if you are not doing well or get worse.    Thank you for choosing an e-visit.  Your e-visit answers were reviewed by a board certified advanced clinical practitioner to complete your personal care plan. Depending upon the condition, your plan could have included both over the counter or prescription medications.  Please review your pharmacy choice. Make sure the pharmacy is open so you can pick up prescription now. If there is a problem, you may contact your provider through [employer] and have the prescription routed to another pharmacy.  Your safety is important to [place]. If you have drug allergies check your prescription carefully.   For the next 24 hours you can use MyChart to ask questions about today's visit, request a non-urgent call back, or ask for a work or school excuse. You will get an email in the next two days asking about your experience. I hope that your e-visit has been valuable and will speed your recovery.  I have spent 5 minutes in review of e-visit questionnaire, review and updating patient chart, medical decision making and response to patient.   Mar Daring, PA-C

## 2022-03-14 ENCOUNTER — Telehealth: Payer: Commercial Managed Care - PPO | Admitting: Nurse Practitioner

## 2022-03-14 DIAGNOSIS — R6889 Other general symptoms and signs: Secondary | ICD-10-CM | POA: Diagnosis not present

## 2022-03-14 DIAGNOSIS — B079 Viral wart, unspecified: Secondary | ICD-10-CM | POA: Diagnosis not present

## 2022-03-14 DIAGNOSIS — E890 Postprocedural hypothyroidism: Secondary | ICD-10-CM | POA: Diagnosis not present

## 2022-03-14 MED ORDER — OSELTAMIVIR PHOSPHATE 75 MG PO CAPS: 75.0000 mg | ORAL_CAPSULE | Freq: Two times a day (BID) | ORAL | 0 refills | Status: DC

## 2022-03-14 NOTE — Progress Notes (Signed)
Virtual Visit Consent   Robert [name], you are scheduled for a virtual visit with [name], FNP, a [facility] provider, today.     Just as with appointments in the office, your consent must be obtained to participate.  Your consent will be active for this visit and any virtual visit you may have with one of our providers in the next 365 days.     If you have a MyChart account, a copy of this consent can be sent to you electronically.  All virtual visits are billed to your insurance company just like a traditional visit in the office.    As this is a virtual visit, video technology does not allow for your provider to perform a traditional examination.  This may limit your provider's ability to fully assess your condition.  If your provider identifies any concerns that need to be evaluated in person or the need to arrange testing (such as labs, EKG, etc.), we will make arrangements to do so.     Although advances in technology are sophisticated, we cannot ensure that it will always work on either your end or our end.  If the connection with a video visit is poor, the visit may have to be switched to a telephone visit.  With either a video or telephone visit, we are not always able to ensure that we have a secure connection.     I need to obtain your verbal consent now.   Are you willing to proceed with your visit today? YES   Phillips LAYNE DILAURO has provided verbal consent on 03/14/2022 for a virtual visit (video or telephone).   [name], FNP   Date: 03/14/2022 8:47 AM   Virtual Visit via Video Note   I, [name], connected with Robert [name] ([id number], Jun 11, 1975) on 03/14/22 at  9:00 AM EST by a video-enabled telemedicine application and verified that I am speaking with the correct person using two identifiers.  Location: Patient: Virtual Visit Location Patient: Home Provider: Virtual Visit Location Provider: Mobile   I discussed the  limitations of evaluation and management by telemedicine and the availability of in person appointments. The patient expressed understanding and agreed to proceed.    History of Present Illness: Robert [name] is a 46 y.o. who identifies as a male who was assigned male at birth, and is being seen today for uri.  HPI: URI  This is a new problem. Episode onset: Thursday. The problem has been gradually worsening. There has been no fever. Associated symptoms include congestion, coughing, rhinorrhea and a sore throat. Pertinent negatives include no headaches. Associated symptoms comments: Body aches. Treatments tried: zycam. The treatment provided mild relief.    Review of Systems  HENT:  Positive for congestion, rhinorrhea and sore throat.   Respiratory:  Positive for cough.   Neurological:  Negative for headaches.    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   Cervical radiculopathy at C6 08/01/2018   Radial nerve entrapment, right 07/04/2018   Hyperlipidemia, unspecified 10/22/2017   Secondary polycythemia 09/09/2017   Lumbar back pain with radiculopathy affecting right lower extremity 08/29/2016   Wart 02/27/2015   Volar plate injury of finger 01/09/2015   Depression, major 09/27/2014   Rash 09/27/2014   Erectile dysfunction 09/27/2014   Vitamin D deficiency 07/23/2014  Post-surgical hypothyroidism 06/20/2014   S/P partial thyroidectomy 02/28/2014   Anxiety state 01/11/2014   Obesity (BMI 30-39.9) 01/11/2014   Cyst of mandible 12/22/2013   ADHD (attention deficit hyperactivity disorder) 06/02/2011    Allergies:  Allergies  Allergen Reactions   Penicillins Anaphylaxis    Did it involve swelling of the face/tongue/throat, SOB, or low BP? Yes Did it involve sudden or severe rash/hives, skin peeling, or  any reaction on the inside of your mouth or nose? Yes Did you need to seek medical attention at a hospital or doctor's office? Yes When did it last happen?      11-60 years old If all above answers are "NO", may proceed with cephalosporin use.    Topamax Other (See Comments)    FACE BILATERAL NUMBNESS   Milk-Related Compounds Other (See Comments)    GI issues Pt reports that he cannot drink milk but can tolerate other dairy products   Morphine And Related Itching    Full body itching   Medications:  Current Outpatient Medications:    amphetamine-dextroamphetamine (ADDERALL XR) 30 MG 24 hr capsule, Take 30 mg by mouth daily. , Disp: , Rfl:    ARMOUR THYROID 120 MG tablet, Take 120 mg by mouth daily before breakfast. , Disp: , Rfl:    Ascorbic Acid (VITAMIN C) 1000 MG tablet, Take 1,000 mg by mouth daily., Disp: , Rfl:    baclofen (LIORESAL) 10 MG tablet, Take 10 mg by mouth 3 (three) times daily., Disp: , Rfl:    benzonatate (TESSALON) 100 MG capsule, Take 1 capsule (100 mg total) by mouth 3 (three) times daily as needed., Disp: 30 capsule, Rfl: 0   Cholecalciferol (VITAMIN D3 PO), Take 1 capsule by mouth daily., Disp: , Rfl:    Cyanocobalamin (VITAMIN B-12 PO), , Disp: , Rfl:    Methylphenidate HCl ER, PM, (JORNAY PM) 100 MG CP24, Take 100 mg by mouth at bedtime., Disp: , Rfl:    oxybutynin (DITROPAN) 5 MG tablet, Take 5 mg by mouth at bedtime., Disp: , Rfl:    SUMAtriptan (IMITREX) 50 MG tablet, Take 1 tablet (50 mg total) by mouth every 2 (two) hours as needed for migraine. May repeat in 2 hours if headache persists or recurs., Disp: 90 tablet, Rfl: 3   tadalafil (CIALIS) 20 MG tablet, Take 0.5-1 tablets (10-20 mg total) by mouth every other day as needed for erectile dysfunction., Disp: 5 tablet, Rfl: 2   tamsulosin (FLOMAX) 0.4 MG CAPS capsule, Take 0.4 mg by mouth daily., Disp: , Rfl:    tirzepatide (MOUNJARO) 5 MG/0.5ML Pen, Inject 5 mg into the skin once a week. Dr. Paulo Fruit w/ [facility], Disp: , Rfl:   Observations/Objective: Patient is well-developed, well-nourished in no acute distress.  Resting comfortably  at home.  Head is normocephalic, atraumatic.  No labored breathing.  Speech is clear and coherent with logical content.  Patient is alert and oriented at baseline.  Raspy voice Wet cough  Assessment and Plan:  Robert [name] in today with chief complaint of URI   1. Flu-like symptoms 1. Take meds as prescribed 2. Use a cool mist humidifier especially during the winter months and when heat has been humid. 3. Use saline nose sprays frequently 4. Saline irrigations of the nose can be very helpful if done frequently.  * 4X daily for 1 week*  * Use of a nettie pot can be helpful with this. Follow directions with this* 5. Drink plenty of fluids 6.  Keep thermostat turn down low 7.For any cough or congestion- mucinex or delsym 8. For fever or aces or pains- take tylenol or ibuprofen appropriate for age and weight.  * for fevers greater than 101 orally you may alternate ibuprofen and tylenol every  3 hours.     Meds ordered this encounter  Medications   oseltamivir (TAMIFLU) 75 MG capsule    Sig: Take 1 capsule (75 mg total) by mouth 2 (two) times daily.    Dispense:  10 capsule    Refill:  0    Order Specific Question:   Supervising Provider    Answer:   Chase Picket [id number]      Follow Up Instructions: I discussed the assessment and treatment plan with the patient. The patient was provided an opportunity to ask questions and all were answered. The patient agreed with the plan and demonstrated an understanding of the instructions.  A copy of instructions were sent to the patient via MyChart.  The patient was advised to call back or seek an in-person evaluation if the symptoms worsen or if the condition fails to improve as anticipated.  Time:  I spent 8 minutes with the patient via telehealth technology discussing the above  problems/concerns.    [name], FNP

## 2022-03-14 NOTE — Patient Instructions (Signed)
Robert [name], thank you for joining Chevis Pretty, FNP for today's virtual visit.  While this provider is not your primary care provider (PCP), if your PCP is located in our provider database this encounter information will be shared with them immediately following your visit.   [facility] account gives you access to today's visit and all your visits, tests, and labs performed at [facility] " click here if you don't have a [facility] account or go to [email or web address]  Consent: (Patient) Robert [name] provided verbal consent for this virtual visit at the beginning of the encounter.  Current Medications:  Current Outpatient Medications:    oseltamivir (TAMIFLU) 75 MG capsule, Take 1 capsule (75 mg total) by mouth 2 (two) times daily., Disp: 10 capsule, Rfl: 0   amphetamine-dextroamphetamine (ADDERALL XR) 30 MG 24 hr capsule, Take 30 mg by mouth daily. , Disp: , Rfl:    ARMOUR THYROID 120 MG tablet, Take 120 mg by mouth daily before breakfast. , Disp: , Rfl:    Ascorbic Acid (VITAMIN C) 1000 MG tablet, Take 1,000 mg by mouth daily., Disp: , Rfl:    baclofen (LIORESAL) 10 MG tablet, Take 10 mg by mouth 3 (three) times daily., Disp: , Rfl:    benzonatate (TESSALON) 100 MG capsule, Take 1 capsule (100 mg total) by mouth 3 (three) times daily as needed., Disp: 30 capsule, Rfl: 0   Cholecalciferol (VITAMIN D3 PO), Take 1 capsule by mouth daily., Disp: , Rfl:    Cyanocobalamin (VITAMIN B-12 PO), , Disp: , Rfl:    Methylphenidate HCl ER, PM, (JORNAY PM) 100 MG CP24, Take 100 mg by mouth at bedtime., Disp: , Rfl:    oxybutynin (DITROPAN) 5 MG tablet, Take 5 mg by mouth at bedtime., Disp: , Rfl:    SUMAtriptan (IMITREX) 50 MG tablet, Take 1 tablet (50 mg total) by mouth every 2 (two) hours as needed for migraine. May repeat in 2 hours if headache persists or recurs., Disp: 90 tablet, Rfl: 3   tadalafil (CIALIS) 20 MG tablet, Take 0.5-1 tablets (10-20  mg total) by mouth every other day as needed for erectile dysfunction., Disp: 5 tablet, Rfl: 2   tamsulosin (FLOMAX) 0.4 MG CAPS capsule, Take 0.4 mg by mouth daily., Disp: , Rfl:    tirzepatide (MOUNJARO) 5 MG/0.5ML Pen, Inject 5 mg into the skin once a week. Dr. Paulo Fruit w/ [facility], Disp: , Rfl:    Medications ordered in this encounter:  Meds ordered this encounter  Medications   oseltamivir (TAMIFLU) 75 MG capsule    Sig: Take 1 capsule (75 mg total) by mouth 2 (two) times daily.    Dispense:  10 capsule    Refill:  0    Order Specific Question:   Supervising Provider    Answer:   Chase Picket [id number]     *If you need refills on other medications prior to your next appointment, please contact your pharmacy*  Follow-Up: Call back or seek an in-person evaluation if the symptoms worsen or if the condition fails to improve as anticipated.  [place] [phone]  Other Instructions 1. Take meds as prescribed 2. Use a cool mist humidifier especially during the winter months and when heat has been humid. 3. Use saline nose sprays frequently 4. Saline irrigations of the nose can be very helpful if done frequently.  * 4X daily for 1 week*  * Use of a nettie pot can be helpful  with this. Follow directions with this* 5. Drink plenty of fluids 6. Keep thermostat turn down low 7.For any cough or congestion- mucinex and delsym 8. For fever or aces or pains- take tylenol or ibuprofen appropriate for age and weight.  * for fevers greater than 101 orally you may alternate ibuprofen and tylenol every  3 hours.      If you have been instructed to have an in-person evaluation today at a local Urgent Care facility, please use the link below. It will take you to a list of all of our available [facility], including address, phone number and hours of operation. Please do not delay care.  [facility]  If you or a family member do not have a  primary care provider, use the link below to schedule a visit and establish care. When you choose a [facility] primary care physician or advanced practice provider, you gain a long-term partner in health. Find a Primary Care Provider  Learn more about [facility]'s in-office and virtual care options: [facility]

## 2022-06-18 ENCOUNTER — Ambulatory Visit (INDEPENDENT_AMBULATORY_CARE_PROVIDER_SITE_OTHER): Payer: Commercial Managed Care - PPO | Admitting: Family Medicine

## 2022-06-18 ENCOUNTER — Encounter: Payer: Self-pay | Admitting: Family Medicine

## 2022-06-18 VITALS — BP 136/88 | HR 74 | Temp 98.1°F | Ht 75.0 in | Wt 282.4 lb

## 2022-06-18 DIAGNOSIS — Z0000 Encounter for general adult medical examination without abnormal findings: Secondary | ICD-10-CM | POA: Diagnosis not present

## 2022-06-18 DIAGNOSIS — R7989 Other specified abnormal findings of blood chemistry: Secondary | ICD-10-CM

## 2022-06-18 DIAGNOSIS — E785 Hyperlipidemia, unspecified: Secondary | ICD-10-CM | POA: Diagnosis not present

## 2022-06-18 DIAGNOSIS — E890 Postprocedural hypothyroidism: Secondary | ICD-10-CM

## 2022-06-18 DIAGNOSIS — E559 Vitamin D deficiency, unspecified: Secondary | ICD-10-CM | POA: Diagnosis not present

## 2022-06-18 LAB — LIPID PANEL
Cholesterol: 197 mg/dL (ref 0–200)
HDL: 46.6 mg/dL (ref >39.00)
LDL Cholesterol: 134 mg/dL — ABNORMAL HIGH (ref 0–99)
NonHDL: 149.95
Total CHOL/HDL Ratio: 4
Triglycerides: 81 mg/dL (ref 0.0–149.0)
VLDL: 16.2 mg/dL (ref 0.0–40.0)

## 2022-06-18 LAB — CBC WITH DIFFERENTIAL/PLATELET
Basophils Absolute: 0 10*3/uL (ref 0.0–0.1)
Basophils Relative: 0.7 % (ref 0.0–3.0)
Eosinophils Absolute: 0.1 10*3/uL (ref 0.0–0.7)
Eosinophils Relative: 1.6 % (ref 0.0–5.0)
HCT: 48.7 % (ref 39.0–52.0)
Hemoglobin: 16.6 g/dL (ref 13.0–17.0)
Lymphocytes Relative: 33.5 % (ref 12.0–46.0)
Lymphs Abs: 2 10*3/uL (ref 0.7–4.0)
MCHC: 34.1 g/dL (ref 30.0–36.0)
MCV: 85.8 fl (ref 78.0–100.0)
Monocytes Absolute: 0.7 10*3/uL (ref 0.1–1.0)
Monocytes Relative: 10.7 % (ref 3.0–12.0)
Neutro Abs: 3.2 10*3/uL (ref 1.4–7.7)
Neutrophils Relative %: 53.5 % (ref 43.0–77.0)
Platelets: 319 10*3/uL (ref 150.0–400.0)
RBC: 5.68 Mil/uL (ref 4.22–5.81)
RDW: 13.4 % (ref 11.5–15.5)
WBC: 6.1 10*3/uL (ref 4.0–10.5)

## 2022-06-18 LAB — COMPREHENSIVE METABOLIC PANEL
ALT: 25 U/L (ref 0–53)
AST: 17 U/L (ref 0–37)
Albumin: 4.6 g/dL (ref 3.5–5.2)
Alkaline Phosphatase: 75 U/L (ref 39–117)
BUN: 18 mg/dL (ref 6–23)
CO2: 27 mEq/L (ref 19–32)
Calcium: 9.4 mg/dL (ref 8.4–10.5)
Chloride: 105 mEq/L (ref 96–112)
Creatinine, Ser: 0.99 mg/dL (ref 0.40–1.50)
GFR: 92.73 mL/min (ref >60.00)
Glucose, Bld: 85 mg/dL (ref 70–99)
Potassium: 4.8 mEq/L (ref 3.5–5.1)
Sodium: 139 mEq/L (ref 135–145)
Total Bilirubin: 1 mg/dL (ref 0.2–1.2)
Total Protein: 6.7 g/dL (ref 6.0–8.3)

## 2022-06-18 LAB — TSH: TSH: 0.02 u[IU]/mL — ABNORMAL LOW (ref 0.35–5.50)

## 2022-06-18 LAB — TESTOSTERONE: Testosterone: 443.62 ng/dL (ref 300.00–890.00)

## 2022-06-18 LAB — T4, FREE: Free T4: 0.8 ng/dL (ref 0.60–1.60)

## 2022-06-18 LAB — T3, FREE: T3, Free: 4.7 pg/mL — ABNORMAL HIGH (ref 2.3–4.2)

## 2022-06-18 LAB — VITAMIN D 25 HYDROXY (VIT D DEFICIENCY, FRACTURES): VITD: 85 ng/mL (ref 30.00–100.00)

## 2022-06-18 MED ORDER — SUMATRIPTAN SUCCINATE 50 MG PO TABS: 50.0000 mg | ORAL_TABLET | ORAL | 3 refills | Status: AC | PRN

## 2022-06-18 NOTE — Patient Instructions (Addendum)
Please stop by lab before you go If you have mychart- we will send your results within 3 business days of [place] receiving them.  If you do not have mychart- we will call you about results within 5 business days of [place] receiving them.  *please also note that you will see labs on mychart as soon as they post. I will later go in and write notes on them- will say "notes from Dr. Yong Channel"   Recommended follow up: Return in about 1 year (around 06/18/2023) for physical or sooner if needed.Schedule b4 you leave.

## 2022-06-18 NOTE — Progress Notes (Signed)
Phone: [phone]    Subjective:  Patient presents today for their annual physical. Chief complaint-noted.   See problem oriented charting- ROS- full  review of systems was completed and negative  except for: congestion, runny nose, sore throat, cough- has a cold- declines covid testing, ongoing back issues, ongoing concentration issues  The following were reviewed and entered/updated in epic: Past Medical History:  Diagnosis Date   ADD (attention deficit disorder) without hyperactivity    according to his mother/ work test possible   Anxiety    Chronic sinusitis    Depression    Headache    History of kidney stones    passed stone   Hyperlipidemia    no meds, diet controlled   Hypertension    recently dx 2 months ago, MD just monitoring, no meds at this time   Hypothyroidism    Sleep apnea    Uses CPAP nightly.   Patient Active Problem List   Diagnosis Date Noted   Low back pain 06/02/2019    Priority: High   Spondylolysis, lumbar region 04/21/2019    Priority: High   Secondary polycythemia 09/09/2017    Priority: High   Lumbar back pain with radiculopathy affecting right lower extremity 08/29/2016    Priority: High   Hyperlipidemia, unspecified 10/22/2017    Priority: Medium    Depression, major 09/27/2014    Priority: Medium    Vitamin D deficiency 07/23/2014    Priority: Medium    Post-surgical hypothyroidism 06/20/2014    Priority: Medium    Anxiety state 01/11/2014    Priority: Medium    Obesity (BMI 30-39.9) 01/11/2014    Priority: Medium    ADHD (attention deficit hyperactivity disorder) 06/02/2011    Priority: Medium    Major depressive disorder with single episode, in full remission ([facility]) 06/07/2019    Priority: Low   Cervical radiculopathy at C6 08/01/2018    Priority: Low   Radial nerve entrapment, right 07/04/2018    Priority: Low   Wart 02/27/2015    Priority: Low   Volar plate injury of finger 01/09/2015    Priority: Low   Rash  09/27/2014    Priority: Low   Erectile dysfunction 09/27/2014    Priority: Low   S/P partial thyroidectomy 02/28/2014    Priority: Low   Cyst of mandible 12/22/2013    Priority: Low   Past Surgical History:  Procedure Laterality Date   HERNIA REPAIR     pt. was an infant when this surgery occured   NASAL SINUS SURGERY     THYROID SURGERY Right 02/28/2014   DR TEOH    PARTIAL THYROIDECTOMY   THYROIDECTOMY Right 02/28/2014   Procedure: RIGHT HEMI THYROIDECTOMY;  Surgeon: Ascencion Dike, MD;  Location: [facility] OR;  Service: ENT;  Laterality: Right;   WISDOM TOOTH EXTRACTION      Family History  Problem Relation Age of Onset   Hyperlipidemia Mother    Hypertension Mother    Early death Father        multiple system organ failure   Liver disease Father        around 2016. cirrhosis from Hep C through blood transfusion   Pancreatitis Father        led to multiple system organ failure   Diabetes Maternal Grandmother    Obesity Maternal Grandmother    Arthritis Paternal Grandmother    Cancer Paternal Grandmother    Diabetes Paternal Grandmother    Thyroid disease Neg Hx  Medications- reviewed and updated Current Outpatient Medications  Medication Sig Dispense Refill   amphetamine-dextroamphetamine (ADDERALL XR) 30 MG 24 hr capsule Take 30 mg by mouth daily.      ARMOUR THYROID 120 MG tablet Take 120 mg by mouth daily before breakfast.      Ascorbic Acid (VITAMIN C) 1000 MG tablet Take 1,000 mg by mouth daily.     baclofen (LIORESAL) 10 MG tablet Take 10 mg by mouth 3 (three) times daily.     Cholecalciferol (VITAMIN D3 PO) Take 1 capsule by mouth daily.     tadalafil (CIALIS) 20 MG tablet Take 0.5-1 tablets (10-20 mg total) by mouth every other day as needed for erectile dysfunction. 5 tablet 2   tamsulosin (FLOMAX) 0.4 MG CAPS capsule Take 0.4 mg by mouth daily.     tirzepatide [facility]) 5 MG/0.5ML Pen Inject 7.5 mg into the skin once a week. Dr. Paulo Fruit w/ [facility]      SUMAtriptan (IMITREX) 50 MG tablet Take 1 tablet (50 mg total) by mouth every 2 (two) hours as needed for migraine. May repeat in 2 hours if headache persists or recurs. 90 tablet 3   No current facility-administered medications for this visit.    Allergies-reviewed and updated Allergies  Allergen Reactions   Penicillins Anaphylaxis    Did it involve swelling of the face/tongue/throat, SOB, or low BP? Yes Did it involve sudden or severe rash/hives, skin peeling, or any reaction on the inside of your mouth or nose? Yes Did you need to seek medical attention at a hospital or doctor's office? Yes When did it last happen?      20-75 years old If all above answers are "NO", may proceed with cephalosporin use.    Topamax Other (See Comments)    FACE BILATERAL NUMBNESS   Milk-Related Compounds Other (See Comments)    GI issues Pt reports that he cannot drink milk but can tolerate other dairy products   Morphine And Related Itching    Full body itching    Social History   Social History Narrative   Married 2016. 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      HH of 1 pets dog and cats.      Objective:  BP (!) 140/80   Pulse 74   Temp 98.1 F (36.7 C)   Ht 6\' 3"  (1.905 m)   Wt 282 lb 6.4 oz (128.1 kg)   SpO2 96%   BMI 35.30 kg/m  Gen: NAD, resting comfortably HEENT: Mucous membranes are moist. Oropharynx normal Neck: no thyromegaly CV: RRR no murmurs rubs or gallops Lungs: CTAB no crackles, wheeze, rhonchi Abdomen: soft/nontender/nondistended/normal bowel sounds. No rebound or guarding.  Ext: no edema Skin: warm, dry Neuro: grossly normal, moves all extremities, PERRLA    Assessment and Plan:  47 y.o. male presenting for annual physical.  Health Maintenance counseling: 1. Anticipatory guidance: Patient counseled regarding regular dental exams -q6 months, eye exams  - yearly,  avoiding smoking and second hand smoke , limiting alcohol to 2 beverages per day- may go a month or two with no drinks at all- rare, no illicit drugs.   2. Risk factor reduction:  Advised patient of need for regular exercise and diet rich and fruits and vegetables to reduce risk of heart attack and stroke.  Exercise- 1-2 days a week walking around neighborhood and back  tolerating- feels can increase.  Diet/weight management-down 23 lbs in last year on our scales- working with mounjaro 7.5- and was down 10 lbs from peak so over 30 lbs- on his count 36 lbs.  Wt Readings from Last 3 Encounters:  06/18/22 282 lb 6.4 oz (128.1 kg)  01/27/22 292 lb 6.4 oz (132.6 kg)  06/16/21 (!) 305 lb 3.2 oz (138.4 kg)  3. Immunizations/screenings/ancillary studies- holding off on flu and covid shot- Tdap will be due after september Immunization History  Administered Date(s) Administered   Td 07/01/2001   Tdap 12/12/2012  4. Prostate cancer screening- no family history, start at age 58     5. Colon cancer screening -  no family history, start at age 60. No polyp history- potentially interested in cologuard 6. Skin cancer screening/prevention-  saw dermatology about 2 years ago - no recent issues. advised regular sunscreen use. Denies worrisome, changing, or new skin lesions.  7. Testicular cancer screening- advised monthly self exams  8. STD screening- patient opts out as only with wife  9. Smoking associated screening- never smoker  Status of chronic or acute concerns   # social update- still enjoying new home! Daughter and wife doing well- wife In real estate 8 closings this month  # Cervical disc surgery in 2022 as well as low back surgery in 2021 with Dr. Rodena Medin with pain issues in the low back.  Neck seems to do reasonably well.  #hyperlipidemia S: Medication: None-10-year ASCVD risk 3% The 10-year ASCVD risk score (Arnett DK, et al., 2019) is: 3%  Lab Results  Component Value Date    CHOL 223 (H) 06/16/2021   HDL 44.50 06/16/2021   LDLCALC 158 (H) 06/16/2021   LDLDIRECT 158.1 07/04/2012   TRIG 103.0 06/16/2021   CHOLHDL 5 06/16/2021   A/P: Under threshold to start medication-continue efforts for healthy eating/regular exercise-also hoping for some improvement with weight loss  # Depression/ADD-follows with [place] attention specialist-on Adderall 30 mg extended release- imperfect control.  Doing well without medication for depression-full remission -prior HR/blood pressure issues on Mydayis    06/18/2022    9:01 AM 01/27/2022    1:21 PM 06/16/2021    8:50 AM  Depression screen PHQ 2/9  Decreased Interest 0 0 0  Down, Depressed, Hopeless 0 0 0  PHQ - 2 Score 0 0 0  Altered sleeping 0 0   Tired, decreased energy 0 0   Change in appetite 0 0   Feeling bad or failure about yourself  0 0   Trouble concentrating 3 0   Moving slowly or fidgety/restless 0 0   Suicidal thoughts  0   PHQ-9 Score 3 0   Difficult doing work/chores Not difficult at all Not difficult at all    #Vitamin D deficiency S: Medication: 10,000 units daily with lower dose in the summer Last vitamin D Lab Results  Component Value Date   VD25OH 78.11 06/16/2021  A/P: Hopefuly stable update vitamin D with labs today. Continue current meds for now.  #hypothyroidism S: compliant On thyroid medication-[facility]our Thyroid 120 mg to [facility] Lab Results  Component Value Date   TSH 5.80 (H) 06/16/2021  A/P:Hopefuly  improved- update TSH with labs today. Continue current meds for now.   # Migraines-no recent issues- outside of add med changes - did run low due to that- will refill  # ED-Cialis still helpful when needed- doesn't always need   # Lower urinary tract symptoms-Flomax through urology   #prior low  testosterone- off therapy- update TSH with labs  #high acceptable blood pressure - he is aware that stimulants likely contribute- he tries to take break on weekend on these and  wants to continue to work on weight loss and increasing exercise  Recommended follow up: Return in about 1 year (around 06/18/2023) for physical or sooner if needed.Schedule b4 you leave.  Lab/Order associations: fasting   ICD-10-CM   1. Preventative health care  Z00.00 CBC with Differential/Platelet    Comprehensive metabolic panel    Lipid panel    TSH    Vitamin D (25 hydroxy)    T4, free    T3, free    Testosterone    2. Hyperlipidemia, unspecified hyperlipidemia type  E78.5 CBC with Differential/Platelet    Comprehensive metabolic panel    Lipid panel    3. Post-surgical hypothyroidism  E89.0 TSH    T4, free    T3, free    4. Vitamin D deficiency  E55.9 Vitamin D (25 hydroxy)    5. Low testosterone  R79.89 Testosterone      Meds ordered this encounter  Medications   SUMAtriptan (IMITREX) 50 MG tablet    Sig: Take 1 tablet (50 mg total) by mouth every 2 (two) hours as needed for migraine. May repeat in 2 hours if headache persists or recurs.    Dispense:  90 tablet    Refill:  3   Return precautions advised.  Garret Reddish, MD

## 2022-09-08 ENCOUNTER — Other Ambulatory Visit (HOSPITAL_BASED_OUTPATIENT_CLINIC_OR_DEPARTMENT_OTHER): Payer: Self-pay

## 2022-09-08 ENCOUNTER — Other Ambulatory Visit (HOSPITAL_COMMUNITY): Payer: Self-pay

## 2022-09-08 MED ORDER — MOUNJARO 12.5 MG/0.5ML ~~LOC~~ SOAJ
12.5000 mg | SUBCUTANEOUS | 2 refills | Status: AC
  Filled 2022-09-08 – 2022-09-10 (×4): qty 2, 28d supply, fill #0

## 2022-09-09 ENCOUNTER — Other Ambulatory Visit (HOSPITAL_BASED_OUTPATIENT_CLINIC_OR_DEPARTMENT_OTHER): Payer: Self-pay

## 2022-09-10 ENCOUNTER — Other Ambulatory Visit (HOSPITAL_BASED_OUTPATIENT_CLINIC_OR_DEPARTMENT_OTHER): Payer: Self-pay

## 2022-09-11 ENCOUNTER — Other Ambulatory Visit (HOSPITAL_BASED_OUTPATIENT_CLINIC_OR_DEPARTMENT_OTHER): Payer: Self-pay

## 2022-09-12 ENCOUNTER — Telehealth: Payer: Commercial Managed Care - PPO | Admitting: Family Medicine

## 2022-09-12 DIAGNOSIS — W57XXXA Bitten or stung by nonvenomous insect and other nonvenomous arthropods, initial encounter: Secondary | ICD-10-CM

## 2022-09-12 DIAGNOSIS — L089 Local infection of the skin and subcutaneous tissue, unspecified: Secondary | ICD-10-CM

## 2022-09-12 MED ORDER — CEPHALEXIN 500 MG PO CAPS: 500.0000 mg | ORAL_CAPSULE | Freq: Three times a day (TID) | ORAL | 0 refills | Status: AC

## 2022-09-12 NOTE — Patient Instructions (Signed)
Insect Bite, Adult An insect bite can make your skin red, itchy, and swollen. Some insects can spread disease to people with a bite. However, most insect bites do not lead to disease, and most are not serious. What are the causes? Insects may bite for many reasons, including: Hunger. To defend themselves. Insects that bite include: Spiders. Mosquitoes. Flies. Ticks and fleas. Ants. Kissing bugs. Chiggers. What are the signs or symptoms? Symptoms often last for 2-4 days. However, itching can last up to 10 days. Symptoms include: Itching or pain in the bite area. Redness and swelling in the bite area. An open wound. In rare cases, a person may have a very bad allergic reaction (anaphylactic reaction) to a bite. Symptoms of an anaphylactic reaction may include: Feeling warm in the face (flushed). Your face may turn red. Itchy, red, swollen areas of skin (hives). Swelling of the eyes, lips, face, mouth, tongue, or throat. Trouble with breathing, talking, or swallowing. High-pitched whistling sounds, most often when breathing out (wheezing). Feeling dizzy or light-headed. Fainting. Pain or cramps in your belly (abdomen). Vomiting. Watery poop (diarrhea). How is this treated? Most insect bites are not serious. Symptoms often go away on their own. When treatment is advised, it may include: Putting ice on the bite area. Putting a cream or lotion, like calamine lotion, on the bite area. This helps with itching. Using medicines called antihistamines. You may also need: A tetanus shot if you are not up to date. An antibiotic cream or medicine. This treatment is needed if the bite area gets infected. Follow these instructions at home: Bite area care  Do not scratch the bite area. It may help to cover the bite area with a bandage or close-fitting clothing. Keep the bite area clean and dry. Check the bite area every day for signs of infection. Check for: More redness, swelling, or  pain. Fluid or blood. Warmth. Pus or a bad smell. Wash your hands often. Managing pain, itching, and swelling  You may put any of these on the bite area as told by your doctor: A paste made of baking soda and water. Cortisone cream. Calamine lotion. If told, put ice on the bite area. To do this: Put ice in a plastic bag. Place a towel between your skin and the bag. Leave the ice on for 20 minutes, 2-3 times a day. If your skin turns bright red, take off the ice right away to prevent skin damage. The risk of skin damage is higher if you cannot feel pain, heat, or cold. General instructions Apply or take over-the-counter and prescription medicines only as told by your doctor. If you were prescribed antibiotics, take or apply them as told by your doctor. Do not stop using them even if you start to feel better. How is this prevented? To help you have a lower risk of insect bites: When you are outside, wear clothes that cover your arms and legs. Use insect repellent. The best insect repellents contain one of these: DEET. Picaridin. Oil of lemon eucalyptus (OLE). IR3535. Consider spraying your clothing with a pesticide called permethrin. Permethrin helps prevent insect bites. It works for several weeks and for up to 5-6 clothing washes. Do not apply permethrin directly to the skin. If your home windows do not have screens, think about putting some in. If you will be sleeping in an area where there are mosquitoes, consider covering your sleeping area with a mosquito net. Contact a doctor if: You have redness, swelling, or pain   in the bite area. You have fluid or blood coming from the bite area. The bite area feels warm to the touch. You have pus or a bad smell coming from the bite area. You have a fever. Get help right away if: You have joint pain. You have a rash. You feel weak or more tired than you normally do. You have neck pain or a headache. You have signs of an anaphylactic  reaction. Signs may include: Swelling of your eyes, lips, face, mouth, tongue, or throat. Feeling warm in the face. Itchy, red, swollen areas of skin. Trouble with breathing, talking, or swallowing. Wheezing. Feeling dizzy or light-headed. Fainting. Pain or cramps in your belly. Vomiting or watery poop. These symptoms may be an emergency. Get help right away. Call 911. Do not wait to see if symptoms will go away. Do not drive yourself to the hospital. Summary An insect bite can make your skin red, itchy, and swollen. Treatment is usually not needed. Symptoms often go away on their own. Do not scratch the bite area. Keep it clean and dry. Use insect repellent to help prevent insect bites. Contact a doctor if you have signs of infection. This information is not intended to replace advice given to you by your health care provider. Make sure you discuss any questions you have with your health care provider. Document Revised: 06/10/2021 Document Reviewed: 06/10/2021 Elsevier Patient Education  2024 Elsevier Inc.  

## 2022-09-12 NOTE — Progress Notes (Signed)
Virtual Visit Consent   [name], you are scheduled for a virtual visit with a [facility] provider today. Just as with appointments in the office, your consent must be obtained to participate. Your consent will be active for this visit and any virtual visit you may have with one of our providers in the next 365 days. If you have a MyChart account, a copy of this consent can be sent to you electronically.  As this is a virtual visit, video technology does not allow for your provider to perform a traditional examination. This may limit your provider's ability to fully assess your condition. If your provider identifies any concerns that need to be evaluated in person or the need to arrange testing (such as labs, EKG, etc.), we will make arrangements to do so. Although advances in technology are sophisticated, we cannot ensure that it will always work on either your end or our end. If the connection with a video visit is poor, the visit may have to be switched to a telephone visit. With either a video or telephone visit, we are not always able to ensure that we have a secure connection.  By engaging in this virtual visit, you consent to the provision of healthcare and authorize for your insurance to be billed (if applicable) for the services provided during this visit. Depending on your insurance coverage, you may receive a charge related to this service.  I need to obtain your verbal consent now. Are you willing to proceed with your visit today? Robert [name] has provided verbal consent on 09/12/2022 for a virtual visit (video or telephone). Georgana Curio, FNP  Date: 09/12/2022 11:26 AM  Virtual Visit via Video Note   I, Georgana Curio, connected with  Robert [name]  ([id number], 02-14-1976) on 09/12/22 at 11:15 AM EDT by a video-enabled telemedicine application and verified that I am speaking with the correct person using two identifiers.  Location: Patient: Virtual Visit Location Patient:  Home Provider: Virtual Visit Location Provider: Home Office   I discussed the limitations of evaluation and management by telemedicine and the availability of in person appointments. The patient expressed understanding and agreed to proceed.    History of Present Illness: Robert [name] is a 47 y.o. who identifies as a male who was assigned male at birth, and is being seen today for insect bite rt outer knee with redness and now draining yellow with heat. Pt is concerned it has become infected. Marland Kitchen  HPI: HPI  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   Lumbar back pain with radiculopathy affecting right lower extremity 08/29/2016   Wart 02/27/2015   Volar plate injury of finger 01/09/2015   Depression, major 09/27/2014   Rash 09/27/2014   Erectile dysfunction 09/27/2014   Vitamin D deficiency 07/23/2014   Post-surgical hypothyroidism 06/20/2014   S/P partial thyroidectomy 02/28/2014   Anxiety state 01/11/2014   Obesity (BMI 30-39.9) 01/11/2014   Cyst of mandible 12/22/2013   ADHD (attention deficit hyperactivity disorder) 06/02/2011    Allergies:  Allergies  Allergen Reactions   Penicillins Anaphylaxis    Did it involve swelling of the face/tongue/throat, SOB, or low BP? Yes Did it  involve sudden or severe rash/hives, skin peeling, or any reaction on the inside of your mouth or nose? Yes Did you need to seek medical attention at a hospital or doctor's office? Yes When did it last happen?      99-1 years old If all above answers are "NO", may proceed with cephalosporin use.    Topamax Other (See Comments)    FACE BILATERAL NUMBNESS    Milk-Related Compounds Other (See Comments)    GI issues Pt reports that he cannot drink milk but can tolerate other dairy products   Morphine And Codeine Itching    Full body itching   Medications:  Current Outpatient Medications:    cephALEXin (KEFLEX) 500 MG capsule, Take 1 capsule (500 mg total) by mouth 3 (three) times daily for 7 days., Disp: 21 capsule, Rfl: 0   amphetamine-dextroamphetamine (ADDERALL XR) 30 MG 24 hr capsule, Take 30 mg by mouth daily. , Disp: , Rfl:    ARMOUR THYROID 120 MG tablet, Take 120 mg by mouth daily before breakfast. , Disp: , Rfl:    Ascorbic Acid (VITAMIN C) 1000 MG tablet, Take 1,000 mg by mouth daily., Disp: , Rfl:    baclofen (LIORESAL) 10 MG tablet, Take 10 mg by mouth 3 (three) times daily., Disp: , Rfl:    Cholecalciferol (VITAMIN D3 PO), Take 1 capsule by mouth daily., Disp: , Rfl:    SUMAtriptan (IMITREX) 50 MG tablet, Take 1 tablet (50 mg total) by mouth every 2 (two) hours as needed for migraine. May repeat in 2 hours if headache persists or recurs., Disp: 90 tablet, Rfl: 3   tadalafil (CIALIS) 20 MG tablet, Take 0.5-1 tablets (10-20 mg total) by mouth every other day as needed for erectile dysfunction., Disp: 5 tablet, Rfl: 2   tamsulosin (FLOMAX) 0.4 MG CAPS capsule, Take 0.4 mg by mouth daily., Disp: , Rfl:    tirzepatide (MOUNJARO) 12.5 MG/0.5ML Pen, Inject 12.5 mg into the skin once a week., Disp: 2 mL, Rfl: 2   tirzepatide (MOUNJARO) 5 MG/0.5ML Pen, Inject 7.5 mg into the skin once a week. Dr. Carlean Jews w/ [facility], Disp: , Rfl:   Observations/Objective: Patient is well-developed, well-nourished in no acute distress.  Resting comfortably  at home.  Head is normocephalic, atraumatic.  No labored breathing.  Speech is clear and coherent with logical content.  Patient is alert and oriented at baseline.    Assessment and Plan: 1. Bug bite with infection, initial encounter  Keep clean and dry, UC if sx worsen.   Follow Up  Instructions: I discussed the assessment and treatment plan with the patient. The patient was provided an opportunity to ask questions and all were answered. The patient agreed with the plan and demonstrated an understanding of the instructions.  A copy of instructions were sent to the patient via MyChart unless otherwise noted below.     The patient was advised to call back or seek an in-person evaluation if the symptoms worsen or if the condition fails to improve as anticipated.  Time:  I spent 10 minutes with the patient via telehealth technology discussing the above problems/concerns.    Georgana Curio, FNP

## 2022-09-19 ENCOUNTER — Telehealth: Payer: Commercial Managed Care - PPO | Admitting: Nurse Practitioner

## 2022-09-19 DIAGNOSIS — R399 Unspecified symptoms and signs involving the genitourinary system: Secondary | ICD-10-CM | POA: Diagnosis not present

## 2022-09-19 MED ORDER — SULFAMETHOXAZOLE-TRIMETHOPRIM 800-160 MG PO TABS: 1.0000 | ORAL_TABLET | Freq: Two times a day (BID) | ORAL | 0 refills | Status: DC

## 2022-09-19 NOTE — Patient Instructions (Signed)
Robert [name], thank you for joining Robert Pierini, FNP for today's virtual visit.  While this provider is not your primary care provider (PCP), if your PCP is located in our provider database this encounter information will be shared with them immediately following your visit.   A [facility] MyChart account gives you access to today's visit and all your visits, tests, and labs performed at [facility] " click here if you don't have a [facility] MyChart account or go to [email or web address]  Consent: (Patient) Robert [name] provided verbal consent for this virtual visit at the beginning of the encounter.  Current Medications:  Current Outpatient Medications:    sulfamethoxazole-trimethoprim (BACTRIM DS) 800-160 MG tablet, Take 1 tablet by mouth 2 (two) times daily., Disp: 20 tablet, Rfl: 0   amphetamine-dextroamphetamine (ADDERALL XR) 30 MG 24 hr capsule, Take 30 mg by mouth daily. , Disp: , Rfl:    ARMOUR THYROID 120 MG tablet, Take 120 mg by mouth daily before breakfast. , Disp: , Rfl:    Ascorbic Acid (VITAMIN C) 1000 MG tablet, Take 1,000 mg by mouth daily., Disp: , Rfl:    baclofen (LIORESAL) 10 MG tablet, Take 10 mg by mouth 3 (three) times daily., Disp: , Rfl:    cephALEXin (KEFLEX) 500 MG capsule, Take 1 capsule (500 mg total) by mouth 3 (three) times daily for 7 days., Disp: 21 capsule, Rfl: 0   Cholecalciferol (VITAMIN D3 PO), Take 1 capsule by mouth daily., Disp: , Rfl:    SUMAtriptan (IMITREX) 50 MG tablet, Take 1 tablet (50 mg total) by mouth every 2 (two) hours as needed for migraine. May repeat in 2 hours if headache persists or recurs., Disp: 90 tablet, Rfl: 3   tadalafil (CIALIS) 20 MG tablet, Take 0.5-1 tablets (10-20 mg total) by mouth every other day as needed for erectile dysfunction., Disp: 5 tablet, Rfl: 2   tamsulosin (FLOMAX) 0.4 MG CAPS capsule, Take 0.4 mg by mouth daily., Disp: , Rfl:    tirzepatide (MOUNJARO) 12.5 MG/0.5ML Pen, Inject  12.5 mg into the skin once a week., Disp: 2 mL, Rfl: 2   tirzepatide (MOUNJARO) 5 MG/0.5ML Pen, Inject 7.5 mg into the skin once a week. Dr. Carlean Jews w/ [facility], Disp: , Rfl:    Medications ordered in this encounter:  Meds ordered this encounter  Medications   sulfamethoxazole-trimethoprim (BACTRIM DS) 800-160 MG tablet    Sig: Take 1 tablet by mouth 2 (two) times daily.    Dispense:  20 tablet    Refill:  0    Order Specific Question:   Supervising Provider    Answer:   Merrilee Jansky [id number]     *If you need refills on other medications prior to your next appointment, please contact your pharmacy*  Follow-Up: Call back or seek an in-person evaluation if the symptoms worsen or if the condition fails to improve as anticipated.  [facility] [phone]  Other Instructions Take medication as prescribe Cotton underwear Take shower not bath Cranberry juice, yogurt Force fluids AZO over the counter X2 days RTO prn    If you have been instructed to have an in-person evaluation today at a local Urgent Care facility, please use the link below. It will take you to a list of all of our available  Urgent Cares, including address, phone number and hours of operation. Please do not delay care.   Urgent Cares  If you or a family member do not  have a primary care provider, use the link below to schedule a visit and establish care. When you choose a [facility] primary care physician or advanced practice provider, you gain a long-term partner in health. Find a Primary Care Provider  Learn more about [facility] in-office and virtual care options: [facility] - Get Care Now

## 2022-09-19 NOTE — Progress Notes (Signed)
Virtual Visit Consent   Robert [name], you are scheduled for a virtual visit with Mary-Margaret Daphine Deutscher, FNP, a [facility] provider, today.     Just as with appointments in the office, your consent must be obtained to participate.  Your consent will be active for this visit and any virtual visit you may have with one of our providers in the next 365 days.     If you have a MyChart account, a copy of this consent can be sent to you electronically.  All virtual visits are billed to your insurance company just like a traditional visit in the office.    As this is a virtual visit, video technology does not allow for your provider to perform a traditional examination.  This may limit your provider's ability to fully assess your condition.  If your provider identifies any concerns that need to be evaluated in person or the need to arrange testing (such as labs, EKG, etc.), we will make arrangements to do so.     Although advances in technology are sophisticated, we cannot ensure that it will always work on either your end or our end.  If the connection with a video visit is poor, the visit may have to be switched to a telephone visit.  With either a video or telephone visit, we are not always able to ensure that we have a secure connection.     I need to obtain your verbal consent now.   Are you willing to proceed with your visit today? YES   Robert [name] has provided verbal consent on 09/19/2022 for a virtual visit (video or telephone).   Mary-Margaret Daphine Deutscher, FNP   Date: 09/19/2022 7:00 PM   Virtual Visit via Video Note   I, Mary-Margaret Daphine Deutscher, connected with Robert [name] ([id number], 1975-12-02) on 09/19/22 at  7:00 PM EDT by a video-enabled telemedicine application and verified that I am speaking with the correct person using two identifiers.  Location: Patient: Virtual Visit Location Patient: Home Provider: Virtual Visit Location Provider: Mobile   I discussed the  limitations of evaluation and management by telemedicine and the availability of in person appointments. The patient expressed understanding and agreed to proceed.    History of Present Illness: Robert [name] is a 47 y.o. who identifies as a male who was assigned male at birth, and is being seen today for uti.  HPI: Dysuria  This is a new problem. The current episode started in the past 7 days. The problem occurs every urination. The problem has been waxing and waning. The quality of the pain is described as burning. The pain is at a severity of 7/10. The pain is moderate. There has been no fever. He is Sexually active. There is No history of pyelonephritis. Associated symptoms include a discharge and hesitancy. Pertinent negatives include no flank pain, frequency or urgency. He has tried nothing for the symptoms. The treatment provided mild relief.    Review of Systems  Genitourinary:  Positive for dysuria and hesitancy. Negative for flank pain, frequency and urgency.    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   Lumbar back pain with radiculopathy affecting right lower  extremity 08/29/2016   Wart 02/27/2015   Volar plate injury of finger 01/09/2015   Depression, major 09/27/2014   Rash 09/27/2014   Erectile dysfunction 09/27/2014   Vitamin D deficiency 07/23/2014   Post-surgical hypothyroidism 06/20/2014   S/P partial thyroidectomy 02/28/2014   Anxiety state 01/11/2014   Obesity (BMI 30-39.9) 01/11/2014   Cyst of mandible 12/22/2013   ADHD (attention deficit hyperactivity disorder) 06/02/2011    Allergies:  Allergies  Allergen Reactions    Penicillins Anaphylaxis    Did it involve swelling of the face/tongue/throat, SOB, or low BP? Yes Did it involve sudden or severe rash/hives, skin peeling, or any reaction on the inside of your mouth or nose? Yes Did you need to seek medical attention at a hospital or doctor's office? Yes When did it last happen?      3-42 years old If all above answers are "NO", may proceed with cephalosporin use.    Topamax Other (See Comments)    FACE BILATERAL NUMBNESS   Milk-Related Compounds Other (See Comments)    GI issues Pt reports that he cannot drink milk but can tolerate other dairy products   Morphine And Codeine Itching    Full body itching   Medications:  Current Outpatient Medications:    amphetamine-dextroamphetamine (ADDERALL XR) 30 MG 24 hr capsule, Take 30 mg by mouth daily. , Disp: , Rfl:    ARMOUR THYROID 120 MG tablet, Take 120 mg by mouth daily before breakfast. , Disp: , Rfl:    Ascorbic Acid (VITAMIN C) 1000 MG tablet, Take 1,000 mg by mouth daily., Disp: , Rfl:    baclofen (LIORESAL) 10 MG tablet, Take 10 mg by mouth 3 (three) times daily., Disp: , Rfl:    cephALEXin (KEFLEX) 500 MG capsule, Take 1 capsule (500 mg total) by mouth 3 (three) times daily for 7 days., Disp: 21 capsule, Rfl: 0   Cholecalciferol (VITAMIN D3 PO), Take 1 capsule by mouth daily., Disp: , Rfl:    SUMAtriptan (IMITREX) 50 MG tablet, Take 1 tablet (50 mg total) by mouth every 2 (two) hours as needed for migraine. May repeat in 2 hours if headache persists or recurs., Disp: 90 tablet, Rfl: 3   tadalafil (CIALIS) 20 MG tablet, Take 0.5-1 tablets (10-20 mg total) by mouth every other day as needed for erectile dysfunction., Disp: 5 tablet, Rfl: 2   tamsulosin (FLOMAX) 0.4 MG CAPS capsule, Take 0.4 mg by mouth daily., Disp: , Rfl:    tirzepatide (MOUNJARO) 12.5 MG/0.5ML Pen, Inject 12.5 mg into the skin once a week., Disp: 2 mL, Rfl: 2   tirzepatide (MOUNJARO) 5 MG/0.5ML Pen, Inject 7.5 mg into the skin once a  week. Dr. Carlean Jews w/ [facility], Disp: , Rfl:   Observations/Objective: Patient is well-developed, well-nourished in no acute distress.  Resting comfortably  at home.  Head is normocephalic, atraumatic.  No labored breathing.  Speech is clear and coherent with logical content.  Patient is alert and oriented at baseline.  No back pain  Assessment and Plan:  Robert [name] in today with chief complaint of Dysuria   1. UTI symptoms Take medication as prescribe Cotton underwear Take shower not bath Cranberry juice, yogurt Force fluids AZO over the counter X2 days RTO prn- if mot getting better by tomorrow evening  Meds ordered this encounter  Medications   sulfamethoxazole-trimethoprim (BACTRIM DS) 800-160 MG tablet    Sig: Take 1 tablet by mouth 2 (two) times daily.    Dispense:  20  tablet    Refill:  0    Order Specific Question:   Supervising Provider    Answer:   Merrilee Jansky [[id number]]       Follow Up Instructions: I discussed the assessment and treatment plan with the patient. The patient was provided an opportunity to ask questions and all were answered. The patient agreed with the plan and demonstrated an understanding of the instructions.  A copy of instructions were sent to the patient via MyChart.  The patient was advised to call back or seek an in-person evaluation if the symptoms worsen or if the condition fails to improve as anticipated.  Time:  I spent 6 minutes with the patient via telehealth technology discussing the above problems/concerns.    Mary-Margaret Daphine Deutscher, FNP

## 2023-01-13 ENCOUNTER — Telehealth: Payer: Commercial Managed Care - PPO | Admitting: Family Medicine

## 2023-01-13 DIAGNOSIS — B353 Tinea pedis: Secondary | ICD-10-CM | POA: Diagnosis not present

## 2023-01-13 MED ORDER — CLOTRIMAZOLE-BETAMETHASONE 1-0.05 % EX CREA: 1.0000 | TOPICAL_CREAM | Freq: Two times a day (BID) | CUTANEOUS | 1 refills | Status: DC

## 2023-01-13 NOTE — Progress Notes (Signed)
E-Visit for Athlete's Foot  We are sorry that you are not feeling well. Here is how we plan to help!  Based on what you shared with me it looks like you have tinea pedis, or "Athlete's Foot".  This type of rash can spread through shared towels, clothing, bedding, etc., as well as hard surfaces (particularly in moist areas) such as shower stalls, locker room floors, pool areas, etc. The symptoms of Athlete's Foot include red, swollen, peeling, itchy skin between the toes (especially between the pinky toe and the one next to it). The sole and heel of the foot may also be affected. In severe cases, the skin on the feet can blister.  Athlete's foot can usually be treated with over-the-counter topical antifungal products; but sometimes with chronic or extensive tinea pedis, prescription oral medications are needed.   I am recommending: Lotrisone Cream- I have ordered this for you.     HOME CARE:  Keep feet clean, dry, and cool. Avoid using swimming pools, public showers, or foot baths. Wear sandals when possible or air shoes out by alternating them every 2-3 days. Avoid wearing closed shoes and wearing socks made from fabric that doesn't dry easily (for example, nylon). Treat the infection with recommended medication  GET HELP RIGHT AWAY IF:  Symptoms that don't go away after treatment. Severe itching that persists. If your rash spreads or swells. If your rash begins to have drainage or smell. You develop a fever.  MAKE SURE YOU   Understand these instructions. Will watch your condition. Will get help right away if you are not doing well or get worse.   Thank you for choosing an e-visit.  Your e-visit answers were reviewed by a board certified advanced clinical practitioner to complete your personal care plan. Depending upon the condition, your plan could have included both over the counter or prescription medications.  Please review your pharmacy choice. Make sure the  pharmacy is open so you can pick up prescription now. If there is a problem, you may contact your provider through [employer] and have the prescription routed to another pharmacy.  Your safety is important to [place]. If you have drug allergies check your prescription carefully.   For the next 24 hours you can use MyChart to ask questions about today's visit, request a non-urgent call back, or ask for a work or school excuse.  You will get an email in the next two days asking about your experience. I hope that your e-visit has been valuable and will speed your recovery  References or for more information:  FatMenus.com.[email or web address]?search=[email or web address]%[email or web address]&source=search_result&selectedTitle=1~104&usage_type=default&display_rank=1  [email or web address]        I provided 5 minutes of non face-to-face time during this encounter for chart review, medication and order placement, as well as and documentation.

## 2023-01-27 ENCOUNTER — Other Ambulatory Visit (HOSPITAL_BASED_OUTPATIENT_CLINIC_OR_DEPARTMENT_OTHER): Payer: Self-pay

## 2023-02-15 ENCOUNTER — Encounter: Payer: Self-pay | Admitting: Family Medicine

## 2023-06-22 ENCOUNTER — Encounter: Payer: Commercial Managed Care - PPO | Admitting: Family Medicine

## 2023-07-01 ENCOUNTER — Encounter: Payer: Self-pay | Admitting: Family Medicine

## 2023-07-01 ENCOUNTER — Telehealth: Admitting: Family Medicine

## 2023-07-01 VITALS — Ht 75.0 in | Wt 283.0 lb

## 2023-07-01 DIAGNOSIS — B353 Tinea pedis: Secondary | ICD-10-CM

## 2023-07-01 MED ORDER — KETOCONAZOLE 2 % EX CREA: 1.0000 | TOPICAL_CREAM | Freq: Every day | CUTANEOUS | 2 refills | Status: DC

## 2023-07-01 MED ORDER — KETOCONAZOLE 2 % EX CREA: 1.0000 | TOPICAL_CREAM | Freq: Two times a day (BID) | CUTANEOUS | 2 refills | Status: AC

## 2023-07-01 NOTE — Progress Notes (Signed)
 Phone [phone] Virtual visit via Video note   Subjective:  Chief complaint: Chief Complaint  Patient presents with   atheletes foot    Pt c/o atheles feet that has been going on for a while.     Our team/I connected with [name] at  2:00 PM EDT by a video enabled telemedicine application (caregility through epic) and verified that I am speaking with the correct person using two identifiers. Our team/I discussed the limitations of evaluation and management by telemedicine and the availability of in person appointments.No physical exam was performed (except for noted visual exam or audio findings with Telehealth visits).   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    Priority: High   Lumbar back pain with radiculopathy affecting right lower extremity 08/29/2016    Priority: High   Hyperlipidemia, unspecified 10/22/2017    Priority: Medium    Depression, major 09/27/2014    Priority: Medium    Vitamin D deficiency 07/23/2014    Priority: Medium    Post-surgical hypothyroidism 06/20/2014    Priority: Medium    Anxiety state 01/11/2014    Priority: Medium    Obesity (BMI 30-39.9) 01/11/2014    Priority: Medium    ADHD (attention deficit hyperactivity disorder) 06/02/2011    Priority: Medium    Major depressive disorder with single episode, in full remission (HCC) 06/07/2019    Priority: Low   Cervical radiculopathy at C6 08/01/2018    Priority: Low   Radial nerve entrapment, right 07/04/2018    Priority: Low   Wart 02/27/2015    Priority: Low   Volar plate injury of finger 01/09/2015    Priority: Low   Rash 09/27/2014    Priority: Low   Erectile dysfunction 09/27/2014     Priority: Low   S/P partial thyroidectomy 02/28/2014    Priority: Low   Cyst of mandible 12/22/2013    Priority: Low    Medications- reviewed and updated Current Outpatient Medications  Medication Sig Dispense Refill   amphetamine-dextroamphetamine (ADDERALL XR) 30 MG 24 hr capsule Take 30 mg by mouth daily.      ARMOUR THYROID 120 MG tablet Take 120 mg by mouth daily before breakfast.      Ascorbic Acid (VITAMIN C) 1000 MG tablet Take 1,000 mg by mouth daily.     Cholecalciferol (VITAMIN D3 PO) Take 1 capsule by mouth daily.     ketoconazole (NIZORAL) 2 % cream Apply 1 Application topically 2 (two) times daily. Athletes foot treatment for minimum 2 weeks 60 g 2   SUMAtriptan (IMITREX) 50 MG tablet Take 1 tablet (50 mg total) by mouth every 2 (two) hours as needed for migraine. May repeat in 2 hours if headache persists or recurs. 90 tablet 3   tadalafil (CIALIS) 20 MG tablet Take 0.5-1 tablets (10-20 mg total) by mouth every other day as needed for erectile dysfunction. 5 tablet 2   tamsulosin (FLOMAX) 0.4 MG CAPS capsule Take 0.4 mg by mouth daily.     tirzepatide (MOUNJARO) 12.5 MG/0.5ML Pen Inject 12.5 mg into the skin once a week. 2 mL 2   No current facility-administered medications for this visit.     Objective:  Ht 6'  3" (1.905 m)   Wt 283 lb (128.4 kg)   BMI 35.37 kg/m  self reported vitals Gen: NAD, resting comfortably Lungs: nonlabored, normal respiratory rate  Skin: warm, dry, some erythema and scaling on bottom of foot-patient reports similar on the other side    Assessment and Plan   # Concern for athlete's foot/tinea pedis S: Patient reports on and off for years. Silver sulfonamide that he has used in the past and has used Lotrisone (combo in past) that didn't work as well. Can be very itchy. Has tried lotrimin AF in past. Denies sweaty feet in general.  -He reflects back in a long as he has done any treatment is about a week A/P: This appears to be tinea  pedis bilaterally.  Has failed topical treatments but only used for up to a week-recommended using ketoconazole twice daily for 2 weeks -Terbinafine 250 mg daily for 2 weeks if fails to respond (prefers to hold off on coming in for KOH prep) -If that did not work we will refer to dermatology for their expert opinion -Discussed preventative measures but he feels he is doing most of these  Recommended follow up: No follow-ups on file. Future Appointments  Date Time Provider Department Center  07/29/2023  1:00 PM Shelva Majestic, MD [facility]    Lab/Order associations:   ICD-10-CM   1. Tinea pedis of both feet  B35.3       Meds ordered this encounter  Medications   DISCONTD: ketoconazole (NIZORAL) 2 % cream    Sig: Apply 1 Application topically daily. Athletes foot treatment    Dispense:  60 g    Refill:  2   ketoconazole (NIZORAL) 2 % cream    Sig: Apply 1 Application topically 2 (two) times daily. Athletes foot treatment for minimum 2 weeks    Dispense:  60 g    Refill:  2    Return precautions advised.  Tana Conch, MD

## 2023-07-29 ENCOUNTER — Ambulatory Visit (INDEPENDENT_AMBULATORY_CARE_PROVIDER_SITE_OTHER): Admitting: Family Medicine

## 2023-07-29 ENCOUNTER — Encounter: Payer: Self-pay | Admitting: Family Medicine

## 2023-07-29 VITALS — BP 136/82 | HR 72 | Temp 97.0°F | Ht 75.0 in | Wt 292.6 lb

## 2023-07-29 DIAGNOSIS — E559 Vitamin D deficiency, unspecified: Secondary | ICD-10-CM | POA: Diagnosis not present

## 2023-07-29 DIAGNOSIS — Z125 Encounter for screening for malignant neoplasm of prostate: Secondary | ICD-10-CM | POA: Diagnosis not present

## 2023-07-29 DIAGNOSIS — E669 Obesity, unspecified: Secondary | ICD-10-CM | POA: Diagnosis not present

## 2023-07-29 DIAGNOSIS — E890 Postprocedural hypothyroidism: Secondary | ICD-10-CM

## 2023-07-29 DIAGNOSIS — Z131 Encounter for screening for diabetes mellitus: Secondary | ICD-10-CM

## 2023-07-29 DIAGNOSIS — Z23 Encounter for immunization: Secondary | ICD-10-CM

## 2023-07-29 DIAGNOSIS — Z0000 Encounter for general adult medical examination without abnormal findings: Secondary | ICD-10-CM | POA: Diagnosis not present

## 2023-07-29 DIAGNOSIS — E785 Hyperlipidemia, unspecified: Secondary | ICD-10-CM | POA: Diagnosis not present

## 2023-07-29 DIAGNOSIS — Z1211 Encounter for screening for malignant neoplasm of colon: Secondary | ICD-10-CM

## 2023-07-29 LAB — COMPREHENSIVE METABOLIC PANEL WITH GFR
ALT: 28 U/L (ref 0–53)
AST: 22 U/L (ref 0–37)
Albumin: 4.5 g/dL (ref 3.5–5.2)
Alkaline Phosphatase: 62 U/L (ref 39–117)
BUN: 17 mg/dL (ref 6–23)
CO2: 25 meq/L (ref 19–32)
Calcium: 9.1 mg/dL (ref 8.4–10.5)
Chloride: 104 meq/L (ref 96–112)
Creatinine, Ser: 0.96 mg/dL (ref 0.40–1.50)
GFR: 95.47 mL/min (ref >60.00)
Glucose, Bld: 85 mg/dL (ref 70–99)
Potassium: 4.1 meq/L (ref 3.5–5.1)
Sodium: 136 meq/L (ref 135–145)
Total Bilirubin: 0.6 mg/dL (ref 0.2–1.2)
Total Protein: 6.5 g/dL (ref 6.0–8.3)

## 2023-07-29 LAB — CBC WITH DIFFERENTIAL/PLATELET
Basophils Absolute: 0.1 10*3/uL (ref 0.0–0.1)
Basophils Relative: 0.7 % (ref 0.0–3.0)
Eosinophils Absolute: 0.1 10*3/uL (ref 0.0–0.7)
Eosinophils Relative: 1.1 % (ref 0.0–5.0)
HCT: 48.7 % (ref 39.0–52.0)
Hemoglobin: 16.5 g/dL (ref 13.0–17.0)
Lymphocytes Relative: 35.4 % (ref 12.0–46.0)
Lymphs Abs: 2.8 10*3/uL (ref 0.7–4.0)
MCHC: 33.9 g/dL (ref 30.0–36.0)
MCV: 89.6 fl (ref 78.0–100.0)
Monocytes Absolute: 0.8 10*3/uL (ref 0.1–1.0)
Monocytes Relative: 10.5 % (ref 3.0–12.0)
Neutro Abs: 4.2 10*3/uL (ref 1.4–7.7)
Neutrophils Relative %: 52.3 % (ref 43.0–77.0)
Platelets: 340 10*3/uL (ref 150.0–400.0)
RBC: 5.43 Mil/uL (ref 4.22–5.81)
RDW: 13.8 % (ref 11.5–15.5)
WBC: 8 10*3/uL (ref 4.0–10.5)

## 2023-07-29 LAB — HEMOGLOBIN A1C: Hgb A1c MFr Bld: 5.2 % (ref 4.6–6.5)

## 2023-07-29 LAB — LIPID PANEL
Cholesterol: 170 mg/dL (ref 0–200)
HDL: 38.6 mg/dL — ABNORMAL LOW (ref >39.00)
LDL Cholesterol: 114 mg/dL — ABNORMAL HIGH (ref 0–99)
NonHDL: 131.23
Total CHOL/HDL Ratio: 4
Triglycerides: 88 mg/dL (ref 0.0–149.0)
VLDL: 17.6 mg/dL (ref 0.0–40.0)

## 2023-07-29 LAB — T3, FREE: T3, Free: 4.7 pg/mL — ABNORMAL HIGH (ref 2.3–4.2)

## 2023-07-29 LAB — PSA: PSA: 1.29 ng/mL (ref 0.10–4.00)

## 2023-07-29 LAB — TSH: TSH: 0.24 u[IU]/mL — ABNORMAL LOW (ref 0.35–5.50)

## 2023-07-29 LAB — T4, FREE: Free T4: 0.68 ng/dL (ref 0.60–1.60)

## 2023-07-29 LAB — VITAMIN D 25 HYDROXY (VIT D DEFICIENCY, FRACTURES): VITD: 77.8 ng/mL (ref 30.00–100.00)

## 2023-07-29 NOTE — Patient Instructions (Addendum)
 please let us  know if you have not received your Cologuard within 3 weeks-please complete after you receive  Tetanus, Diphtheria, and Pertussis (Tdap) in right arm   Please stop by lab before you go If you have mychart- we will send your results within 3 business days of us  receiving them.  If you do not have mychart- we will call you about results within 5 business days of us  receiving them.  *please also note that you will see labs on mychart as soon as they post. I will later go in and write notes on them- will say "notes from Dr. Arlene Ben"    Recommended follow up: Return in about 1 year (around 07/28/2024) for physical or sooner if needed.Schedule b4 you leave.

## 2023-07-29 NOTE — Addendum Note (Signed)
 Addended by: Arva Lathe on: 07/29/2023 01:32 PM   Modules accepted: Orders

## 2023-07-29 NOTE — Addendum Note (Signed)
 Addended by: Almira Jaeger on: 07/29/2023 01:25 PM   Modules accepted: Level of Service

## 2023-07-29 NOTE — Progress Notes (Signed)
 Phone: [phone]   Subjective:  Patient presents today for their annual physical. Chief complaint-noted.   See problem oriented charting- ROS- full  review of systems was completed and negative  Per full ROS sheet completed by patient  The following were reviewed and entered/updated in epic: Past Medical History:  Diagnosis Date   ADD (attention deficit disorder) without hyperactivity    according to his mother/ work test possible   Anxiety    Chronic sinusitis    Depression    Headache    History of kidney stones    passed stone   Hyperlipidemia    no meds, diet controlled   Hypertension    recently dx 2 months ago, MD just monitoring, no meds at this time   Hypothyroidism    Sleep apnea    Uses CPAP nightly.   Patient Active Problem List   Diagnosis Date Noted   Low back pain 06/02/2019    Priority: High   Spondylolysis, lumbar region 04/21/2019    Priority: High   Secondary polycythemia 09/09/2017    Priority: High   Lumbar back pain with radiculopathy affecting right lower extremity 08/29/2016    Priority: High   Hyperlipidemia, unspecified 10/22/2017    Priority: Medium    Depression, major 09/27/2014    Priority: Medium    Vitamin D  deficiency 07/23/2014    Priority: Medium    Post-surgical hypothyroidism 06/20/2014    Priority: Medium    Anxiety state 01/11/2014    Priority: Medium    Obesity (BMI 30-39.9) 01/11/2014    Priority: Medium    ADHD (attention deficit hyperactivity disorder) 06/02/2011    Priority: Medium    Major depressive disorder with single episode, in full remission (HCC) 06/07/2019    Priority: Low   Cervical radiculopathy at C6 08/01/2018    Priority: Low   Radial nerve entrapment, right 07/04/2018    Priority: Low   Wart 02/27/2015    Priority: Low   Volar plate injury of finger 01/09/2015    Priority: Low   Rash 09/27/2014    Priority: Low   Erectile dysfunction 09/27/2014    Priority: Low   S/P partial thyroidectomy  02/28/2014    Priority: Low   Cyst of mandible 12/22/2013    Priority: Low   Past Surgical History:  Procedure Laterality Date   HERNIA REPAIR     pt. was an infant when this surgery occured   NASAL SINUS SURGERY     THYROID  SURGERY Right 02/28/2014   DR TEOH    PARTIAL THYROIDECTOMY   THYROIDECTOMY Right 02/28/2014   Procedure: RIGHT HEMI THYROIDECTOMY;  Surgeon: Lawence Press, MD;  Location: [facility] OR;  Service: ENT;  Laterality: Right;   WISDOM TOOTH EXTRACTION      Family History  Problem Relation Age of Onset   Hyperlipidemia Mother    Hypertension Mother    Early death Father        multiple system organ failure   Liver disease Father        around 2016. cirrhosis from Hep C through blood transfusion   Pancreatitis Father        led to multiple system organ failure   Diabetes Maternal Grandmother    Obesity Maternal Grandmother    Arthritis Paternal Grandmother    Cancer Paternal Grandmother    Diabetes Paternal Grandmother    Thyroid  disease Neg Hx     Medications- reviewed and updated Current Outpatient Medications  Medication Sig Dispense Refill  amphetamine -dextroamphetamine (ADDERALL XR) 30 MG 24 hr capsule Take 30 mg by mouth daily.      ARMOUR THYROID  120 MG tablet Take 120 mg by mouth daily before breakfast.      Ascorbic Acid  (VITAMIN C) 1000 MG tablet Take 1,000 mg by mouth daily.     Cholecalciferol  (VITAMIN D3 PO) Take 1 capsule by mouth daily.     ketoconazole  (NIZORAL ) 2 % cream Apply 1 Application topically 2 (two) times daily. Athletes foot treatment for minimum 2 weeks 60 g 2   SUMAtriptan  (IMITREX ) 50 MG tablet Take 1 tablet (50 mg total) by mouth every 2 (two) hours as needed for migraine. May repeat in 2 hours if headache persists or recurs. 90 tablet 3   tadalafil  (CIALIS ) 20 MG tablet Take 0.5-1 tablets (10-20 mg total) by mouth every other day as needed for erectile dysfunction. 5 tablet 2   tamsulosin  (FLOMAX ) 0.4 MG CAPS capsule Take 0.4 mg by  mouth daily.     tirzepatide  (MOUNJARO ) 12.5 MG/0.5ML Pen Inject 12.5 mg into the skin once a week. 2 mL 2   No current facility-administered medications for this visit.    Allergies-reviewed and updated Allergies  Allergen Reactions   Penicillins Anaphylaxis    Did it involve swelling of the face/tongue/throat, SOB, or low BP? Yes Did it involve sudden or severe rash/hives, skin peeling, or any reaction on the inside of your mouth or nose? Yes Did you need to seek medical attention at a hospital or doctor's office? Yes When did it last happen?      20-44 years old If all above answers are "NO", may proceed with cephalosporin use.    Topamax  Other (See Comments)    FACE BILATERAL NUMBNESS   Milk-Related Compounds Other (See Comments)    GI issues Pt reports that he cannot drink milk but can tolerate other dairy products   Morphine  And Codeine  Itching    Full body itching    Social History   Social History Narrative   Married 2016. 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      HH of 1 pets dog and cats.   Objective  Objective:  BP 136/82   Pulse 72   Temp (!) 97 F (36.1 C)   Ht 6\' 3"  (1.905 m)   Wt 292 lb 9.6 oz (132.7 kg)   SpO2 96%   BMI 36.57 kg/m  Gen: NAD, resting comfortably HEENT: Mucous membranes are moist. Oropharynx normal Neck: no thyromegaly CV: RRR no murmurs rubs or gallops Lungs: CTAB no crackles, wheeze, rhonchi Abdomen: soft/nontender/nondistended/normal bowel sounds. No rebound or guarding.  Ext: no edema Skin: warm, dry Neuro: grossly normal, moves all extremities, PERRLA    Assessment and Plan  48 y.o. male presenting for annual physical.  Health Maintenance counseling: 1. Anticipatory guidance: Patient counseled regarding regular dental exams -q6 months, eye exams -yearly,  avoiding smoking and second hand smoke , limiting alcohol  to 2 beverages per day - minimal intake socially- may be weeks or months without, no illicit drugs .   2. Risk factor reduction:  Advised patient of need for regular exercise and diet rich and fruits and vegetables to reduce risk of heart attack and stroke.  Exercise- busy in the yard this week installing irrigation system- struggling otherwise for consistency.  Diet/weight management-peak weight 315 prior to Mounjaro  now at  15 mg (having to do through compound pharmacy as insurance not covering and this has not been as effective) -has had significant weight loss but up about 10 pounds in the last year.  Peak weight loss was 36 pounds. Food noise has worsened and feels less full- he's hoping eventually will get covered again. Has tried to reduce carbohydrates.  -weight as low as 270 on real version of mounjaro  Wt Readings from Last 3 Encounters:  07/29/23 292 lb 9.6 oz (132.7 kg)  07/01/23 283 lb (128.4 kg)  06/18/22 282 lb 6.4 oz (128.1 kg)  3. Immunizations/screenings/ancillary studies-offered to update Tdap today-opts out of further COVID-19 vaccinations Immunization History  Administered Date(s) Administered   Td 07/01/2001   Tdap 12/12/2012  4. Prostate cancer screening-  no family history, start at age 89 traditionally but on testosterone  so we will monitor 5. Colon cancer screening - discussed option of referring to GI today for colonoscopy.  No family history of colon cancer or colon polyps and also discussed Cologuard  6. Skin cancer screening-saw dermatology about 2 years ago. advised regular sunscreen use. Denies worrisome, changing, or new skin lesions.  7. Smoking associated screening (lung cancer screening, AAA screen 65-75, UA)-never smoker-  8. STD screening -only active with wife  Status of chronic or acute concerns   #social update- mother in law with cancer- given 3-4 years to live -wife still doing well in real estate  # Tinea pedis-on and off issues for years-has failed  topical treatments in the past shorter-term but we recommended a longer course for at least 2 weeks as of 07/01/2023 visit and discussed terbinafine to 50 mg daily for 2 weeks if failed to respond with referral to dermatology if that failed to improve the issue -resolved on this- he will rech out if recurs and needs refill but has some left.   # ADD-managed by psychiatry on Adderall  -Depression in the past but no recent issues- primarily situational   # Hypothyroidism-managed byon Armour Thyroid  120 mg -check TSH, t3, t4 for him today   # Migraines-sumatriptan  50 mg helpful when needed- no recent issues   # Erectile dysfunction-tadalafil  as needed-fortunately no lightheadedness despite taking Flomax  daily through urology  # Cervical disc surgery in 2022 as well as low back surgery in 2021-these were with Dr. Nigel Bart and still has some back pain issues but neck has been okay   #hyperlipidemia S: Medication: None The 10-year ASCVD risk score (Arnett DK, et al., 2019) is: 2.4%  Lab Results  Component Value Date   CHOL 197 06/18/2022   HDL 46.60 06/18/2022   LDLCALC 134 (H) 06/18/2022   LDLDIRECT 158.1 07/04/2012   TRIG 81.0 06/18/2022   CHOLHDL 4 06/18/2022   A/P: 10 year risk of heart attack or stroke only 2.4% based on last years labs- udpate today- would not recommend medicine in this range  # Prior low testosterone - started on injection through [facility]- has felt well on this- just started less than a month ago    #Vitamin D  deficiency S: Medication: 5000 international units daily- may do 10000 in winter Last vitamin D  Lab Results  Component Value Date   VD25OH 85.00 06/18/2022   A/P: has done well lately- check today- prefer level <100 at least- was fine last year     Recommended follow up: Return in about 1 year (around 07/28/2024) for physical or sooner if needed.Schedule b4 you leave.  Lab/Order associations:NOT fasting- last food 4.5 hours ago  ICD-10-CM    1. Preventative health care  Z00.00     2. Screen for colon cancer  Z12.11     3. Screening for diabetes mellitus  Z13.1     4. Obesity (BMI 30-39.9)  E66.9     5. Hyperlipidemia, unspecified hyperlipidemia type  E78.5     6. Vitamin D  deficiency  E55.9     7. Post-surgical hypothyroidism  E89.0     8. Screening for prostate cancer  Z12.5      No orders of the defined types were placed in this encounter.  Return precautions advised.  Clarisa Crooked, MD

## 2023-08-10 LAB — COLOGUARD: COLOGUARD: NEGATIVE

## 2023-08-11 ENCOUNTER — Ambulatory Visit: Payer: Self-pay | Admitting: Family Medicine

## 2023-08-24 ENCOUNTER — Telehealth: Admitting: Family Medicine

## 2023-08-24 DIAGNOSIS — L539 Erythematous condition, unspecified: Secondary | ICD-10-CM | POA: Diagnosis not present

## 2023-08-24 DIAGNOSIS — T63461A Toxic effect of venom of wasps, accidental (unintentional), initial encounter: Secondary | ICD-10-CM | POA: Diagnosis not present

## 2023-08-24 DIAGNOSIS — L0390 Cellulitis, unspecified: Secondary | ICD-10-CM

## 2023-08-24 MED ORDER — SULFAMETHOXAZOLE-TRIMETHOPRIM 800-160 MG PO TABS: 1.0000 | ORAL_TABLET | Freq: Two times a day (BID) | ORAL | 0 refills | Status: AC

## 2023-08-24 MED ORDER — TRIAMCINOLONE ACETONIDE 0.1 % EX CREA: 1.0000 | TOPICAL_CREAM | Freq: Two times a day (BID) | CUTANEOUS | 0 refills | Status: AC

## 2023-08-24 NOTE — Patient Instructions (Signed)
 Georjean Kite, thank you for joining Lanetta Pion, NP for today's virtual visit.  While this provider is not your primary care provider (PCP), if your PCP is located in our provider database this encounter information will be shared with them immediately following your visit.   A [facility] MyChart account gives you access to today's visit and all your visits, tests, and labs performed at [facility] " click here if you don't have a [facility] MyChart account or go to [email or web address]  Consent: (Patient) Georjean Kite provided verbal consent for this virtual visit at the beginning of the encounter.  Current Medications:  Current Outpatient Medications:    sulfamethoxazole -trimethoprim  (BACTRIM  DS) 800-160 MG tablet, Take 1 tablet by mouth 2 (two) times daily for 7 days., Disp: 14 tablet, Rfl: 0   triamcinolone  cream (KENALOG ) 0.1 %, Apply 1 Application topically 2 (two) times daily., Disp: 30 g, Rfl: 0   amphetamine -dextroamphetamine (ADDERALL XR) 30 MG 24 hr capsule, Take 30 mg by mouth daily. , Disp: , Rfl:    ARMOUR THYROID  120 MG tablet, Take 120 mg by mouth daily before breakfast. , Disp: , Rfl:    Ascorbic Acid  (VITAMIN C) 1000 MG tablet, Take 1,000 mg by mouth daily., Disp: , Rfl:    Cholecalciferol  (VITAMIN D3 PO), Take 1 capsule by mouth daily., Disp: , Rfl:    ketoconazole  (NIZORAL ) 2 % cream, Apply 1 Application topically 2 (two) times daily. Athletes foot treatment for minimum 2 weeks, Disp: 60 g, Rfl: 2   SUMAtriptan  (IMITREX ) 50 MG tablet, Take 1 tablet (50 mg total) by mouth every 2 (two) hours as needed for migraine. May repeat in 2 hours if headache persists or recurs., Disp: 90 tablet, Rfl: 3   tadalafil  (CIALIS ) 20 MG tablet, Take 0.5-1 tablets (10-20 mg total) by mouth every other day as needed for erectile dysfunction., Disp: 5 tablet, Rfl: 2   tamsulosin  (FLOMAX ) 0.4 MG CAPS capsule, Take 0.4 mg by mouth daily., Disp: , Rfl:    tirzepatide   (MOUNJARO ) 12.5 MG/0.5ML Pen, Inject 12.5 mg into the skin once a week., Disp: 2 mL, Rfl: 2   Medications ordered in this encounter:  Meds ordered this encounter  Medications   sulfamethoxazole -trimethoprim  (BACTRIM  DS) 800-160 MG tablet    Sig: Take 1 tablet by mouth 2 (two) times daily for 7 days.    Dispense:  14 tablet    Refill:  0    Supervising Provider:   LAMPTEY, PHILIP O [[id number]]   triamcinolone  cream (KENALOG ) 0.1 %    Sig: Apply 1 Application topically 2 (two) times daily.    Dispense:  30 g    Refill:  0    Supervising Provider:   Corine Dice [[id number]]     *If you need refills on other medications prior to your next appointment, please contact your pharmacy*  Follow-Up: Call back or seek an in-person evaluation if the symptoms worsen or if the condition fails to improve as anticipated.  [facility] [phone]  Other Instructions  -given post 48 hours and on going redness and swelling will treat for skin infection secondary to sting -topical steroid for itching at sight.  -encouraged not to scratch it  -keep area clean -follow up if not improving    If you have been instructed to have an in-person evaluation today at a local Urgent Care facility, please use the link below. It will take you to a list of all of  our available [facility], including address, phone number and hours of operation. Please do not delay care.  [facility]  If you or a family member do not have a primary care provider, use the link below to schedule a visit and establish care. When you choose a [facility] primary care physician or advanced practice provider, you gain a long-term partner in health. Find a Primary Care Provider  Learn more about [facility]'s in-office and virtual care options:  - Get Care Now

## 2023-08-24 NOTE — Progress Notes (Signed)
 Virtual Visit Consent   Robert [name], you are scheduled for a virtual visit with a [facility] provider today. Just as with appointments in the office, your consent must be obtained to participate. Your consent will be active for this visit and any virtual visit you may have with one of our providers in the next 365 days. If you have a MyChart account, a copy of this consent can be sent to you electronically.  As this is a virtual visit, video technology does not allow for your provider to perform a traditional examination. This may limit your provider's ability to fully assess your condition. If your provider identifies any concerns that need to be evaluated in person or the need to arrange testing (such as labs, EKG, etc.), we will make arrangements to do so. Although advances in technology are sophisticated, we cannot ensure that it will always work on either your end or our end. If the connection with a video visit is poor, the visit may have to be switched to a telephone visit. With either a video or telephone visit, we are not always able to ensure that we have a secure connection.  By engaging in this virtual visit, you consent to the provision of healthcare and authorize for your insurance to be billed (if applicable) for the services provided during this visit. Depending on your insurance coverage, you may receive a charge related to this service.  I need to obtain your verbal consent now. Are you willing to proceed with your visit today? Robert [name] has provided verbal consent on 08/24/2023 for a virtual visit (video or telephone). Lanetta Pion, NP  Date: 08/24/2023 11:29 AM   Virtual Visit via Video Note   I, Lanetta Pion, connected with  Robert [name]  ([id number], 01-29-1976) on 08/24/23 at 11:30 AM EDT by a video-enabled telemedicine application and verified that I am speaking with the correct person using two identifiers.  Location: Patient: Virtual Visit Location  Patient: Home Provider: Virtual Visit Location Provider: Home Office   I discussed the limitations of evaluation and management by telemedicine and the availability of in person appointments. The patient expressed understanding and agreed to proceed.    History of Present Illness: Robert [name] is a 48 y.o. who identifies as a male who was assigned male at birth, and is being seen today for wasp sting   Onset was Sunday was stung by a wasp- itching and swelling Associated symptoms are swelling, itching continues, redness is spreading and brighter and warmth Modifying factors are cleaning the area Denies chest pain, shortness of breath, fevers, chills, drainage   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   Lumbar back pain with radiculopathy affecting right lower extremity 08/29/2016   Wart 02/27/2015   Volar plate injury of finger 01/09/2015   Depression, major 09/27/2014   Rash 09/27/2014   Erectile dysfunction 09/27/2014   Vitamin D  deficiency 07/23/2014   Post-surgical hypothyroidism 06/20/2014   S/P partial thyroidectomy 02/28/2014   Anxiety state 01/11/2014   Obesity (BMI 30-39.9) 01/11/2014   Cyst of mandible 12/22/2013   ADHD (attention deficit hyperactivity disorder) 06/02/2011    Allergies:  Allergies  Allergen Reactions   Penicillins Anaphylaxis    Did it involve swelling of the face/tongue/throat, SOB, or low BP? Yes Did it involve sudden or severe rash/hives, skin peeling, or any reaction on the inside of your mouth or nose? Yes Did you need to seek medical attention at a hospital or doctor's office? Yes When did it last  happen?      67-48 years old If all above answers are "NO", may proceed with cephalosporin use.    Topamax  Other (See Comments)    FACE BILATERAL NUMBNESS   Milk-Related Compounds Other (See Comments)    GI issues Pt reports that he cannot drink milk but can tolerate other dairy products   Morphine  And Codeine  Itching    Full body itching   Medications:  Current Outpatient Medications:    amphetamine -dextroamphetamine (ADDERALL XR) 30 MG 24 hr capsule, Take 30 mg by mouth daily. , Disp: , Rfl:    ARMOUR THYROID  120 MG tablet, Take 120 mg by mouth daily before breakfast. , Disp: , Rfl:    Ascorbic Acid  (VITAMIN C) 1000 MG tablet, Take 1,000 mg by mouth daily., Disp: , Rfl:    Cholecalciferol  (VITAMIN D3 PO), Take 1 capsule by mouth daily., Disp: , Rfl:    ketoconazole  (NIZORAL ) 2 % cream, Apply 1 Application topically 2 (two) times daily. Athletes foot treatment for minimum 2 weeks, Disp: 60 g, Rfl: 2   SUMAtriptan  (IMITREX ) 50 MG tablet, Take 1 tablet (50 mg total) by mouth every 2 (two) hours as needed for migraine. May repeat in 2 hours if headache persists or recurs., Disp: 90 tablet, Rfl: 3   tadalafil  (CIALIS ) 20 MG tablet, Take 0.5-1 tablets (10-20 mg total) by mouth every other day as needed for erectile dysfunction., Disp: 5 tablet, Rfl: 2   tamsulosin  (FLOMAX ) 0.4 MG CAPS capsule, Take 0.4 mg by mouth daily., Disp: , Rfl:    tirzepatide  (MOUNJARO ) 12.5 MG/0.5ML Pen, Inject 12.5 mg into the skin once a week., Disp: 2 mL, Rfl: 2  Observations/Objective: Patient is well-developed, well-nourished in no acute distress.  Resting comfortably  at home.  Head is normocephalic, atraumatic.  No labored breathing.  Speech is clear and coherent with logical content.  Patient is alert and oriented at baseline.  Noted sting at right ankle, redness and swelling seen   Assessment and Plan:  1. Cellulitis, unspecified cellulitis site (Primary)  - sulfamethoxazole -trimethoprim  (BACTRIM  DS)  800-160 MG tablet; Take 1 tablet by mouth 2 (two) times daily for 7 days.  Dispense: 14 tablet; Refill: 0 - triamcinolone  cream (KENALOG ) 0.1 %; Apply 1 Application topically 2 (two) times daily.  Dispense: 30 g; Refill: 0  2. Redness   3. Wasp sting, accidental or unintentional, initial encounter  -given post 48 hours and on going redness and swelling will treat for skin infection secondary to sting -topical steroid for itching at sight.  -encouraged not to scratch it  -keep area clean -follow up if not improving   Reviewed side effects, risks and benefits of medication.    Patient acknowledged agreement and understanding of the plan.   Past Medical, Surgical, Social History, Allergies, and Medications have been Reviewed.   Follow Up Instructions: I discussed the assessment and treatment plan with the patient. The patient was provided an opportunity to ask questions and all were answered. The patient agreed with the plan and demonstrated an understanding of the instructions.  A copy of instructions were sent to the patient via MyChart unless otherwise  noted below.    The patient was advised to call back or seek an in-person evaluation if the symptoms worsen or if the condition fails to improve as anticipated.    Lanetta Pion, NP

## 2023-08-25 ENCOUNTER — Encounter: Payer: Self-pay | Admitting: Family Medicine

## 2023-08-26 ENCOUNTER — Ambulatory Visit: Payer: Self-pay

## 2023-08-26 ENCOUNTER — Encounter: Payer: Self-pay | Admitting: Family Medicine

## 2023-08-26 ENCOUNTER — Ambulatory Visit: Admitting: Family

## 2023-08-26 ENCOUNTER — Ambulatory Visit: Admitting: Family Medicine

## 2023-08-26 VITALS — BP 128/82 | HR 81 | Temp 98.0°F | Ht 75.0 in | Wt 292.2 lb

## 2023-08-26 DIAGNOSIS — M79604 Pain in right leg: Secondary | ICD-10-CM | POA: Diagnosis not present

## 2023-08-26 DIAGNOSIS — T63461A Toxic effect of venom of wasps, accidental (unintentional), initial encounter: Secondary | ICD-10-CM

## 2023-08-26 MED ORDER — EPINEPHRINE 0.3 MG/0.3ML IJ SOAJ: 0.3000 mg | INTRAMUSCULAR | 1 refills | Status: AC | PRN

## 2023-08-26 MED ORDER — PREDNISONE 20 MG PO TABS: 40.0000 mg | ORAL_TABLET | Freq: Every day | ORAL | 0 refills | Status: AC

## 2023-08-26 NOTE — Telephone Encounter (Signed)
 Copied from CRM [id number]. Topic: Clinical - Red Word Triage >> Aug 26, 2023  8:34 AM [place] A wrote: [facility] that prompted transfer to Nurse Triage: Patient has wasp bite on right ankle. Blood Pressure is elevated and headaches for three days since bite.

## 2023-08-26 NOTE — Progress Notes (Signed)
 Subjective:     Patient ID: Robert [name], male    DOB: 12/21/1975, 48 y.o.   MRN: [id number]  Chief Complaint  Patient presents with   Insect Bite    Pt stated got sting by a wasp on Sunday red swelling and itchy states has had headache for three days as well on right ankle.    HPI Discussed the use of AI scribe software for clinical note transcription with the patient, who gave verbal consent to proceed.  History of Present Illness SYLER [name] is a 48 year old male who presents with a severe reaction to a wasp sting on the right ankle.  He was stung by a wasp on the right inside of the ankle on Sunday, Aug 22, 2023. He has been stung by wasps before but never experienced a reaction of this severity. Initially, he used a device called a 'bug bite thing' to extract venom and applied Dermoplast, a menthol  lidocaine  spray, to numb the pain. By Tuesday, the sting site had become increasingly red and swollen, prompting an urgent care e-visit where he was prescribed Bactrim  and a steroid cream. Despite these treatments, the area continued to worsen, with redness and swelling spreading, and he has been documenting the progression with photographs. The sting site is painful and itchy, and he has difficulty bearing weight on the affected leg, often standing on one leg to alleviate discomfort. He applied ice to the area on Monday and Tuesday, which provided some relief.  He reports a persistent headache since the day of the sting, described as sharp shooting pain that began shortly after the sting and has persisted despite taking 2400 mg of Advil daily, which has provided no relief. Typically, Advil is effective for his headaches, but not in this instance. He has not used Imitrex  for the headache as it does not feel like his typical migraine, which usually presents with pain behind the eye. He experienced a sensation of the venom 'shooting up' his leg shortly after the sting, followed by similar  sharp pain in his head. No shortness of breath or fever, but he noted an elevated blood pressure reading of 161/94, which is higher than his usual 120/80 range.    There are no preventive care reminders to display for this patient.  Past Medical History:  Diagnosis Date   ADD (attention deficit disorder) without hyperactivity    according to his mother/ work test possible   Anxiety    Chronic sinusitis    Depression    Headache    History of kidney stones    passed stone   Hyperlipidemia    no meds, diet controlled   Hypertension    recently dx 2 months ago, MD just monitoring, no meds at this time   Hypothyroidism    Sleep apnea    Uses CPAP nightly.    Past Surgical History:  Procedure Laterality Date   HERNIA REPAIR     pt. was an infant when this surgery occured   NASAL SINUS SURGERY     THYROID  SURGERY Right 02/28/2014   DR TEOH    PARTIAL THYROIDECTOMY   THYROIDECTOMY Right 02/28/2014   Procedure: RIGHT HEMI THYROIDECTOMY;  Surgeon: Lawence Press, MD;  Location: [facility] OR;  Service: ENT;  Laterality: Right;   WISDOM TOOTH EXTRACTION       Current Outpatient Medications:    amphetamine -dextroamphetamine (ADDERALL XR) 30 MG 24 hr capsule, Take 30 mg by mouth daily. , Disp: ,  Rfl:    ARMOUR THYROID  120 MG tablet, Take 120 mg by mouth daily before breakfast. , Disp: , Rfl:    Ascorbic Acid  (VITAMIN C) 1000 MG tablet, Take 1,000 mg by mouth daily., Disp: , Rfl:    Cholecalciferol  (VITAMIN D3 PO), Take 1 capsule by mouth daily., Disp: , Rfl:    EPINEPHrine  0.3 mg/0.3 mL IJ SOAJ injection, Inject 0.3 mg into the muscle as needed for anaphylaxis., Disp: 1 each, Rfl: 1   ketoconazole  (NIZORAL ) 2 % cream, Apply 1 Application topically 2 (two) times daily. Athletes foot treatment for minimum 2 weeks, Disp: 60 g, Rfl: 2   predniSONE  (DELTASONE ) 20 MG tablet, Take 2 tablets (40 mg total) by mouth daily with breakfast for 5 days., Disp: 10 tablet, Rfl: 0   sulfamethoxazole -trimethoprim   (BACTRIM  DS) 800-160 MG tablet, Take 1 tablet by mouth 2 (two) times daily for 7 days., Disp: 14 tablet, Rfl: 0   SUMAtriptan  (IMITREX ) 50 MG tablet, Take 1 tablet (50 mg total) by mouth every 2 (two) hours as needed for migraine. May repeat in 2 hours if headache persists or recurs., Disp: 90 tablet, Rfl: 3   tadalafil  (CIALIS ) 20 MG tablet, Take 0.5-1 tablets (10-20 mg total) by mouth every other day as needed for erectile dysfunction., Disp: 5 tablet, Rfl: 2   tamsulosin  (FLOMAX ) 0.4 MG CAPS capsule, Take 0.4 mg by mouth daily., Disp: , Rfl:    tirzepatide  (MOUNJARO ) 12.5 MG/0.5ML Pen, Inject 12.5 mg into the skin once a week., Disp: 2 mL, Rfl: 2   triamcinolone  cream (KENALOG ) 0.1 %, Apply 1 Application topically 2 (two) times daily., Disp: 30 g, Rfl: 0  Allergies  Allergen Reactions   Penicillins Anaphylaxis    Did it involve swelling of the face/tongue/throat, SOB, or low BP? Yes Did it involve sudden or severe rash/hives, skin peeling, or any reaction on the inside of your mouth or nose? Yes Did you need to seek medical attention at a hospital or doctor's office? Yes When did it last happen?      81-89 years old If all above answers are "NO", may proceed with cephalosporin use.    Topamax  Other (See Comments)    FACE BILATERAL NUMBNESS   Milk-Related Compounds Other (See Comments)    GI issues Pt reports that he cannot drink milk but can tolerate other dairy products   Morphine  And Codeine  Itching    Full body itching   ROS neg/noncontributory except as noted HPI/below      Objective:      BP 128/82 (BP Location: Left Arm, Patient Position: Sitting, Cuff Size: Large)   Pulse 81   Temp 98 F (36.7 C) (Temporal)   Ht 6\' 3"  (1.905 m)   Wt 292 lb 3.2 oz (132.5 kg)   SpO2 98%   BMI 36.52 kg/m  Wt Readings from Last 3 Encounters:  08/26/23 292 lb 3.2 oz (132.5 kg)  07/29/23 292 lb 9.6 oz (132.7 kg)  07/01/23 283 lb (128.4 kg)    Physical Exam   Gen: WDWN NAD HEENT:  NCAT, conjunctiva not injected, sclera nonicteric CARDIAC: RRR, S1S2+, no murmur.  LUNGS: CTAB. No wheezes EXT:  tr BLE edema MSK: no gross abnormalities.  NEURO: A&O x3.  CN II-XII intact.  PSYCH: normal mood. Good eye contact       Assessment & Plan:  Wasp sting, accidental or unintentional, initial encounter  Pain of right lower extremity  Other orders -     predniSONE ; Take  2 tablets (40 mg total) by mouth daily with breakfast for 5 days.  Dispense: 10 tablet; Refill: 0 -     EPINEPHrine ; Inject 0.3 mg into the muscle as needed for anaphylaxis.  Dispense: 1 each; Refill: 1  Assessment and Plan Assessment & Plan Allergic reaction to wasp sting   He experienced an acute allergic reaction to a wasp sting on the right inside of his ankle on Aug 22, 2023. Symptoms include redness, swelling, pain, and itching, with progression and spreading, possibly involving blood vessels. There are no signs of systemic infection, but pain prevents weight-bearing on the affected leg. Prescribe oral steroids to reduce inflammation and allergic response. Continue Bactrim  as previously prescribed by urgent care. Prescribe an EpiPen  for future severe allergic reactions. Advise against ibuprofen while on steroids.  Headache   He has a persistent headache since Aug 22, 2023, following the wasp sting. Initially sharp shooting pain, it has evolved into a constant headache unresponsive to 2400 mg of Advil daily. The headache onset predates Bactrim  use, suggesting it is not a side effect. Differential includes headache due to allergic reaction or elevated blood pressure, with severity rated as 9 out of 10. Initiate oral steroids, which may help alleviate headache symptoms. Advise use of Tylenol  for headache management, avoiding ibuprofen while on steroids.  Elevated blood pressure   Transient elevation in blood pressure was noted at 161/94, likely secondary to pain and headache. His usual blood pressure is around  120/80, with a current reading of 128/82 indicating improvement. Consideration of Adderall contributing to elevated blood pressure, but advise skipping Adderall for a few days if possible to assess impact on blood pressure.    Return if symptoms worsen or fail to improve.  Ellsworth Haas, MD

## 2023-08-26 NOTE — Telephone Encounter (Signed)
 Copied from CRM [id number]. Topic: Clinical - Red Word Triage >> Aug 26, 2023  8:34 AM [place] A wrote: [facility] that prompted transfer to Nurse Triage: Patient has wasp bite on right ankle. Blood Pressure is elevated and headaches for three days since bite.   Chief Complaint: wasp bite Symptoms: red streaks, swelling, headache, and fatigue Frequency: constant Pertinent Negatives: Patient denies fever Disposition: [] ED /[] Urgent Care (no appt availability in office) / [] Appointment(In office/virtual)/ []  [facility]/ [] Home Care/ [] Refused Recommended Disposition /[] [facility]/ []  Follow-up with PCP Additional Notes: virtual visit done on 5/27, started on bactrim  and triamcinolone  but says the swelling and pain is getting worse. Pt also endorses headache, fatigues, and says his BP has been elevated altely. Appt scheduled with Dr. Waldo Guitar today at [phone]  Reason for Disposition  [1] Red streak or red line AND [2] length > 2 inches (5 cm)  Answer Assessment - Initial Assessment Questions 1. TYPE of INSECT: "What type of insect was it?"      Wasp bite  2. ONSET: "When did you get bitten?"      4 days ago  3. LOCATION: "Where is the insect bite located?"      Right ankle  4. REDNESS: "Is the area red or pink?" If Yes, ask: "What size is area of redness?" (inches or cm). "When did the redness start?"     Red, getting bigger with red streaks now  5. PAIN: "Is there any pain?" If Yes, ask: "How bad is it?"  (Scale 1-10; or mild, moderate, severe)     7/10 pain yesterday  6. ITCHING: "Does it itch?" If Yes, ask: "How bad is the itch?"    - MILD: doesn't interfere with normal activities   - MODERATE-SEVERE: interferes with work, school, sleep, or other activities      Mild to moderate itching that comes and goes  7. SWELLING: "How big is the swelling?" (inches, cm, or compare to coins)     Yes, the size of a dollar  8. OTHER SYMPTOMS: "Do you have any other symptoms?"   (e.g., difficulty breathing, hives)     Headache and elevated BP  Protocols used: Insect Bite-A-AH

## 2023-08-26 NOTE — Patient Instructions (Signed)
 Keep leg elevated.   Tylenol .   No ibuprofen.

## 2023-08-26 NOTE — Telephone Encounter (Signed)
  duplicate    Reason for Disposition . Caller has already spoken with another triager and has no further questions.  Protocols used: No Contact or Duplicate Contact Call-A-AH

## 2024-03-17 ENCOUNTER — Telehealth

## 2024-03-17 DIAGNOSIS — J069 Acute upper respiratory infection, unspecified: Secondary | ICD-10-CM

## 2024-03-17 DIAGNOSIS — B9689 Other specified bacterial agents as the cause of diseases classified elsewhere: Secondary | ICD-10-CM | POA: Diagnosis not present

## 2024-03-17 MED ORDER — ONDANSETRON 4 MG PO TBDP: 4.0000 mg | ORAL_TABLET | Freq: Three times a day (TID) | ORAL | 0 refills | Status: AC | PRN

## 2024-03-17 MED ORDER — PSEUDOEPH-BROMPHEN-DM 30-2-10 MG/5ML PO SYRP: 5.0000 mL | ORAL_SOLUTION | Freq: Four times a day (QID) | ORAL | 0 refills | Status: AC | PRN

## 2024-03-17 MED ORDER — DOXYCYCLINE HYCLATE 100 MG PO TABS: 100.0000 mg | ORAL_TABLET | Freq: Two times a day (BID) | ORAL | 0 refills | Status: AC

## 2024-03-17 NOTE — Progress Notes (Signed)
 " Virtual Visit Consent   Robert [name], you are scheduled for a virtual visit with a [facility] provider today. Just as with appointments in the office, your consent must be obtained to participate. Your consent will be active for this visit and any virtual visit you may have with one of our providers in the next 365 days. If you have a MyChart account, a copy of this consent can be sent to you electronically.  As this is a virtual visit, video technology does not allow for your provider to perform a traditional examination. This may limit your provider's ability to fully assess your condition. If your provider identifies any concerns that need to be evaluated in person or the need to arrange testing (such as labs, EKG, etc.), we will make arrangements to do so. Although advances in technology are sophisticated, we cannot ensure that it will always work on either your end or our end. If the connection with a video visit is poor, the visit may have to be switched to a telephone visit. With either a video or telephone visit, we are not always able to ensure that we have a secure connection.  By engaging in this virtual visit, you consent to the provision of healthcare and authorize for your insurance to be billed (if applicable) for the services provided during this visit. Depending on your insurance coverage, you may receive a charge related to this service.  I need to obtain your verbal consent now. Are you willing to proceed with your visit today? Robert [name] has provided verbal consent on 03/17/2024 for a virtual visit (video or telephone). Delon CHRISTELLA Dickinson, PA-C  Date: 03/17/2024 9:59 AM   Virtual Visit via Video Note   I, Delon CHRISTELLA Dickinson, connected with  Robert [name]  ([id number], 07/01/1975) on 03/17/2024 at  9:45 AM EST by a video-enabled telemedicine application and verified that I am speaking with the correct person using two identifiers.  Location: Patient: Virtual Visit  Location Patient: Home Provider: Virtual Visit Location Provider: Home Office   I discussed the limitations of evaluation and management by telemedicine and the availability of in person appointments. The patient expressed understanding and agreed to proceed.    History of Present Illness: Robert [name] is a 48 y.o. who identifies as a male who was assigned male at birth, and is being seen today for URI symptoms.  HPI: URI  This is a new problem. The current episode started 1 to 4 weeks ago (almost a month). The problem has been unchanged. There has been no fever. Associated symptoms include congestion, coughing, diarrhea (2 days ago), headaches, a plugged ear sensation, rhinorrhea (and post nasal drainage), sinus pain (waxes and wanes), a sore throat (noticing the last couple days) and vomiting. Pertinent negatives include no ear pain, nausea or wheezing. He has tried decongestant (cough drops, mylanta) for the symptoms. The treatment provided no relief.    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   Lumbar back pain with radiculopathy affecting right lower extremity 08/29/2016   Wart 02/27/2015   Volar plate injury of finger 01/09/2015   Depression, major 09/27/2014   Rash 09/27/2014   Erectile dysfunction  09/27/2014   Vitamin D  deficiency 07/23/2014   Post-surgical hypothyroidism 06/20/2014   S/P partial thyroidectomy 02/28/2014   Anxiety state 01/11/2014   Obesity (BMI 30-39.9) 01/11/2014   Cyst of mandible 12/22/2013   ADHD (attention deficit hyperactivity disorder) 06/02/2011    Allergies: Allergies[1] Medications: Current  Medications[2]  Observations/Objective: Patient is well-developed, well-nourished in no acute distress.  Resting comfortably  Head is normocephalic, atraumatic.  No labored breathing.  Speech is clear and coherent with logical content.  Patient is alert and oriented at baseline.    Assessment and Plan: 1. Bacterial upper respiratory infection (Primary) - doxycycline  (VIBRA -TABS) 100 MG tablet; Take 1 tablet (100 mg total) by mouth 2 (two) times daily.  Dispense: 14 tablet; Refill: 0 - brompheniramine-pseudoephedrine-DM 30-2-10 MG/5ML syrup; Take 5 mLs by mouth 4 (four) times daily as needed.  Dispense: 120 mL; Refill: 0 - ondansetron  (ZOFRAN -ODT) 4 MG disintegrating tablet; Take 1 tablet (4 mg total) by mouth every 8 (eight) hours as needed.  Dispense: 20 tablet; Refill: 0  - Worsening over a week despite OTC medications - Will treat with Doxycycline  - Bromfed DM added for cough and congestion - Zofran  added for nausea - Push fluids.  - Rest.  - Steam and humidifier can help - Seek in person evaluation if worsening or symptoms fail to improve    Follow Up Instructions: I discussed the assessment and treatment plan with the patient. The patient was provided an opportunity to ask questions and all were answered. The patient agreed with the plan and demonstrated an understanding of the instructions.  A copy of instructions were sent to the patient via MyChart unless otherwise noted below.    The patient was advised to call back or seek an in-person evaluation if the symptoms worsen or if the condition fails to improve as anticipated.    Delon CHRISTELLA Dickinson, PA-C     [1]  Allergies Allergen Reactions   Penicillins Anaphylaxis    Did it involve swelling of the face/tongue/throat, SOB, or low BP? Yes Did it involve sudden or severe rash/hives, skin peeling, or any reaction on the inside of your mouth or nose? Yes Did you need to seek medical attention at a hospital or doctor's  office? Yes When did it last happen?      53-48 years old If all above answers are NO, may proceed with cephalosporin use.    Topamax  Other (See Comments)    FACE BILATERAL NUMBNESS   Milk-Related Compounds Other (See Comments)    GI issues Pt reports that he cannot drink milk but can tolerate other dairy products   Morphine  And Codeine  Itching    Full body itching  [2]  Current Outpatient Medications:    brompheniramine-pseudoephedrine-DM 30-2-10 MG/5ML syrup, Take 5 mLs by mouth 4 (four) times daily as needed., Disp: 120 mL, Rfl: 0   doxycycline  (VIBRA -TABS) 100 MG tablet, Take 1 tablet (100 mg total) by mouth 2 (two) times daily., Disp: 14 tablet, Rfl: 0   ondansetron  (ZOFRAN -ODT) 4 MG disintegrating tablet, Take 1 tablet (4 mg total) by mouth every 8 (eight) hours as needed., Disp: 20 tablet, Rfl: 0   amphetamine -dextroamphetamine (ADDERALL XR) 30 MG 24 hr capsule, Take 30 mg by mouth daily. , Disp: , Rfl:    ARMOUR THYROID  120 MG tablet, Take 120 mg by mouth daily before breakfast. , Disp: , Rfl:    Ascorbic Acid  (VITAMIN C) 1000 MG tablet, Take 1,000 mg by mouth daily., Disp: , Rfl:  Cholecalciferol  (VITAMIN D3 PO), Take 1 capsule by mouth daily., Disp: , Rfl:    EPINEPHrine  0.3 mg/0.3 mL IJ SOAJ injection, Inject 0.3 mg into the muscle as needed for anaphylaxis., Disp: 1 each, Rfl: 1   ketoconazole  (NIZORAL ) 2 % cream, Apply 1 Application topically 2 (two) times daily. Athletes foot treatment for minimum 2 weeks, Disp: 60 g, Rfl: 2   SUMAtriptan  (IMITREX ) 50 MG tablet, Take 1 tablet (50 mg total) by mouth every 2 (two) hours as needed for migraine. May repeat in 2 hours if headache persists or recurs., Disp: 90 tablet, Rfl: 3   tadalafil  (CIALIS ) 20 MG tablet, Take 0.5-1 tablets (10-20 mg total) by mouth every other day as needed for erectile dysfunction., Disp: 5 tablet, Rfl: 2   tamsulosin  (FLOMAX ) 0.4 MG CAPS capsule, Take 0.4 mg by mouth daily., Disp: , Rfl:    tirzepatide   (MOUNJARO ) 12.5 MG/0.5ML Pen, Inject 12.5 mg into the skin once a week., Disp: 2 mL, Rfl: 2   triamcinolone  cream (KENALOG ) 0.1 %, Apply 1 Application topically 2 (two) times daily., Disp: 30 g, Rfl: 0  "

## 2024-03-17 NOTE — Patient Instructions (Signed)
 " Robert [name], thank you for joining Delon CHRISTELLA Dickinson, PA-C for today's virtual visit.  While this provider is not your primary care provider (PCP), if your PCP is located in our provider database this encounter information will be shared with them immediately following your visit.   A [facility] MyChart account gives you access to today's visit and all your visits, tests, and labs performed at [facility]  click here if you don't have a  MyChart account or go to [email or web address]  Consent: (Patient) Robert [name] provided verbal consent for this virtual visit at the beginning of the encounter.  Current Medications:  Current Outpatient Medications:    brompheniramine-pseudoephedrine-DM 30-2-10 MG/5ML syrup, Take 5 mLs by mouth 4 (four) times daily as needed., Disp: 120 mL, Rfl: 0   doxycycline  (VIBRA -TABS) 100 MG tablet, Take 1 tablet (100 mg total) by mouth 2 (two) times daily., Disp: 14 tablet, Rfl: 0   ondansetron  (ZOFRAN -ODT) 4 MG disintegrating tablet, Take 1 tablet (4 mg total) by mouth every 8 (eight) hours as needed., Disp: 20 tablet, Rfl: 0   amphetamine -dextroamphetamine (ADDERALL XR) 30 MG 24 hr capsule, Take 30 mg by mouth daily. , Disp: , Rfl:    ARMOUR THYROID  120 MG tablet, Take 120 mg by mouth daily before breakfast. , Disp: , Rfl:    Ascorbic Acid  (VITAMIN C) 1000 MG tablet, Take 1,000 mg by mouth daily., Disp: , Rfl:    Cholecalciferol  (VITAMIN D3 PO), Take 1 capsule by mouth daily., Disp: , Rfl:    EPINEPHrine  0.3 mg/0.3 mL IJ SOAJ injection, Inject 0.3 mg into the muscle as needed for anaphylaxis., Disp: 1 each, Rfl: 1   ketoconazole  (NIZORAL ) 2 % cream, Apply 1 Application topically 2 (two) times daily. Athletes foot treatment for minimum 2 weeks, Disp: 60 g, Rfl: 2   SUMAtriptan  (IMITREX ) 50 MG tablet, Take 1 tablet (50 mg total) by mouth every 2 (two) hours as needed for migraine. May repeat in 2 hours if headache persists or  recurs., Disp: 90 tablet, Rfl: 3   tadalafil  (CIALIS ) 20 MG tablet, Take 0.5-1 tablets (10-20 mg total) by mouth every other day as needed for erectile dysfunction., Disp: 5 tablet, Rfl: 2   tamsulosin  (FLOMAX ) 0.4 MG CAPS capsule, Take 0.4 mg by mouth daily., Disp: , Rfl:    tirzepatide  (MOUNJARO ) 12.5 MG/0.5ML Pen, Inject 12.5 mg into the skin once a week., Disp: 2 mL, Rfl: 2   triamcinolone  cream (KENALOG ) 0.1 %, Apply 1 Application topically 2 (two) times daily., Disp: 30 g, Rfl: 0   Medications ordered in this encounter:  Meds ordered this encounter  Medications   doxycycline  (VIBRA -TABS) 100 MG tablet    Sig: Take 1 tablet (100 mg total) by mouth 2 (two) times daily.    Dispense:  14 tablet    Refill:  0    Supervising Provider:   LAMPTEY, PHILIP O [[id number]]   brompheniramine-pseudoephedrine-DM 30-2-10 MG/5ML syrup    Sig: Take 5 mLs by mouth 4 (four) times daily as needed.    Dispense:  120 mL    Refill:  0    Supervising Provider:   BLAISE ALEENE KIDD [[id number]]   ondansetron  (ZOFRAN -ODT) 4 MG disintegrating tablet    Sig: Take 1 tablet (4 mg total) by mouth every 8 (eight) hours as needed.    Dispense:  20 tablet    Refill:  0    Supervising Provider:   BLAISE ALEENE KIDD [phone]     *  If you need refills on other medications prior to your next appointment, please contact your pharmacy*  Follow-Up: Call back or seek an in-person evaluation if the symptoms worsen or if the condition fails to improve as anticipated.  [facility] [phone]  Other Instructions Upper Respiratory Infection, Adult An upper respiratory infection (URI) is a common viral infection of the nose, throat, and upper air passages that lead to the lungs. The most common type of URI is the common cold. URIs usually get better on their own, without medical treatment. What are the causes? A URI is caused by a virus. You may catch a virus by: Breathing in droplets from an infected person's  cough or sneeze. Touching something that has been exposed to the virus (is contaminated) and then touching your mouth, nose, or eyes. What increases the risk? You are more likely to get a URI if: You are very young or very old. You have close contact with others, such as at work, school, or a health care facility. You smoke. You have long-term (chronic) heart or lung disease. You have a weakened disease-fighting system (immune system). You have nasal allergies or asthma. You are experiencing a lot of stress. You have poor nutrition. What are the signs or symptoms? A URI usually involves some of the following symptoms: Runny or stuffy (congested) nose. Cough. Sneezing. Sore throat. Headache. Fatigue. Fever. Loss of appetite. Pain in your forehead, behind your eyes, and over your cheekbones (sinus pain). Muscle aches. Redness or irritation of the eyes. Pressure in the ears or face. How is this diagnosed? This condition may be diagnosed based on your medical history and symptoms, and a physical exam. Your health care provider may use a swab to take a mucus sample from your nose (nasal swab). This sample can be tested to determine what virus is causing the illness. How is this treated? URIs usually get better on their own within 7-10 days. Medicines cannot cure URIs, but your health care provider may recommend certain medicines to help relieve symptoms, such as: Over-the-counter cold medicines. Cough suppressants. Coughing is a type of defense against infection that helps to clear the respiratory system, so take these medicines only as recommended by your health care provider. Fever-reducing medicines. Follow these instructions at home: Activity Rest as needed. If you have a fever, stay home from work or school until your fever is gone or until your health care provider says your URI cannot spread to other people (is no longer contagious). Your health care provider may have you wear a  face mask to prevent your infection from spreading. Relieving symptoms Gargle with a mixture of salt and water 3-4 times a day or as needed. To make salt water, completely dissolve -1 tsp (3-6 g) of salt in 1 cup (237 mL) of warm water. Use a cool-mist humidifier to add moisture to the air. This can help you breathe more easily. Eating and drinking  Drink enough fluid to keep your urine pale yellow. Eat soups and other clear broths. General instructions  Take over-the-counter and prescription medicines only as told by your health care provider. These include cold medicines, fever reducers, and cough suppressants. Do not use any products that contain nicotine or tobacco. These products include cigarettes, chewing tobacco, and vaping devices, such as e-cigarettes. If you need help quitting, ask your health care provider. Stay away from secondhand smoke. Stay up to date on all immunizations, including the yearly (annual) flu vaccine. Keep all follow-up  visits. This is important. How to prevent the spread of infection to others URIs can be contagious. To prevent the infection from spreading: Wash your hands with soap and water for at least 20 seconds. If soap and water are not available, use hand sanitizer. Avoid touching your mouth, face, eyes, or nose. Cough or sneeze into a tissue or your sleeve or elbow instead of into your hand or into the air.  Contact a health care provider if: You are getting worse instead of better. You have a fever or chills. Your mucus is brown or red. You have yellow or brown discharge coming from your nose. You have pain in your face, especially when you bend forward. You have swollen neck glands. You have pain while swallowing. You have white areas in the back of your throat. Get help right away if: You have shortness of breath that gets worse. You have severe or persistent: Headache. Ear pain. Sinus pain. Chest pain. You have chronic lung disease  along with any of the following: Making high-pitched whistling sounds when you breathe, most often when you breathe out (wheezing). Prolonged cough (more than 14 days). Coughing up blood. A change in your usual mucus. You have a stiff neck. You have changes in your: Vision. Hearing. Thinking. Mood. These symptoms may be an emergency. Get help right away. Call 911. Do not wait to see if the symptoms will go away. Do not drive yourself to the hospital. Summary An upper respiratory infection (URI) is a common infection of the nose, throat, and upper air passages that lead to the lungs. A URI is caused by a virus. URIs usually get better on their own within 7-10 days. Medicines cannot cure URIs, but your health care provider may recommend certain medicines to help relieve symptoms. This information is not intended to replace advice given to you by your health care provider. Make sure you discuss any questions you have with your health care provider. Document Revised: 10/16/2020 Document Reviewed: 10/16/2020 Elsevier Patient Education  2024 Elsevier Inc.   If you have been instructed to have an in-person evaluation today at a local Urgent Care facility, please use the link below. It will take you to a list of all of our available [facility], including address, phone number and hours of operation. Please do not delay care.  [facility]  If you or a family member do not have a primary care provider, use the link below to schedule a visit and establish care. When you choose a [facility] primary care physician or advanced practice provider, you gain a long-term partner in health. Find a Primary Care Provider  Learn more about [facility] in-office and virtual care options:  - Get Care Now "

## 2024-04-24 ENCOUNTER — Encounter: Payer: Self-pay | Admitting: Family Medicine

## 2024-04-27 ENCOUNTER — Telehealth: Admitting: Physician Assistant

## 2024-04-27 DIAGNOSIS — Z20828 Contact with and (suspected) exposure to other viral communicable diseases: Secondary | ICD-10-CM

## 2024-04-27 DIAGNOSIS — Z20822 Contact with and (suspected) exposure to covid-19: Secondary | ICD-10-CM | POA: Diagnosis not present

## 2024-04-27 MED ORDER — OSELTAMIVIR PHOSPHATE 75 MG PO CAPS: 75.0000 mg | ORAL_CAPSULE | Freq: Two times a day (BID) | ORAL | 0 refills | Status: AC

## 2024-04-27 NOTE — Patient Instructions (Signed)
 " Robert [name], thank you for joining Lovette Borg, PA-C for today's virtual visit.  While this provider is not your primary care provider (PCP), if your PCP is located in our provider database this encounter information will be shared with them immediately following your visit.   A [facility] MyChart account gives you access to today's visit and all your visits, tests, and labs performed at [facility]  click here if you don't have a [facility] MyChart account or go to [email or web address]  Consent: (Patient) Robert [name] provided verbal consent for this virtual visit at the beginning of the encounter.  Current Medications:  Current Outpatient Medications:    oseltamivir  (TAMIFLU ) 75 MG capsule, Take 1 capsule (75 mg total) by mouth 2 (two) times daily for 5 days., Disp: 10 capsule, Rfl: 0   amphetamine -dextroamphetamine (ADDERALL XR) 30 MG 24 hr capsule, Take 30 mg by mouth daily. , Disp: , Rfl:    ARMOUR THYROID  120 MG tablet, Take 120 mg by mouth daily before breakfast. , Disp: , Rfl:    Ascorbic Acid  (VITAMIN C) 1000 MG tablet, Take 1,000 mg by mouth daily., Disp: , Rfl:    brompheniramine-pseudoephedrine-DM 30-2-10 MG/5ML syrup, Take 5 mLs by mouth 4 (four) times daily as needed., Disp: 120 mL, Rfl: 0   Cholecalciferol  (VITAMIN D3 PO), Take 1 capsule by mouth daily., Disp: , Rfl:    doxycycline  (VIBRA -TABS) 100 MG tablet, Take 1 tablet (100 mg total) by mouth 2 (two) times daily., Disp: 14 tablet, Rfl: 0   EPINEPHrine  0.3 mg/0.3 mL IJ SOAJ injection, Inject 0.3 mg into the muscle as needed for anaphylaxis., Disp: 1 each, Rfl: 1   ketoconazole  (NIZORAL ) 2 % cream, Apply 1 Application topically 2 (two) times daily. Athletes foot treatment for minimum 2 weeks, Disp: 60 g, Rfl: 2   ondansetron  (ZOFRAN -ODT) 4 MG disintegrating tablet, Take 1 tablet (4 mg total) by mouth every 8 (eight) hours as needed., Disp: 20 tablet, Rfl: 0   SUMAtriptan  (IMITREX ) 50 MG tablet,  Take 1 tablet (50 mg total) by mouth every 2 (two) hours as needed for migraine. May repeat in 2 hours if headache persists or recurs., Disp: 90 tablet, Rfl: 3   tadalafil  (CIALIS ) 20 MG tablet, Take 0.5-1 tablets (10-20 mg total) by mouth every other day as needed for erectile dysfunction., Disp: 5 tablet, Rfl: 2   tamsulosin  (FLOMAX ) 0.4 MG CAPS capsule, Take 0.4 mg by mouth daily., Disp: , Rfl:    tirzepatide  (MOUNJARO ) 12.5 MG/0.5ML Pen, Inject 12.5 mg into the skin once a week., Disp: 2 mL, Rfl: 2   triamcinolone  cream (KENALOG ) 0.1 %, Apply 1 Application topically 2 (two) times daily., Disp: 30 g, Rfl: 0   Medications ordered in this encounter:  Meds ordered this encounter  Medications   oseltamivir  (TAMIFLU ) 75 MG capsule    Sig: Take 1 capsule (75 mg total) by mouth 2 (two) times daily for 5 days.    Dispense:  10 capsule    Refill:  0    Supervising Provider:   BLAISE ALEENE KIDD [[id number]]     *If you need refills on other medications prior to your next appointment, please contact your pharmacy*  Follow-Up: Call back or seek an in-person evaluation if the symptoms worsen or if the condition fails to improve as anticipated.     Other Instructions Stay well hydrated. May start Tamiflu  as prescribed within the next 72 hours of starting of symptoms. Take Ibuprofen alternating with  Tylenol  for onset of body aches or fever. Wear a mask. Continue to watch for worsening symptoms. Schedule a virtual appointment or follow up at an urgent care clinic if symptoms don't improve.  Pt verbaliz   If you have been instructed to have an in-person evaluation today at a local Urgent Care facility, please use the link below. It will take you to a list of all of our available [facility], including address, phone number and hours of operation. Please do not delay care.  [facility]  If you or a family member do not have a primary care provider, use the link below to  schedule a visit and establish care. When you choose a [facility] primary care physician or advanced practice provider, you gain a long-term partner in health. Find a Primary Care Provider  Learn more about [facility] in-office and virtual care options: [facility] - Get Care Now  "

## 2024-04-27 NOTE — Progress Notes (Signed)
 " Virtual Visit Consent   Robert [name], you are scheduled for a virtual visit with a [facility] provider today. Just as with appointments in the office, your consent must be obtained to participate. Your consent will be active for this visit and any virtual visit you may have with one of our providers in the next 365 days. If you have a MyChart account, a copy of this consent can be sent to you electronically.  As this is a virtual visit, video technology does not allow for your provider to perform a traditional examination. This may limit your provider's ability to fully assess your condition. If your provider identifies any concerns that need to be evaluated in person or the need to arrange testing (such as labs, EKG, etc.), we will make arrangements to do so. Although advances in technology are sophisticated, we cannot ensure that it will always work on either your end or our end. If the connection with a video visit is poor, the visit may have to be switched to a telephone visit. With either a video or telephone visit, we are not always able to ensure that we have a secure connection.  By engaging in this virtual visit, you consent to the provision of healthcare and authorize for your insurance to be billed (if applicable) for the services provided during this visit. Depending on your insurance coverage, you may receive a charge related to this service.  I need to obtain your verbal consent now. Are you willing to proceed with your visit today? Robert [name] has provided verbal consent on 04/27/2024 for a virtual visit (video or telephone). Robert Borg, PA-C  Date: 04/27/2024 7:25 PM   Virtual Visit via Video Note   I, Robert [name], connected with  Robert [name]  ([id number], 08/07/1975) on 04/27/24 at  7:15 PM EST by a video-enabled telemedicine application and verified that I am speaking with the correct person using two identifiers.  Location: Patient: Virtual Visit Location Patient:  Home Provider: Virtual Visit Location Provider: Home Office   I discussed the limitations of evaluation and management by telemedicine and the availability of in person appointments. The patient expressed understanding and agreed to proceed.    History of Present Illness: Robert [name] is a 49 y.o. who identifies as a male who was assigned male at birth, and is being seen today for viral symptoms.  HPI: 48y/o M presents for a telehealth video visit for exposure to covid and flu by his daughter. Daughter tested positive for Flu B and Covid. Currently having symptoms which started earlier today with slight congestion, fatigue, malaise. Denies fever, cough, n/v, or body aches. Pt is not flu vaccinated.   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   Lumbar back pain with radiculopathy affecting right lower extremity 08/29/2016   Wart 02/27/2015   Volar plate injury of finger 01/09/2015   Depression, major 09/27/2014   Rash 09/27/2014   Erectile dysfunction 09/27/2014   Vitamin D  deficiency 07/23/2014   Post-surgical hypothyroidism 06/20/2014   S/P partial thyroidectomy 02/28/2014   Anxiety state 01/11/2014   Obesity (BMI 30-39.9) 01/11/2014   Cyst of mandible 12/22/2013  ADHD (attention deficit hyperactivity disorder) 06/02/2011    Allergies: Allergies[1] Medications: Current Medications[2]  Observations/Objective: Patient is well-developed, well-nourished in no acute distress.  Resting comfortably  at home.  Head is normocephalic, atraumatic.  No labored breathing.  Speech is clear and coherent with logical content.  Patient is alert and  oriented at baseline.    Assessment and Plan: 1. Exposure to the flu (Primary) - oseltamivir  (TAMIFLU ) 75 MG capsule; Take 1 capsule (75 mg total) by mouth 2 (two) times daily for 5 days.  Dispense: 10 capsule; Refill: 0  2. Close exposure to COVID-19 virus  Stay well hydrated. May start Tamiflu  as prescribed within the next 72 hours of starting of symptoms. Take Ibuprofen alternating with Tylenol  for onset of body aches or fever. Wear a mask. Continue to watch for worsening symptoms. Schedule a virtual appointment or follow up at an urgent care clinic if symptoms don't improve.  Pt verbalized understanding and in agreement.    Follow Up Instructions: I discussed the assessment and treatment plan with the patient. The patient was provided an opportunity to ask questions and all were answered. The patient agreed with the plan and demonstrated an understanding of the instructions.  A copy of instructions were sent to the patient via MyChart unless otherwise noted below.   Patient has requested to receive PHI (AVS, Work Notes, etc) pertaining to this video visit through e-mail as they are currently without active MyChart. They have voiced understand that email is not considered secure and their health information could be viewed by someone other than the patient.   The patient was advised to call back or seek an in-person evaluation if the symptoms worsen or if the condition fails to improve as anticipated.    Dmitry Macomber, PA-C    [1]  Allergies Allergen Reactions   Penicillins Anaphylaxis    Did it involve swelling of the face/tongue/throat, SOB, or low BP? Yes Did it involve sudden or severe rash/hives, skin peeling, or any reaction on the inside of your mouth or nose? Yes Did you need to seek medical attention at a hospital or doctor's office? Yes When did it last happen?      47-32 years old If all above answers are NO, may proceed with cephalosporin use.    Topamax  Other  (See Comments)    FACE BILATERAL NUMBNESS   Milk-Related Compounds Other (See Comments)    GI issues Pt reports that he cannot drink milk but can tolerate other dairy products   Morphine  And Codeine  Itching    Full body itching  [2]  Current Outpatient Medications:    oseltamivir  (TAMIFLU ) 75 MG capsule, Take 1 capsule (75 mg total) by mouth 2 (two) times daily for 5 days., Disp: 10 capsule, Rfl: 0   amphetamine -dextroamphetamine (ADDERALL XR) 30 MG 24 hr capsule, Take 30 mg by mouth daily. , Disp: , Rfl:    ARMOUR THYROID  120 MG tablet, Take 120 mg by mouth daily before breakfast. , Disp: , Rfl:    Ascorbic Acid  (VITAMIN C) 1000 MG tablet, Take 1,000 mg by mouth daily., Disp: , Rfl:    brompheniramine-pseudoephedrine-DM 30-2-10 MG/5ML syrup, Take 5 mLs by mouth 4 (four) times daily as needed., Disp: 120 mL, Rfl: 0   Cholecalciferol  (VITAMIN D3 PO), Take 1 capsule by mouth daily., Disp: , Rfl:    doxycycline  (VIBRA -TABS) 100 MG tablet, Take 1 tablet (100 mg total) by mouth 2 (two) times daily., Disp: 14 tablet, Rfl: 0  EPINEPHrine  0.3 mg/0.3 mL IJ SOAJ injection, Inject 0.3 mg into the muscle as needed for anaphylaxis., Disp: 1 each, Rfl: 1   ketoconazole  (NIZORAL ) 2 % cream, Apply 1 Application topically 2 (two) times daily. Athletes foot treatment for minimum 2 weeks, Disp: 60 g, Rfl: 2   ondansetron  (ZOFRAN -ODT) 4 MG disintegrating tablet, Take 1 tablet (4 mg total) by mouth every 8 (eight) hours as needed., Disp: 20 tablet, Rfl: 0   SUMAtriptan  (IMITREX ) 50 MG tablet, Take 1 tablet (50 mg total) by mouth every 2 (two) hours as needed for migraine. May repeat in 2 hours if headache persists or recurs., Disp: 90 tablet, Rfl: 3   tadalafil  (CIALIS ) 20 MG tablet, Take 0.5-1 tablets (10-20 mg total) by mouth every other day as needed for erectile dysfunction., Disp: 5 tablet, Rfl: 2   tamsulosin  (FLOMAX ) 0.4 MG CAPS capsule, Take 0.4 mg by mouth daily., Disp: , Rfl:    tirzepatide  (MOUNJARO )  12.5 MG/0.5ML Pen, Inject 12.5 mg into the skin once a week., Disp: 2 mL, Rfl: 2   triamcinolone  cream (KENALOG ) 0.1 %, Apply 1 Application topically 2 (two) times daily., Disp: 30 g, Rfl: 0  "

## 2024-07-31 ENCOUNTER — Encounter: Admitting: Family Medicine
